# Patient Record
Sex: Female | Born: 1935 | Race: White | Hispanic: No | Smoking: Former smoker
Health system: Southern US, Community
[De-identification: ages and names within clinical notes are randomized; demographics above are authoritative.]

## PROBLEM LIST (undated history)

## (undated) DIAGNOSIS — K219 Gastro-esophageal reflux disease without esophagitis: Secondary | ICD-10-CM

## (undated) DIAGNOSIS — J45909 Unspecified asthma, uncomplicated: Secondary | ICD-10-CM

## (undated) DIAGNOSIS — G894 Chronic pain syndrome: Secondary | ICD-10-CM

## (undated) DIAGNOSIS — I10 Essential (primary) hypertension: Secondary | ICD-10-CM

## (undated) DIAGNOSIS — I2510 Atherosclerotic heart disease of native coronary artery without angina pectoris: Secondary | ICD-10-CM

## (undated) DIAGNOSIS — I6529 Occlusion and stenosis of unspecified carotid artery: Secondary | ICD-10-CM

## (undated) DIAGNOSIS — R300 Dysuria: Secondary | ICD-10-CM

## (undated) DIAGNOSIS — E785 Hyperlipidemia, unspecified: Secondary | ICD-10-CM

## (undated) DIAGNOSIS — Z9889 Other specified postprocedural states: Secondary | ICD-10-CM

## (undated) DIAGNOSIS — C4490 Unspecified malignant neoplasm of skin, unspecified: Secondary | ICD-10-CM

## (undated) DIAGNOSIS — R112 Nausea with vomiting, unspecified: Secondary | ICD-10-CM

## (undated) HISTORY — DX: Hyperlipidemia, unspecified: E78.5

## (undated) HISTORY — DX: Chronic pain syndrome: G89.4

## (undated) HISTORY — DX: Occlusion and stenosis of unspecified carotid artery: I65.29

## (undated) HISTORY — PX: TONSILLECTOMY: SUR1361

## (undated) HISTORY — DX: Atherosclerotic heart disease of native coronary artery without angina pectoris: I25.10

## (undated) HISTORY — DX: Gastro-esophageal reflux disease without esophagitis: K21.9

## (undated) HISTORY — DX: Unspecified asthma, uncomplicated: J45.909

## (undated) HISTORY — PX: CARDIAC CATHETERIZATION: SHX172

## (undated) HISTORY — DX: Essential (primary) hypertension: I10

## (undated) HISTORY — PX: OTHER SURGICAL HISTORY: SHX169

## (undated) HISTORY — DX: Unspecified malignant neoplasm of skin, unspecified: C44.90

---

## 1972-04-25 HISTORY — PX: ABDOMINAL HYSTERECTOMY: SHX81

## 1986-04-25 HISTORY — PX: OTHER SURGICAL HISTORY: SHX169

## 1994-04-25 HISTORY — PX: CORONARY ARTERY BYPASS GRAFT: SHX141

## 1994-06-29 DIAGNOSIS — Z951 Presence of aortocoronary bypass graft: Secondary | ICD-10-CM

## 2008-04-25 HISTORY — PX: CORONARY ARTERY BYPASS GRAFT: SHX141

## 2008-07-09 ENCOUNTER — Encounter: Payer: Self-pay | Admitting: Cardiology

## 2008-07-09 ENCOUNTER — Ambulatory Visit: Payer: Self-pay | Admitting: Cardiology

## 2008-07-09 DIAGNOSIS — E785 Hyperlipidemia, unspecified: Secondary | ICD-10-CM | POA: Insufficient documentation

## 2008-07-09 DIAGNOSIS — R0602 Shortness of breath: Secondary | ICD-10-CM | POA: Insufficient documentation

## 2008-07-09 DIAGNOSIS — E669 Obesity, unspecified: Secondary | ICD-10-CM

## 2008-07-09 DIAGNOSIS — I2510 Atherosclerotic heart disease of native coronary artery without angina pectoris: Secondary | ICD-10-CM

## 2008-07-09 DIAGNOSIS — I10 Essential (primary) hypertension: Secondary | ICD-10-CM

## 2008-07-21 ENCOUNTER — Encounter: Payer: Self-pay | Admitting: Cardiology

## 2008-07-21 ENCOUNTER — Ambulatory Visit: Payer: Self-pay

## 2008-08-27 ENCOUNTER — Encounter: Payer: Self-pay | Admitting: Cardiology

## 2008-08-27 ENCOUNTER — Ambulatory Visit: Payer: Self-pay | Admitting: Cardiology

## 2008-09-02 ENCOUNTER — Ambulatory Visit (HOSPITAL_COMMUNITY): Admission: RE | Admit: 2008-09-02 | Discharge: 2008-09-02 | Payer: Self-pay | Admitting: Cardiology

## 2008-09-02 ENCOUNTER — Ambulatory Visit: Payer: Self-pay | Admitting: Cardiology

## 2008-09-24 ENCOUNTER — Encounter: Payer: Self-pay | Admitting: Cardiology

## 2008-09-24 ENCOUNTER — Ambulatory Visit: Payer: Self-pay | Admitting: Cardiology

## 2008-10-21 ENCOUNTER — Ambulatory Visit: Payer: Self-pay | Admitting: Internal Medicine

## 2008-10-22 ENCOUNTER — Encounter: Payer: Self-pay | Admitting: Cardiology

## 2008-11-06 ENCOUNTER — Encounter: Payer: Self-pay | Admitting: Cardiology

## 2008-12-30 ENCOUNTER — Encounter: Payer: Self-pay | Admitting: Cardiology

## 2008-12-31 ENCOUNTER — Ambulatory Visit: Payer: Self-pay | Admitting: Cardiology

## 2008-12-31 DIAGNOSIS — R0989 Other specified symptoms and signs involving the circulatory and respiratory systems: Secondary | ICD-10-CM | POA: Insufficient documentation

## 2009-02-26 ENCOUNTER — Ambulatory Visit: Payer: Self-pay | Admitting: Cardiology

## 2009-02-26 ENCOUNTER — Ambulatory Visit: Payer: Self-pay

## 2009-03-01 LAB — CONVERTED CEMR LAB
ALT: 23 units/L (ref 0–35)
AST: 22 units/L (ref 0–37)
Alkaline Phosphatase: 65 units/L (ref 39–117)
Bilirubin, Direct: 0 mg/dL (ref 0.0–0.3)
HDL: 46 mg/dL (ref >39.00)
Total Bilirubin: 1.1 mg/dL (ref 0.3–1.2)
Total Protein: 6.7 g/dL (ref 6.0–8.3)

## 2009-03-05 ENCOUNTER — Encounter: Payer: Self-pay | Admitting: Cardiology

## 2010-03-01 ENCOUNTER — Telehealth: Payer: Self-pay | Admitting: Cardiology

## 2010-03-31 ENCOUNTER — Ambulatory Visit: Payer: Self-pay | Admitting: Cardiology

## 2010-03-31 ENCOUNTER — Encounter: Payer: Self-pay | Admitting: Cardiology

## 2010-04-14 ENCOUNTER — Encounter: Payer: Self-pay | Admitting: Cardiology

## 2010-04-15 ENCOUNTER — Encounter: Payer: Self-pay | Admitting: Cardiology

## 2010-04-15 ENCOUNTER — Ambulatory Visit: Payer: Self-pay

## 2010-04-22 ENCOUNTER — Encounter: Payer: Self-pay | Admitting: Cardiology

## 2010-05-25 NOTE — Assessment & Plan Note (Signed)
Summary: [facility]   Visit Type:  Follow-up Primary Provider:  Belva Agee, NP  CC:  CAD and PVD.  History of Present Illness: The patient presents for follow up of her vascular disease.  Since I last saw her she has had no cardiovascular complaints.  She denies chest discomfort, neck or arm discomfort. She has had no shortness of breath, PND orthopnea. She has had no palpitations, presyncope or syncope. She walks a mile most days.  Current Medications (verified): 1)  Triamterene-Hctz 75-50 Mg Tabs (Triamterene-Hctz) .Marland Kitchen.. 1 Once Daily 2)  Promethazine Hcl 25 Mg Tabs (Promethazine Hcl) .... 2 At Bedtime 3)  Citalopram Hydrobromide 20 Mg Tabs (Citalopram Hydrobromide) .Marland Kitchen.. 1 Once Daily 4)  Potassium Chloride Cr 10 Meq Cr-Caps (Potassium Chloride) .... Take One Tablet By Mouth Daily 5)  Naproxen 500 Mg Tabs (Naproxen) .Marland Kitchen.. 1 Bid 6)  Omeprazole 20 Mg Cpdr (Omeprazole) .... 2 Once Daily 7)  Metoprolol Succinate 50 Mg Xr24h-Tab (Metoprolol Succinate) .Marland Kitchen.. 1 Once Daily 8)  Vitamin C 500 Mg  Tabs (Ascorbic Acid) .... 2 Two Times A Day 9)  Multivitamins   Tabs (Multiple Vitamin) .Marland Kitchen.. 1 Two Times A Day 10)  Melatin 3-1 Mg Tabs (Melatonin-Pyridoxine) .... 2 At Bedtime 11)  Fish Oil   Oil (Fish Oil) .... 2000mg  Two Times A Day 12)  Calcium 600/vitamin D 600-400 Mg-Unit Tabs (Calcium Carbonate-Vitamin D) .... 2 Two Times A Day-- Out 13)  Imdur 60 Mg Xr24h-Tab (Isosorbide Mononitrate) .Marland Kitchen.. 1 By Mouth Daily 14)  Claritin 10 Mg Tabs (Loratadine) .... Daily 15)  Tylenol With Codeine #3 300-30 Mg Tabs (Acetaminophen-Codeine) .... Pr N 16)  Amlodipine Besylate 10 Mg Tabs (Amlodipine Besylate) .Marland Kitchen.. 1 By Mouth Daily  Allergies (verified): No Known Drug Allergies  Past History:  Past Medical History: CAD s/p CABG (2010 cath 3 vessel CAD with 2 of 3 bypass grafts occluded.  She had a patent LIMA to the LAD.) Hypertension Hyperlipidemia Skin cancer Carotid stenosis (40 to  59%  bilateral)  Past Surgical History: CABG (LIMA to the LAD, SVG to PDA and SVG to D1 1996) Comment hysterectomy 1974 Bladder resuspension 1988 Tonsillectomy Skin cancer resected  Review of Systems       As stated in the HPI and negative for all other systems.   Vital Signs:  Patient profile:   77 year old female Height:      61 inches Weight:      152 pounds BMI:     28.82 Pulse rate:   67 / minute Resp:     16 per minute BP sitting:   122 / 70  (right arm)  Vitals Entered By: Marrion Coy, CNA (March 31, 2010 10:17 AM)  Physical Exam  General:  Well developed, well nourished, in no acute distress. Head:  normocephalic and atraumatic Eyes:  PERRLA/EOM intact; conjunctiva and lids normal. Neck:  Neck supple, no JVD. No masses, thyromegaly or abnormal cervical nodes. Chest Wall:  well-healed sternotomy scar Lungs:  Clear bilaterally to auscultation and percussion. Abdomen:  Bowel sounds positive; abdomen soft and non-tender without masses, organomegaly, or hernias noted. No hepatosplenomegaly. Msk:  Back normal, normal gait. Muscle strength and tone normal. Extremities:  No clubbing or cyanosis, mild hematoma R groin Neurologic:  Alert and oriented x 3. Skin:  Intact without lesions or rashes. Cervical Nodes:  no significant adenopathy Inguinal Nodes:  no significant adenopathy Psych:  Normal affect.   Detailed Cardiovascular Exam  Neck    Carotids: Carotids  full and equal bilaterally with right bruit    Neck Veins: Normal, no JVD.    Heart    Inspection: no deformities or lifts noted.      Palpation: normal PMI with no thrills palpable.      Auscultation: regular rate and rhythm, S1, S2 without murmurs, rubs, gallops, or clicks.    Vascular    Abdominal Aorta: no palpable masses, pulsations, or audible bruits.      Femoral Pulses: normal femoral pulses bilaterally.      Pedal Pulses: normal pedal pulses bilaterally.      Radial Pulses: normal radial pulses  bilaterally.      Peripheral Circulation: no clubbing, cyanosis, or edema noted with normal capillary refill.     EKG  Procedure date:  03/31/2010  Findings:      at a rate 67, axis within normal limits, intervals within normal limits, no acute ST-T wave changes  Impression & Recommendations:  Problem # 1:  CORONARY ARTERY BYPASS GRAFT, THREE VESSEL, HX OF (ICD-V45.81) The patient has had no new symptoms.  No further testing is indicated.  Problem # 2:  CAROTID BRUIT (ICD-785.9) She cancelled her Doppler scheduled for Nov.  She is in denial about the importance of these issues.  I told her that she risks stroke if she is not compliant with preventive screening and therapy.  Problem # 3:  ESSENTIAL HYPERTENSION, BENIGN (ICD-401.1) Her blood pressure is controlled.  She will continue the meds as listed.  Problem # 4:  HYPERLIPIDEMIA (ICD-272.4) Her LDL is severely out of control but she refuses to try any statin as she has had muscle aches in the past.  I discussed with her the increased risk of stroke and heart attack. She verbalizes understanding.  Other Orders: EKG w/ Interpretation (93000)  Patient Instructions: 1)  Your physician recommends that you schedule a follow-up appointment in: 12 months with Dr Antoine Poche in [place] 2)  Your physician recommends that you continue on your current medications as directed. Please refer to the Current Medication list given to you today. 3)  Your physician has requested that you have a carotid duplex. This test is an ultrasound of the carotid arteries in your neck. It looks at blood flow through these arteries that supply the brain with blood. Allow one hour for this exam. There are no restrictions or special instructions.

## 2010-05-25 NOTE — Progress Notes (Signed)
Summary: pt needs refill  Phone Note Refill Request Call back at [phone] Message from:  Patient on [facility] in [place]  Refills Requested: Medication #1:  ISOSORBIDE pt only got 15pills last time she is out  Initial call taken by: Omer Jack,  March 01, 2010 11:24 AM    Prescriptions: IMDUR 60 MG XR24H-TAB (ISOSORBIDE MONONITRATE) 1 by mouth daily  #15 x 0   Entered by:   Kem Parkinson   Authorized by:   Rollene Rotunda, MD, [facility]   Signed by:   Kem Parkinson on 03/01/2010   Method used:   Electronically to        [facility] [id number][facility] (retail)       [address]       [place], [place]  [address]       Ph: [phone] or [phone]       Fax: [phone]   RxID:   [id number]

## 2010-05-27 NOTE — Miscellaneous (Signed)
Summary: Orders Update  Clinical Lists Changes  Orders: Added new Test order of Carotid Duplex (Carotid Duplex) - Signed 

## 2010-05-27 NOTE — Letter (Signed)
Summary: Generic Letter  Architectural technologist, Main Office  [address]   [place], [place] [address]   Phone: [phone]  Fax: [phone]            April 22, 2010 MRN: [id number]    AISIA CORREIRA [address] [place], [place]  [address]    Dear Ms. Sherk,  I have attempted to reach you by phone however the number I have listed for you has been disconnected.  Please call our office with your most current contact information.  The results of your carotid doppler are stable.  Dr. Antoine Poche would like for you to have this test repeated in 1 year.  You will be contacted to schedule the appointment at a later date.       Sincerely,      Charolotte Capuchin, RN  This letter has been electronically signed by your physician.

## 2010-08-03 LAB — POCT I-STAT 3, ART BLOOD GAS (G3+)
Acid-base deficit: 3 mmol/L — ABNORMAL HIGH (ref 0.0–2.0)
Bicarbonate: 22.3 mEq/L (ref 20.0–24.0)
Patient temperature: 98.6

## 2010-08-03 LAB — POCT I-STAT 3, VENOUS BLOOD GAS (G3P V)
O2 Saturation: 57 %
TCO2: 25 mmol/L (ref 0–100)
pCO2, Ven: 44 mmHg — ABNORMAL LOW (ref 45.0–50.0)

## 2010-09-07 NOTE — Cardiovascular Report (Signed)
[name], [name] NO.:  [id number]   MEDICAL RECORD NO.:  [id number]          PATIENT TYPE:  OIB   LOCATION:  2899                         FACILITY:  [facility]   PHYSICIAN:  Rollene Rotunda, MD, FACCDATE OF BIRTH:  04-09-1931   DATE OF PROCEDURE:  09/02/2008  DATE OF DISCHARGE:                            CARDIAC CATHETERIZATION   PRIMARY CARE PHYSICIAN:  Ernestina Penna, MD   CARDIOLOGIST:  Rollene Rotunda, MD, [facility]   PROCEDURE:  Left and right heart catheterization/coronary angiography.   INDICATIONS:  The patient with dyspnea, previous coronary disease status  post bypass in 1996.  She had an abnormal stress perfusion study  demonstrating a very poor exercise tolerance and anteroseptal wall  ischemia.   PROCEDURE NOTE:  Left heart catheterization was performed via the right  femoral artery, right heart catheterization was performed via the right  femoral vein.  Both vessels were cannulated using the anterior wall  puncture.  A #5-French arterial sheath and a #7-French venous sheath  were inserted via the modified Seldinger technique.  Preformed Judkins,  pigtail, LIMA, and Swan-Ganz catheter were utilized.  The patient  tolerated the procedure well and left the lab in stable condition.   RESULTS:  Hemodynamics:  RA mean 9, RV 45/4, PA 44/10 with a mean of 26,  pulmonary capillary wedge pressure mean 20, AO 137/53, LV 143/23,  cardiac output/cardiac index (Fick) 3.98/2.29.   Coronaries:  The left main had a long calcified 60% lesion.  The LAD had  a long ostial 95% stenosis.  There was mid moderate diffuse disease  prior to the insertion of the LIMA graft.  The distal vessel was not  particularly large but was free of disease.  The circumflex in the AV  groove had proximal luminal irregularities.  There was a long 40%  stenosis after the mid obtuse marginal.  First mid obtuse marginal had  mid 40% stenosis and distal 40% stenosis.  It was a large vessel.  Second obtuse marginal was a moderate-to-small vessel and was normal.  The right coronary artery was a large dominant vessel.  It was occluded  in the mid segment.  There was moderate filling via left-to-right  collaterals from both the circumflex and the LAD.   Grafts:  LIMA to the LAD was widely patent.  The saphenous vein graft to  the PDA was occluded.  Saphenous vein graft to the diagonal was  occluded.   Left ventriculogram:  The left ventriculogram was obtained in the RAO  projection.  The EF was 60% with mild anterior hypokinesis.   CONCLUSION:  Severe three-vessel coronary artery disease.  2/3 grafts  occluded.  Preserved ejection fraction.   PLAN:  The patient will have aggressive medical management and up-  titration of her drugs for management of her dyspnea and severe coronary  disease.  She needs continued aggressive risk reduction.      Rollene Rotunda, MD, [facility]  Electronically Signed     JH/MEDQ  D:  09/02/2008  T:  09/03/2008  Job:  [id number]   cc:   Ernestina Penna, M.D.

## 2010-11-26 ENCOUNTER — Encounter: Payer: Self-pay | Admitting: Cardiology

## 2011-04-04 ENCOUNTER — Other Ambulatory Visit: Payer: Self-pay | Admitting: Cardiology

## 2011-04-04 MED ORDER — ISOSORBIDE MONONITRATE ER 60 MG PO TB24: 60.0000 mg | ORAL_TABLET | Freq: Every day | ORAL | Status: DC

## 2011-04-04 NOTE — Telephone Encounter (Signed)
Patient needs to make an appoinment

## 2011-04-27 ENCOUNTER — Other Ambulatory Visit: Payer: Self-pay | Admitting: *Deleted

## 2011-04-27 DIAGNOSIS — I6529 Occlusion and stenosis of unspecified carotid artery: Secondary | ICD-10-CM

## 2011-04-29 ENCOUNTER — Encounter (INDEPENDENT_AMBULATORY_CARE_PROVIDER_SITE_OTHER): Payer: Medicare Other | Admitting: *Deleted

## 2011-04-29 DIAGNOSIS — I6529 Occlusion and stenosis of unspecified carotid artery: Secondary | ICD-10-CM | POA: Diagnosis not present

## 2011-05-05 ENCOUNTER — Other Ambulatory Visit: Payer: Self-pay | Admitting: Cardiology

## 2011-06-06 ENCOUNTER — Other Ambulatory Visit: Payer: Self-pay | Admitting: Cardiology

## 2011-06-22 ENCOUNTER — Ambulatory Visit (INDEPENDENT_AMBULATORY_CARE_PROVIDER_SITE_OTHER): Payer: Medicare Other | Admitting: Cardiology

## 2011-06-22 ENCOUNTER — Encounter: Payer: Self-pay | Admitting: Cardiology

## 2011-06-22 VITALS — BP 115/51 | HR 73 | Ht 61.0 in | Wt 153.0 lb

## 2011-06-22 DIAGNOSIS — R0989 Other specified symptoms and signs involving the circulatory and respiratory systems: Secondary | ICD-10-CM | POA: Diagnosis not present

## 2011-06-22 DIAGNOSIS — I10 Essential (primary) hypertension: Secondary | ICD-10-CM | POA: Diagnosis not present

## 2011-06-22 DIAGNOSIS — I2510 Atherosclerotic heart disease of native coronary artery without angina pectoris: Secondary | ICD-10-CM

## 2011-06-22 DIAGNOSIS — E785 Hyperlipidemia, unspecified: Secondary | ICD-10-CM

## 2011-06-22 NOTE — Assessment & Plan Note (Signed)
I reviewed her last carotid Doppler. She will have this followed up again in one year. We discussed risk reduction.

## 2011-06-22 NOTE — Patient Instructions (Signed)
The current medical regimen is effective;  continue present plan and medications.  Follow up in 1 year with Dr Hochrein.  You will receive a letter in the mail 2 months before you are due.  Please call us when you receive this letter to schedule your follow up appointment.  

## 2011-06-22 NOTE — Assessment & Plan Note (Signed)
Unfortunately her last LDL was 230. She refuses a statin. I reviewed the outside records and her poor cholesterol profile. She uses a lot of coconut oil for benefits described in the advertising literature. I suspect this is worsening her lipids though she denies this. We have had a long discussion about this and she understands the benefits to statins but chooses not to use these.

## 2011-06-22 NOTE — Assessment & Plan Note (Signed)
She had her cath last in 2010. She has had no new symptoms since then. She will continue with risk reduction. I have encouraged her to start walking for exercise.

## 2011-06-22 NOTE — Progress Notes (Signed)
HPI The patient presents for yearly followup of her coronary disease and peripheral vascular disease. Since I last saw her she has done well.  The patient denies any new symptoms such as chest discomfort, neck or arm discomfort. There has been no new shortness of breath, PND or orthopnea. There have been no reported palpitations, presyncope or syncope.  She has not been exercising as much as I would like. She has had 3 deaths in her family and has been preoccupied issues such as this. However, with her activities of daily living she is denying any new symptoms.  No Known Allergies  Current Outpatient Prescriptions  Medication Sig Dispense Refill  . Acetaminophen-Codeine (TYLENOL/CODEINE #3) 300-30 MG per tablet Take 1 tablet by mouth every 4 (four) hours as needed.        Marland Kitchen amLODipine (NORVASC) 10 MG tablet Take 10 mg by mouth daily.        . Calcium Carbonate-Vitamin D (CALCIUM 600/VITAMIN D) 600-400 MG-UNIT per tablet Take 1 tablet by mouth 2 (two) times daily.        . citalopram (CELEXA) 20 MG tablet Take 20 mg by mouth daily.        . isosorbide mononitrate (IMDUR) 60 MG 24 hr tablet TAKE 1 TABLET (60 MG TOTAL) BY MOUTH DAILY.  30 tablet  0  . loratadine (CLARITIN) 10 MG tablet Take 10 mg by mouth daily.        . Melatonin-Pyridoxine (MELATIN) 3-1 MG TABS Take 2 tablets by mouth at bedtime.        . metoprolol (TOPROL-XL) 50 MG 24 hr tablet Take 50 mg by mouth daily.        . Multiple Vitamin (MULTIVITAMIN) tablet Take 1 tablet by mouth 2 (two) times daily.        . Omega-3 Fatty Acids (FISH OIL) 1000 MG CAPS Take 2 capsules by mouth 2 (two) times daily.        Marland Kitchen omeprazole (PRILOSEC) 20 MG capsule Take 40 mg by mouth daily.        . potassium chloride (K-DUR) 10 MEQ tablet Take 10 mEq by mouth once.      . triamterene-hydrochlorothiazide (MAXZIDE) 75-50 MG per tablet Take 1 tablet by mouth daily.        . vitamin C (ASCORBIC ACID) 500 MG tablet Take 1,000 mg by mouth 2 (two) times  daily.          Past Medical History  Diagnosis Date  . CAD (coronary artery disease)   . HTN (hypertension)   . HLD (hyperlipidemia)   . Skin cancer   . Carotid stenosis     40-59% bilateral    Past Surgical History  Procedure Date  . Coronary artery bypass graft 1996    LIMA to LAD, SVG to PDA and SVG to D1   . Coronary artery bypass graft 2010    cath 2 vessel CAD with 2 of 3 bypass grafts occluded. had a patent LIMA to LAD  . Abdominal hysterectomy 1974  . Baldder resuspension 1988  . Tonsillectomy   . Skin cancer resected     ROS:  As stated in the HPI and negative for all other systems.  PHYSICAL EXAM BP 115/51  Pulse 73  Ht 5\' 1"  (1.549 m)  Wt 153 lb (69.4 kg)  BMI 28.91 kg/m2 GENERAL:  Well appearing HEENT:  Pupils equal round and reactive, fundi not visualized, oral mucosa unremarkable NECK:  No jugular venous distention, waveform within  normal limits, carotid upstroke brisk and symmetric, right carotid and subclavian bruits, no thyromegaly LYMPHATICS:  No cervical, inguinal adenopathy LUNGS:  Clear to auscultation bilaterally BACK:  No CVA tenderness CHEST:  Unremarkable HEART:  PMI not displaced or sustained,S1 and S2 within normal limits, no S3, no S4, no clicks, no rubs, no murmurs ABD:  Flat, positive bowel sounds normal in frequency in pitch, no bruits, no rebound, no guarding, no midline pulsatile mass, no hepatomegaly, no splenomegaly EXT:  2 plus pulses throughout, no edema, no cyanosis no clubbing SKIN:  No rashes no nodules NEURO:  Cranial nerves II through XII grossly intact, motor grossly intact throughout PSYCH:  Cognitively intact, oriented to person place and time  EKG:  Sinus rhythm, rate 73, axis within normal limits, intervals within normal limits, no acute ST-T wave changes.  06/22/2011  ASSESSMENT AND PLAN

## 2011-06-22 NOTE — Assessment & Plan Note (Signed)
The blood pressure is at target. No change in medications is indicated. We will continue with therapeutic lifestyle changes (TLC).  

## 2011-07-01 DIAGNOSIS — I2510 Atherosclerotic heart disease of native coronary artery without angina pectoris: Secondary | ICD-10-CM | POA: Diagnosis not present

## 2011-07-01 DIAGNOSIS — E785 Hyperlipidemia, unspecified: Secondary | ICD-10-CM | POA: Diagnosis not present

## 2011-08-01 DIAGNOSIS — I10 Essential (primary) hypertension: Secondary | ICD-10-CM | POA: Diagnosis not present

## 2011-08-01 DIAGNOSIS — E785 Hyperlipidemia, unspecified: Secondary | ICD-10-CM | POA: Diagnosis not present

## 2011-08-05 DIAGNOSIS — E785 Hyperlipidemia, unspecified: Secondary | ICD-10-CM | POA: Diagnosis not present

## 2011-08-05 DIAGNOSIS — I10 Essential (primary) hypertension: Secondary | ICD-10-CM | POA: Diagnosis not present

## 2011-08-08 DIAGNOSIS — N390 Urinary tract infection, site not specified: Secondary | ICD-10-CM | POA: Diagnosis not present

## 2011-09-20 DIAGNOSIS — J069 Acute upper respiratory infection, unspecified: Secondary | ICD-10-CM | POA: Diagnosis not present

## 2011-10-06 ENCOUNTER — Telehealth: Payer: Self-pay | Admitting: Cardiology

## 2011-10-06 NOTE — Telephone Encounter (Signed)
New Problem:    Patient called in wanting to be seen because she does not feel like herself.  She feels fatigued, some shortness of breath, and has a cough.  Please call back.

## 2011-10-06 NOTE — Telephone Encounter (Signed)
Per pt - states that she has not been feeling well, no energy and some SOB.  Her "get up and go has got up and went" she states.  She reports having been treated recently by Dr Regino Schultze for a respiratory infection.  She will call him back to follow up.

## 2011-10-26 ENCOUNTER — Encounter: Payer: Self-pay | Admitting: Cardiology

## 2011-10-26 ENCOUNTER — Ambulatory Visit (INDEPENDENT_AMBULATORY_CARE_PROVIDER_SITE_OTHER): Payer: Medicare Other | Admitting: Cardiology

## 2011-10-26 VITALS — BP 120/60 | HR 69 | Ht 61.0 in | Wt 155.0 lb

## 2011-10-26 DIAGNOSIS — E785 Hyperlipidemia, unspecified: Secondary | ICD-10-CM

## 2011-10-26 DIAGNOSIS — I10 Essential (primary) hypertension: Secondary | ICD-10-CM | POA: Diagnosis not present

## 2011-10-26 DIAGNOSIS — I2510 Atherosclerotic heart disease of native coronary artery without angina pectoris: Secondary | ICD-10-CM

## 2011-10-26 DIAGNOSIS — R0602 Shortness of breath: Secondary | ICD-10-CM | POA: Diagnosis not present

## 2011-10-26 MED ORDER — PRAVASTATIN SODIUM 40 MG PO TABS: 40.0000 mg | ORAL_TABLET | Freq: Every evening | ORAL | Status: DC

## 2011-10-26 NOTE — Patient Instructions (Addendum)
Please start Pravachol 40 mg a day Continue all other medications as listed.  Please have blood work today (BNP)  Have fasting blood work in 10 weeks at [facility] (Lipid)  Follow up in 6 months with Dr Antoine Poche.  You will receive a letter in the mail 2 months before you are due.  Please call us when you receive this letter to schedule your follow up appointment.

## 2011-10-26 NOTE — Assessment & Plan Note (Signed)
I will check a BNP level. This is markedly elevated I will check an echocardiogram. However, I think dyspnea is probably related to her recent lung problem and deconditioning.

## 2011-10-26 NOTE — Assessment & Plan Note (Signed)
The blood pressure is at target. No change in medications is indicated. We will continue with therapeutic lifestyle changes (TLC).  

## 2011-10-26 NOTE — Progress Notes (Signed)
HPI The patient presents for yearly followup of her coronary disease.  She called last month to report that she was not feeling well. She had an upper respiratory infection and didn't respond well to antibiotics. She had cough congestion and profound fatigue. She had depression. She slowly recovered from this with time and prayer.  He's feeling better now that she has a dry nonproductive cough some dyspnea with activities. She's not describing any new chest pressure, neck or arm discomfort. She's not having any palpitations, presyncope or syncope.   No Known Allergies  Current Outpatient Prescriptions  Medication Sig Dispense Refill  . Acetaminophen-Codeine (TYLENOL/CODEINE #3) 300-30 MG per tablet Take 1 tablet by mouth every 4 (four) hours as needed.        Marland Kitchen amLODipine (NORVASC) 10 MG tablet Take 10 mg by mouth daily.        . Calcium Carbonate-Vitamin D (CALCIUM 600/VITAMIN D) 600-400 MG-UNIT per tablet Take 1 tablet by mouth 2 (two) times daily.        . citalopram (CELEXA) 20 MG tablet Take 20 mg by mouth daily.        . fluticasone (FLONASE) 50 MCG/ACT nasal spray Place 2 sprays into the nose daily.      . isosorbide mononitrate (IMDUR) 60 MG 24 hr tablet TAKE 1 TABLET (60 MG TOTAL) BY MOUTH DAILY.  30 tablet  0  . loratadine (CLARITIN) 10 MG tablet Take 10 mg by mouth daily.        . Melatonin-Pyridoxine (MELATIN) 3-1 MG TABS Take 2 tablets by mouth at bedtime.        . metoprolol (TOPROL-XL) 50 MG 24 hr tablet Take 50 mg by mouth daily.        . Multiple Vitamin (MULTIVITAMIN) tablet Take 1 tablet by mouth 2 (two) times daily.        . Omega-3 Fatty Acids (FISH OIL) 1000 MG CAPS Take 2 capsules by mouth 2 (two) times daily.        Marland Kitchen omeprazole (PRILOSEC) 20 MG capsule Take 40 mg by mouth daily.        . potassium chloride (K-DUR) 10 MEQ tablet Take 10 mEq by mouth once.      . promethazine (PHENERGAN) 25 MG tablet Take 25 mg by mouth every 6 (six) hours as needed. 2 tabs at hs        . triamterene-hydrochlorothiazide (MAXZIDE) 75-50 MG per tablet Take 1 tablet by mouth daily.        . vitamin C (ASCORBIC ACID) 500 MG tablet Take 1,000 mg by mouth 2 (two) times daily.          Past Medical History  Diagnosis Date  . CAD (coronary artery disease)   . HTN (hypertension)   . HLD (hyperlipidemia)   . Skin cancer   . Carotid stenosis     40-59% bilateral    Past Surgical History  Procedure Date  . Coronary artery bypass graft 1996    LIMA to LAD, SVG to PDA and SVG to D1   . Coronary artery bypass graft 2010    cath 2 vessel CAD with 2 of 3 bypass grafts occluded. had a patent LIMA to LAD  . Abdominal hysterectomy 1974  . Baldder resuspension 1988  . Tonsillectomy   . Skin cancer resected     ROS:  As stated in the HPI and negative for all other systems.  PHYSICAL EXAM BP 120/60  Pulse 69  Ht 5'  1" (1.549 m)  Wt 155 lb (70.308 kg)  BMI 29.29 kg/m2 GENERAL:  Well appearing HEENT:  Pupils equal round and reactive, fundi not visualized, oral mucosa unremarkable NECK:  No jugular venous distention, waveform within normal limits, carotid upstroke brisk and symmetric, right carotid and subclavian bruits, no thyromegaly LYMPHATICS:  No cervical, inguinal adenopathy LUNGS:  Clear to auscultation bilaterally BACK:  No CVA tenderness CHEST:  Well healed sternotomy scar. HEART:  PMI not displaced or sustained,S1 and S2 within normal limits, no S3, no S4, no clicks, no rubs, no murmurs ABD:  Flat, positive bowel sounds normal in frequency in pitch, no bruits, no rebound, no guarding, no midline pulsatile mass, no hepatomegaly, no splenomegaly EXT:  2 plus pulses throughout, no edema, no cyanosis no clubbing SKIN:  No rashes no nodules NEURO:  Cranial nerves II through XII grossly intact, motor grossly intact throughout PSYCH:  Cognitively intact, oriented to person place and time  EKG:  Sinus rhythm, rate 69, axis within normal limits, intervals within normal  limits, no acute ST-T wave changes.  10/26/2011  ASSESSMENT AND PLAN

## 2011-10-26 NOTE — Assessment & Plan Note (Signed)
I have reviewed with her that her last LDL in April was 158. She didn't respond well to Lipitor and hasn't wanted to take other statins. She agrees to a least take pravastatin 40 mg daily as we can repeat lipids in 8 weeks.

## 2011-10-26 NOTE — Assessment & Plan Note (Signed)
The patient has no new sypmtoms.  No further cardiovascular testing is indicated.  We will continue with aggressive risk reduction and meds as listed.  

## 2011-11-04 DIAGNOSIS — I2510 Atherosclerotic heart disease of native coronary artery without angina pectoris: Secondary | ICD-10-CM | POA: Diagnosis not present

## 2011-11-04 DIAGNOSIS — I10 Essential (primary) hypertension: Secondary | ICD-10-CM | POA: Diagnosis not present

## 2011-11-08 ENCOUNTER — Telehealth: Payer: Self-pay | Admitting: *Deleted

## 2011-11-08 NOTE — Telephone Encounter (Signed)
Pt aware and denied any further questions or needs

## 2011-11-08 NOTE — Telephone Encounter (Signed)
Will make pt aware.   

## 2011-11-08 NOTE — Telephone Encounter (Signed)
Message copied by Sharin Grave on Tue Nov 08, 2011  9:53 AM ------      Message from: Rollene Rotunda      Created: Fri Oct 28, 2011  5:01 PM       The patient had only a mildly elevated BNP.  I doubt that her dyspnea is related to CHF.  No further work up is indicated.

## 2012-01-04 ENCOUNTER — Encounter: Payer: Self-pay | Admitting: Cardiology

## 2012-01-04 DIAGNOSIS — Z79899 Other long term (current) drug therapy: Secondary | ICD-10-CM | POA: Diagnosis not present

## 2012-01-04 DIAGNOSIS — E7800 Pure hypercholesterolemia, unspecified: Secondary | ICD-10-CM | POA: Diagnosis not present

## 2012-01-04 DIAGNOSIS — E785 Hyperlipidemia, unspecified: Secondary | ICD-10-CM | POA: Diagnosis not present

## 2012-01-09 ENCOUNTER — Other Ambulatory Visit: Payer: Self-pay

## 2012-01-09 ENCOUNTER — Telehealth: Payer: Self-pay

## 2012-01-09 DIAGNOSIS — I25810 Atherosclerosis of coronary artery bypass graft(s) without angina pectoris: Secondary | ICD-10-CM

## 2012-01-09 DIAGNOSIS — E785 Hyperlipidemia, unspecified: Secondary | ICD-10-CM

## 2012-01-09 MED ORDER — PRAVASTATIN SODIUM 80 MG PO TABS: 80.0000 mg | ORAL_TABLET | Freq: Every evening | ORAL | Status: DC

## 2012-01-09 NOTE — Telephone Encounter (Signed)
Message copied by Yolonda Kida on Mon Jan 09, 2012  9:46 AM ------      Message from: Rollene Rotunda      Created: Mon Jan 09, 2012  8:27 AM       Her LDL is much better than it was but still not at target.  Would she agree to double the pravachol to 80 ,mg daily?  Repeat lipid and liver in 10 weeks.  Call Ms. Elige Radon with the results and send results to Redmond Baseman, MD

## 2012-01-09 NOTE — Telephone Encounter (Signed)
Patient aware of lab results and to increase her pravachlol to 80 mg, sent new rx

## 2012-01-09 NOTE — Telephone Encounter (Signed)
..   Requested Prescriptions   Signed Prescriptions Disp Refills  . pravastatin (PRAVACHOL) 80 MG tablet 90 tablet 3    Sig: Take 1 tablet (80 mg total) by mouth every evening.    Authorizing Provider: Rollene Rotunda    Ordering User: Neyla Gauntt M  change to 80 mg from 40 mg per Dr Antoine Poche

## 2012-01-12 DIAGNOSIS — Z23 Encounter for immunization: Secondary | ICD-10-CM | POA: Diagnosis not present

## 2012-01-26 ENCOUNTER — Telehealth: Payer: Self-pay | Admitting: Cardiology

## 2012-01-26 NOTE — Telephone Encounter (Signed)
plz return call to patient [phone], patient feels like she is having some side affects from medication, doesn't feel good, nauseated.  Pt has stopped taking her medication plz return call ASAP.

## 2012-01-26 NOTE — Telephone Encounter (Signed)
Left message to call back  

## 2012-01-30 DIAGNOSIS — I2510 Atherosclerotic heart disease of native coronary artery without angina pectoris: Secondary | ICD-10-CM | POA: Diagnosis not present

## 2012-01-30 DIAGNOSIS — I10 Essential (primary) hypertension: Secondary | ICD-10-CM | POA: Diagnosis not present

## 2012-01-30 NOTE — Telephone Encounter (Signed)
Spoke with pt who states the Pravastatin was causing her to have a decrease in urine flow and felt nauseous/stomach upset.  States she felt slighty nauseated on 40 mg and did not want to go back to the 40 mg either.  She reports "I just don't like statin drug" "I just don't want to take them"  Explained to pt the reason for the use of statin medications which she conveyed understanding of but does not wish to take them any longer.  Will inform Dr Antoine Poche.

## 2012-02-03 DIAGNOSIS — C44721 Squamous cell carcinoma of skin of unspecified lower limb, including hip: Secondary | ICD-10-CM | POA: Diagnosis not present

## 2012-02-03 DIAGNOSIS — D485 Neoplasm of uncertain behavior of skin: Secondary | ICD-10-CM | POA: Diagnosis not present

## 2012-02-16 DIAGNOSIS — C44319 Basal cell carcinoma of skin of other parts of face: Secondary | ICD-10-CM | POA: Diagnosis not present

## 2012-02-16 DIAGNOSIS — L570 Actinic keratosis: Secondary | ICD-10-CM | POA: Diagnosis not present

## 2012-02-16 DIAGNOSIS — C44721 Squamous cell carcinoma of skin of unspecified lower limb, including hip: Secondary | ICD-10-CM | POA: Diagnosis not present

## 2012-03-19 DIAGNOSIS — L739 Follicular disorder, unspecified: Secondary | ICD-10-CM | POA: Diagnosis not present

## 2012-03-19 DIAGNOSIS — Z85828 Personal history of other malignant neoplasm of skin: Secondary | ICD-10-CM | POA: Diagnosis not present

## 2012-05-10 DIAGNOSIS — N189 Chronic kidney disease, unspecified: Secondary | ICD-10-CM | POA: Diagnosis not present

## 2012-05-10 DIAGNOSIS — I2510 Atherosclerotic heart disease of native coronary artery without angina pectoris: Secondary | ICD-10-CM | POA: Diagnosis not present

## 2012-05-10 DIAGNOSIS — I10 Essential (primary) hypertension: Secondary | ICD-10-CM | POA: Diagnosis not present

## 2012-05-10 DIAGNOSIS — E785 Hyperlipidemia, unspecified: Secondary | ICD-10-CM | POA: Diagnosis not present

## 2012-05-17 ENCOUNTER — Encounter (INDEPENDENT_AMBULATORY_CARE_PROVIDER_SITE_OTHER): Payer: Medicare Other

## 2012-05-17 DIAGNOSIS — I6529 Occlusion and stenosis of unspecified carotid artery: Secondary | ICD-10-CM | POA: Diagnosis not present

## 2012-05-22 DIAGNOSIS — E871 Hypo-osmolality and hyponatremia: Secondary | ICD-10-CM | POA: Diagnosis not present

## 2012-05-22 DIAGNOSIS — E785 Hyperlipidemia, unspecified: Secondary | ICD-10-CM | POA: Diagnosis not present

## 2012-06-21 DIAGNOSIS — E871 Hypo-osmolality and hyponatremia: Secondary | ICD-10-CM | POA: Diagnosis not present

## 2012-06-21 DIAGNOSIS — N189 Chronic kidney disease, unspecified: Secondary | ICD-10-CM | POA: Diagnosis not present

## 2012-06-21 DIAGNOSIS — E785 Hyperlipidemia, unspecified: Secondary | ICD-10-CM | POA: Diagnosis not present

## 2012-06-21 DIAGNOSIS — I10 Essential (primary) hypertension: Secondary | ICD-10-CM | POA: Diagnosis not present

## 2012-08-21 ENCOUNTER — Ambulatory Visit (INDEPENDENT_AMBULATORY_CARE_PROVIDER_SITE_OTHER): Payer: Medicare Other

## 2012-08-21 ENCOUNTER — Encounter: Payer: Self-pay | Admitting: Family Medicine

## 2012-08-21 ENCOUNTER — Ambulatory Visit (INDEPENDENT_AMBULATORY_CARE_PROVIDER_SITE_OTHER): Payer: Medicare Other | Admitting: Family Medicine

## 2012-08-21 VITALS — BP 115/66 | HR 70 | Temp 97.7°F | Wt 154.8 lb

## 2012-08-21 DIAGNOSIS — I10 Essential (primary) hypertension: Secondary | ICD-10-CM | POA: Diagnosis not present

## 2012-08-21 DIAGNOSIS — I2510 Atherosclerotic heart disease of native coronary artery without angina pectoris: Secondary | ICD-10-CM | POA: Diagnosis not present

## 2012-08-21 DIAGNOSIS — R05 Cough: Secondary | ICD-10-CM

## 2012-08-21 DIAGNOSIS — R0989 Other specified symptoms and signs involving the circulatory and respiratory systems: Secondary | ICD-10-CM

## 2012-08-21 DIAGNOSIS — R0609 Other forms of dyspnea: Secondary | ICD-10-CM

## 2012-08-21 DIAGNOSIS — Z951 Presence of aortocoronary bypass graft: Secondary | ICD-10-CM | POA: Diagnosis not present

## 2012-08-21 DIAGNOSIS — E785 Hyperlipidemia, unspecified: Secondary | ICD-10-CM | POA: Diagnosis not present

## 2012-08-21 DIAGNOSIS — R0600 Dyspnea, unspecified: Secondary | ICD-10-CM

## 2012-08-21 DIAGNOSIS — R059 Cough, unspecified: Secondary | ICD-10-CM

## 2012-08-21 DIAGNOSIS — E669 Obesity, unspecified: Secondary | ICD-10-CM

## 2012-08-21 LAB — POCT CBC
Granulocyte percent: 62.4 %G (ref 37–80)
HCT, POC: 39.9 % (ref 37.7–47.9)
Hemoglobin: 13.1 g/dL (ref 12.2–16.2)
Lymph, poc: 2.2 (ref 0.6–3.4)
MCH, POC: 30.1 pg (ref 27–31.2)
MCHC: 32.9 g/dL (ref 31.8–35.4)
MCV: 91.3 fL (ref 80–97)
MPV: 6.8 fL (ref 0–99.8)
POC Granulocyte: 4.5 (ref 2–6.9)
POC LYMPH PERCENT: 30.5 %L (ref 10–50)
Platelet Count, POC: 274 10*3/uL (ref 142–424)
RBC: 4.4 M/uL (ref 4.04–5.48)
RDW, POC: 13.2 %
WBC: 7.2 10*3/uL (ref 4.6–10.2)

## 2012-08-21 MED ORDER — ACETAMINOPHEN-CODEINE 300-30 MG PO TABS: 1.0000 | ORAL_TABLET | ORAL | Status: DC | PRN

## 2012-08-21 MED ORDER — OMEPRAZOLE 20 MG PO CPDR: 40.0000 mg | DELAYED_RELEASE_CAPSULE | Freq: Every day | ORAL | Status: DC

## 2012-08-21 NOTE — Progress Notes (Signed)
Patient ID: [name], female   DOB: 1930/05/24, 79 y.o.   MRN: [id number] SUBJECTIVE: HPI: Exertional dyspnea is chronic. Not evaluated in a while by Dr Antoine Poche. Has noticed more  Exertional dyspnea.  Past Medical History  Diagnosis Date  . CAD (coronary artery disease)   . HTN (hypertension)   . HLD (hyperlipidemia)   . Skin cancer   . Carotid stenosis     40-59% bilateral   Past Surgical History  Procedure Laterality Date  . Coronary artery bypass graft  1996    LIMA to LAD, SVG to PDA and SVG to D1   . Coronary artery bypass graft  2010    cath 2 vessel CAD with 2 of 3 bypass grafts occluded. had a patent LIMA to LAD  . Abdominal hysterectomy  1974  . Baldder resuspension  1988  . Tonsillectomy    . Skin cancer resected     History   Social History  . Marital Status: Single    Spouse Name: N/A    Number of Children: N/A  . Years of Education: N/A   Occupational History  . Not on file.   Social History Main Topics  . Smoking status: Former Smoker    Quit date: 10/25/1977  . Smokeless tobacco: Not on file     Comment: smoked 2ppd for 15 years; quit in 1979  . Alcohol Use: Not on file  . Drug Use: Not on file  . Sexually Active: Not on file   Other Topics Concern  . 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    Family History  Problem Relation Age of Onset  . Heart disease Brother     multiple; ealry onset  . Heart disease Sister     multiple; early onset    Current Outpatient Prescriptions on File Prior to Visit  Medication Sig Dispense Refill  . amLODipine (NORVASC) 10 MG tablet Take 10 mg by mouth daily.        . Calcium Carbonate-Vitamin D (CALCIUM 600/VITAMIN D) 600-400 MG-UNIT per tablet Take 1 tablet by mouth 2 (two) times daily.        . citalopram (CELEXA) 20 MG tablet Take 20 mg by mouth daily.        . isosorbide mononitrate (IMDUR) 60 MG 24 hr tablet TAKE 1  TABLET (60 MG TOTAL) BY MOUTH DAILY.  30 tablet  0  . loratadine (CLARITIN) 10 MG tablet Take 10 mg by mouth daily.        . Melatonin-Pyridoxine (MELATIN) 3-1 MG TABS Take 2 tablets by mouth at bedtime.        . metoprolol (TOPROL-XL) 50 MG 24 hr tablet Take 50 mg by mouth daily.        . Multiple Vitamin (MULTIVITAMIN) tablet Take 1 tablet by mouth 2 (two) times daily.        . Omega-3 Fatty Acids (FISH OIL) 1000 MG CAPS Take 2 capsules by mouth 2 (two) times daily.        . potassium chloride (K-DUR) 10 MEQ tablet Take 10 mEq by mouth once.      . promethazine (PHENERGAN) 25 MG tablet Take 25 mg by mouth every 6 (six) hours as needed. 2 tabs at hs      . triamterene-hydrochlorothiazide (MAXZIDE) 75-50 MG per tablet Take 1 tablet by mouth daily.        Marland Kitchen  vitamin C (ASCORBIC ACID) 500 MG tablet Take 1,000 mg by mouth 2 (two) times daily.        . fluticasone (FLONASE) 50 MCG/ACT nasal spray Place 2 sprays into the nose daily.       No current facility-administered medications on file prior to visit.   Allergies  Allergen Reactions  . Lisinopril   . Lunesta (Eszopiclone)   . Simvastatin   . Statins     lipitor crestor  . Ultram (Tramadol)     There is no immunization history on file for this patient. Prior to Admission medications   Medication Sig Start Date End Date Taking? Authorizing Provider  Acetaminophen-Codeine (TYLENOL/CODEINE #3) 300-30 MG per tablet Take 1 tablet by mouth every 4 (four) hours as needed. 08/21/12  Yes Ileana Ladd, MD  amLODipine (NORVASC) 10 MG tablet Take 10 mg by mouth daily.     Yes Historical Provider, MD  Calcium Carbonate-Vitamin D (CALCIUM 600/VITAMIN D) 600-400 MG-UNIT per tablet Take 1 tablet by mouth 2 (two) times daily.     Yes Historical Provider, MD  citalopram (CELEXA) 20 MG tablet Take 20 mg by mouth daily.     Yes Historical Provider, MD  fenofibrate 54 MG tablet Take 54 mg by mouth daily.  08/15/12  Yes Historical Provider, MD  isosorbide  mononitrate (IMDUR) 60 MG 24 hr tablet TAKE 1 TABLET (60 MG TOTAL) BY MOUTH DAILY. 05/05/11  Yes Tonny Bollman, MD  loratadine (CLARITIN) 10 MG tablet Take 10 mg by mouth daily.     Yes Historical Provider, MD  Melatonin-Pyridoxine (MELATIN) 3-1 MG TABS Take 2 tablets by mouth at bedtime.     Yes Historical Provider, MD  metoprolol (TOPROL-XL) 50 MG 24 hr tablet Take 50 mg by mouth daily.     Yes Historical Provider, MD  Multiple Vitamin (MULTIVITAMIN) tablet Take 1 tablet by mouth 2 (two) times daily.     Yes Historical Provider, MD  Omega-3 Fatty Acids (FISH OIL) 1000 MG CAPS Take 2 capsules by mouth 2 (two) times daily.     Yes Historical Provider, MD  omeprazole (PRILOSEC) 20 MG capsule Take 2 capsules (40 mg total) by mouth daily. 08/21/12  Yes Ileana Ladd, MD  potassium chloride (K-DUR) 10 MEQ tablet Take 10 mEq by mouth once.   Yes Historical Provider, MD  potassium chloride (K-DUR,KLOR-CON) 10 MEQ tablet  08/20/12  Yes Historical Provider, MD  promethazine (PHENERGAN) 25 MG tablet Take 25 mg by mouth every 6 (six) hours as needed. 2 tabs at hs   Yes Historical Provider, MD  triamterene-hydrochlorothiazide (MAXZIDE) 75-50 MG per tablet Take 1 tablet by mouth daily.     Yes Historical Provider, MD  vitamin C (ASCORBIC ACID) 500 MG tablet Take 1,000 mg by mouth 2 (two) times daily.     Yes Historical Provider, MD  fluticasone (FLONASE) 50 MCG/ACT nasal spray Place 2 sprays into the nose daily.    Historical Provider, MD  viewed/updated in Epic  ROS: As above in the HPI. All other systems are stable or negative.  OBJECTIVE: APPEARANCE:  Patient in no acute distress.The patient appeared well nourished and normally developed. Acyanotic. Waist: VITAL SIGNS:BP 115/66  Pulse 70  Temp(Src) 97.7 F (36.5 C) (Oral)  Wt 154 lb 12.8 oz (70.217 kg)  BMI 29.26 kg/m2  SpO2 90%   SKIN: warm and  Dry without overt rashes, tattoos and scars  HEAD and Neck: without JVD, Head and scalp:  normal Eyes:No scleral icterus.  Fundi normal, eye movements normal. Ears: Auricle normal, canal normal, Tympanic membranes normal, insufflation normal. Nose: normal Throat: normal Neck & thyroid: normal  CHEST & LUNGS: Chest wall: normal Lungs: Clear  CVS: Reveals the PMI to be normally located. Regular rhythm, First and Second Heart sounds are normal,  absence of murmurs, rubs or gallops. Peripheral vasculature: Radial pulses: normal Dorsal pedis pulses: normal Posterior pulses: normal  ABDOMEN:  Appearance: normal Benign,, no organomegaly, no masses, no Abdominal Aortic enlargement. No Guarding , no rebound. No Bruits. Bowel sounds: normal  RECTAL: N/A GU: N/A  EXTREMETIES: nonedematous. Both Femoral and Pedal pulses are normal. Crepitus of knees.  NEUROLOGIC: oriented to time,place and person; nonfocal. Strength is normal  ASSESSMENT: Dyspnea - Plan: EKG 12-Lead, DG Chest 2 View, Brain natriuretic peptide  Cough - Plan: Brain natriuretic peptide  CAD - Plan: NMR Lipoprofile with Lipids, POCT CBC  CAROTID BRUIT  CORONARY ARTERY BYPASS GRAFT, THREE VESSEL, HX OF - Plan: POCT CBC  Essential hypertension, benign - Plan: POCT CBC  HYPERLIPIDEMIA - Plan: NMR Lipoprofile with Lipids, COMPLETE METABOLIC PANEL WITH GFR  Obesity, unspecified   PLAN: Early appointment with Dr Antoine Poche? Patient has an appointment for May 28th. Meds ordered this encounter  Medications  . fenofibrate 54 MG tablet    Sig: Take 54 mg by mouth daily.   . potassium chloride (K-DUR,KLOR-CON) 10 MEQ tablet    Sig:   . Acetaminophen-Codeine (TYLENOL/CODEINE #3) 300-30 MG per tablet    Sig: Take 1 tablet by mouth every 4 (four) hours as needed.    Dispense:  60 tablet    Refill:  2  . omeprazole (PRILOSEC) 20 MG capsule    Sig: Take 2 capsules (40 mg total) by mouth daily.    Dispense:  60 capsule    Refill:  5   Orders Placed This Encounter  Procedures  . DG Chest 2 View     Standing Status: Future     Number of Occurrences: 1     Standing Expiration Date: 10/21/2013    Order Specific Question:  Reason for Exam (SYMPTOM  OR DIAGNOSIS REQUIRED)    Answer:  dyspnea    Order Specific Question:  Reason for Exam (SYMPTOM  OR DIAGNOSIS REQUIRED)    Answer:  cad    Order Specific Question:  Preferred imaging location?    Answer:  Internal  . NMR Lipoprofile with Lipids  . COMPLETE METABOLIC PANEL WITH GFR  . Brain natriuretic peptide  . POCT CBC  . EKG 12-Lead   Reviewed meds. [facility] reading (PRIMARY) by  Dr. Modesto Charon: Chronic  Changes. No chf seen, no acute findings.                                RtC 3 months.  Danil Wedge P. Modesto Charon, M.D.

## 2012-08-22 LAB — NMR LIPOPROFILE WITH LIPIDS
Cholesterol, Total: 307 mg/dL — ABNORMAL HIGH (ref <200)
HDL Particle Number: 43.8 umol/L (ref >=30.5)
HDL Size: 9.6 nm (ref >=9.2)
HDL-C: 80 mg/dL (ref >=40)
LDL (calc): 199 mg/dL — ABNORMAL HIGH (ref <100)
LDL Particle Number: 2113 nmol/L — ABNORMAL HIGH (ref <1000)
LDL Size: 22.1 nm (ref >20.5)
LP-IR Score: 32 (ref <=45)
Large HDL-P: 10.6 umol/L (ref >=4.8)
Large VLDL-P: 2.4 nmol/L (ref <=2.7)
Small LDL Particle Number: 347 nmol/L (ref <=527)
Triglycerides: 142 mg/dL (ref <150)
VLDL Size: 54.2 nm — ABNORMAL HIGH (ref <=46.6)

## 2012-08-22 LAB — COMPLETE METABOLIC PANEL WITH GFR
ALT: 29 U/L (ref 0–35)
AST: 29 U/L (ref 0–37)
Albumin: 4.3 g/dL (ref 3.5–5.2)
Alkaline Phosphatase: 50 U/L (ref 39–117)
BUN: 47 mg/dL — ABNORMAL HIGH (ref 6–23)
CO2: 25 mEq/L (ref 19–32)
Calcium: 10.4 mg/dL (ref 8.4–10.5)
Chloride: 99 mEq/L (ref 96–112)
Creat: 1.5 mg/dL — ABNORMAL HIGH (ref 0.50–1.10)
GFR, Est African American: 38 mL/min — ABNORMAL LOW
GFR, Est Non African American: 33 mL/min — ABNORMAL LOW
Glucose, Bld: 82 mg/dL (ref 70–99)
Potassium: 3.8 mEq/L (ref 3.5–5.3)
Sodium: 137 mEq/L (ref 135–145)
Total Bilirubin: 0.3 mg/dL (ref 0.3–1.2)
Total Protein: 7.1 g/dL (ref 6.0–8.3)

## 2012-08-23 LAB — BRAIN NATRIURETIC PEPTIDE

## 2012-08-23 NOTE — Addendum Note (Signed)
Addended by: Tommas Olp on: 08/23/2012 12:12 PM   Modules accepted: Orders

## 2012-08-29 ENCOUNTER — Other Ambulatory Visit (INDEPENDENT_AMBULATORY_CARE_PROVIDER_SITE_OTHER): Payer: Medicare Other

## 2012-08-29 DIAGNOSIS — R0602 Shortness of breath: Secondary | ICD-10-CM | POA: Diagnosis not present

## 2012-08-29 DIAGNOSIS — I2510 Atherosclerotic heart disease of native coronary artery without angina pectoris: Secondary | ICD-10-CM

## 2012-08-29 NOTE — Progress Notes (Signed)
Patient here for labs only. 

## 2012-08-30 LAB — BRAIN NATRIURETIC PEPTIDE: Brain Natriuretic Peptide: 111.5 pg/mL — ABNORMAL HIGH (ref 0.0–100.0)

## 2012-08-30 NOTE — Progress Notes (Signed)
Quick Note:  Call patient. Labs normal. No change in plan. ______ 

## 2012-09-16 ENCOUNTER — Other Ambulatory Visit: Payer: Self-pay | Admitting: Family Medicine

## 2012-09-19 ENCOUNTER — Ambulatory Visit (INDEPENDENT_AMBULATORY_CARE_PROVIDER_SITE_OTHER): Payer: Medicare Other | Admitting: Cardiology

## 2012-09-19 ENCOUNTER — Encounter: Payer: Self-pay | Admitting: Cardiology

## 2012-09-19 ENCOUNTER — Telehealth: Payer: Self-pay | Admitting: Internal Medicine

## 2012-09-19 VITALS — BP 127/56 | HR 67 | Ht 61.0 in | Wt 157.0 lb

## 2012-09-19 DIAGNOSIS — Z951 Presence of aortocoronary bypass graft: Secondary | ICD-10-CM

## 2012-09-19 DIAGNOSIS — E669 Obesity, unspecified: Secondary | ICD-10-CM | POA: Diagnosis not present

## 2012-09-19 DIAGNOSIS — I10 Essential (primary) hypertension: Secondary | ICD-10-CM

## 2012-09-19 DIAGNOSIS — R0602 Shortness of breath: Secondary | ICD-10-CM | POA: Diagnosis not present

## 2012-09-19 DIAGNOSIS — R0989 Other specified symptoms and signs involving the circulatory and respiratory systems: Secondary | ICD-10-CM

## 2012-09-19 NOTE — Progress Notes (Signed)
HPI The patient presents for yearly followup of dysnpea. She has had this for some time and it is getting slightly worse.  She is not describing PND or orthopnea. She continues to have a dry nonproductive cough. She's not had any weight gain or edema. She's had none of the chest discomfort more acute symptoms that was her previous angina. She was evaluated with catheterization in 2010. She did have pulmonary function tests and I do note that she had an abnormal diffusing capacity but no evidence of obstructive restrictive disease. Chest x-ray recently was normal. However, she reports that an oxygen level taken off her daughter's house with a pulse oximeter was as low as 81% recently. [place]. Apparently there was no need for acute intervention and the patient wasn't in distress when taken. Here she reports that her primary care and we checked it and it was 91%. Today in the office it was she did drop to 86% with ambulation starting at 97%. Of note her BNP was only very slightly elevated recently.  She's not describing any new chest pressure, neck or arm discomfort. She's not having any palpitations, presyncope or syncope.   Allergies  Allergen Reactions  . Lisinopril   . Lunesta (Eszopiclone)   . Simvastatin   . Statins     lipitor crestor  . Ultram (Tramadol)     Current Outpatient Prescriptions  Medication Sig Dispense Refill  . Acetaminophen-Codeine (TYLENOL/CODEINE #3) 300-30 MG per tablet Take 1 tablet by mouth every 4 (four) hours as needed.  60 tablet  2  . amLODipine (NORVASC) 10 MG tablet Take 10 mg by mouth daily.        . Calcium Carbonate-Vitamin D (CALCIUM 600/VITAMIN D) 600-400 MG-UNIT per tablet Take 1 tablet by mouth 2 (two) times daily.        . citalopram (CELEXA) 20 MG tablet Take 20 mg by mouth daily.        . fenofibrate 54 MG tablet Take 54 mg by mouth daily.       . fluticasone (FLONASE) 50 MCG/ACT nasal spray Place 2 sprays into the nose daily.      .  isosorbide mononitrate (IMDUR) 60 MG 24 hr tablet TAKE ONE TABLET BY MOUTH EVERY DAY  30 tablet  4  . loratadine (CLARITIN) 10 MG tablet Take 10 mg by mouth daily.        . Melatonin-Pyridoxine (MELATIN) 3-1 MG TABS Take 2 tablets by mouth at bedtime.        . metoprolol (TOPROL-XL) 50 MG 24 hr tablet Take 50 mg by mouth daily.        . Multiple Vitamin (MULTIVITAMIN) tablet Take 1 tablet by mouth 2 (two) times daily.        . Omega-3 Fatty Acids (FISH OIL) 1000 MG CAPS Take 2 capsules by mouth 2 (two) times daily.        Marland Kitchen omeprazole (PRILOSEC) 20 MG capsule Take 2 capsules (40 mg total) by mouth daily.  60 capsule  5  . potassium chloride (K-DUR) 10 MEQ tablet Take 10 mEq by mouth once.      . promethazine (PHENERGAN) 25 MG tablet Take 25 mg by mouth every 6 (six) hours as needed. 2 tabs at hs      . triamterene-hydrochlorothiazide (MAXZIDE) 75-50 MG per tablet Take 1 tablet by mouth daily.        . vitamin C (ASCORBIC ACID) 500 MG tablet Take 1,000 mg by mouth  2 (two) times daily.         No current facility-administered medications for this visit.    Past Medical History  Diagnosis Date  . CAD (coronary artery disease)   . HTN (hypertension)   . HLD (hyperlipidemia)   . Skin cancer   . Carotid stenosis     40-59% bilateral    Past Surgical History  Procedure Laterality Date  . Coronary artery bypass graft  1996    LIMA to LAD, SVG to PDA and SVG to D1   . Coronary artery bypass graft  2010    cath 2 vessel CAD with 2 of 3 bypass grafts occluded. had a patent LIMA to LAD  . Abdominal hysterectomy  1974  . Baldder resuspension  1988  . Tonsillectomy    . Skin cancer resected      ROS:  As stated in the HPI and negative for all other systems.  PHYSICAL EXAM BP 127/56  Pulse 67  Ht 5\' 1"  (1.549 m)  Wt 157 lb (71.215 kg)  BMI 29.68 kg/m2  SpO2 96% GENERAL:  Well appearing HEENT:  Pupils equal round and reactive, fundi not visualized, oral mucosa unremarkable NECK:  No  jugular venous distention, waveform within normal limits, carotid upstroke brisk and symmetric, right carotid and subclavian bruits, no thyromegaly LYMPHATICS:  No cervical, inguinal adenopathy LUNGS:  Clear to auscultation bilaterally BACK:  No CVA tenderness, diffuse fine crackles throughout the lung fields CHEST:  Well healed sternotomy scar. HEART:  PMI not displaced or sustained,S1 and S2 within normal limits, no S3, no S4, no clicks, no rubs, no murmurs ABD:  Flat, positive bowel sounds normal in frequency in pitch, no bruits, no rebound, no guarding, no midline pulsatile mass, no hepatomegaly, no splenomegaly EXT:  2 plus pulses throughout, no edema, no cyanosis no clubbing SKIN:  No rashes no nodules NEURO:  Cranial nerves II through XII grossly intact, motor grossly intact throughout PSYCH:  Cognitively intact, oriented to person place and time  EKG:  Sinus rhythm, rate 69, axis within normal limits, intervals within normal limits, no acute ST-T wave changes.  09/19/2012  ASSESSMENT AND PLAN  CAD:  I do not think her symptoms are an anginal equivalent. I am not at this point the remaining ischemia workup. However, I will proceed as below  DYSPNEA:  On exam now I hear some fine crackles. I note the reduced diffusing capacity from previous pulmonary function testing. She may have some element of pulmonary fibrosis and I will get her to see pulmonary.  CAROTID STENOSIS:  This was 40-59% bilateral stenosis in January. He will be followed again in January 2015.  HTN:  The blood pressure is at target. No change in medications is indicated. We will continue with therapeutic lifestyle changes (TLC).

## 2012-09-19 NOTE — Patient Instructions (Addendum)
The current medical regimen is effective;  continue present plan and medications.  You have been referred to Dr Fannie Knee ([facility]) [address]  Follow up in 6 months with Dr Antoine Poche.  You will receive a letter in the mail 2 months before you are due.  Please call us when you receive this letter to schedule your follow up appointment.

## 2012-09-19 NOTE — Telephone Encounter (Signed)
[facility] [place] office and they were closed for lunch.  Will try back.

## 2012-09-20 NOTE — Telephone Encounter (Signed)
Spoke with Pam  She states that they wanted the pt seen yesterday b/c she has transportation issues She states that they would like to seen asap due to worsening DOE  There are some blocked spots on CDY schedule tomorrow Can we please add on this pt? Thanks!

## 2012-09-20 NOTE — Telephone Encounter (Signed)
Pt's daughter Lynnell Dike) called requesting we call her back with the info regarding appt @ [phone]. Leanora Ivanoff

## 2012-09-24 NOTE — Telephone Encounter (Signed)
Spoke with [name]-- Patient still needing appt w CY. Requesting an afternoon appt. Dr. Maple Hudson please advise if patient can be worked in this week, thank you

## 2012-09-24 NOTE — Telephone Encounter (Signed)
Unfortunately we do not have any openings with CY-is there a particular reason they are requesting CY. MW and RA have added times this week. Thanks.

## 2012-09-24 NOTE — Telephone Encounter (Signed)
I called # listed was advised to call [phone] to speak with Pam.  Per Pam pt can see anyone.  I called pt to scheduled appt. I offered MW and sh estates she was advised not to see him. She stated she will have her daughter call to schedule appt. I will close note at this time.

## 2012-10-16 ENCOUNTER — Ambulatory Visit (INDEPENDENT_AMBULATORY_CARE_PROVIDER_SITE_OTHER): Payer: Medicare Other | Admitting: Pulmonary Disease

## 2012-10-16 ENCOUNTER — Encounter: Payer: Self-pay | Admitting: Pulmonary Disease

## 2012-10-16 VITALS — BP 148/80 | HR 64 | Temp 99.9°F | Ht 61.5 in | Wt 158.4 lb

## 2012-10-16 DIAGNOSIS — R0602 Shortness of breath: Secondary | ICD-10-CM | POA: Diagnosis not present

## 2012-10-16 MED ORDER — PREDNISONE 10 MG PO TABS: ORAL_TABLET | ORAL | Status: DC

## 2012-10-16 NOTE — Patient Instructions (Addendum)
Will schedule you for breathing studies, and would like to see you on the same day to review with you.  Will hold off on oxygen with exertion for now, until we can see how much of this is potentially reversible.

## 2012-10-16 NOTE — Progress Notes (Signed)
  Subjective:    Patient ID: [name], female    DOB: 11/11/1930, 79 y.o.   MRN: [id number]  HPI The patient is an 79 year old female who I have been asked to see for dyspnea on exertion.  The patient states that she has had some degree of DOE for a few years, but currently describes a less than one block dyspnea on exertion at a moderate pace on flat ground.  She has not walked up a flight of stairs, but admits to being winded bringing groceries in from the car.  She has to use a cart to do so.  The patient has a history of smoking 2 packs per day for 20 years, but has not smoked since 1979.  She did have pulmonary function studies in 2010 that showed no airflow obstruction, no significant restriction, but a severe decrease in her diffusion capacity that   corrected to normal with alveolar volume adjustment.  She also has a history of coronary artery disease, and is status post CABG.  She had an echocardiogram in 2010 that showed diastolic dysfunction with high left ventricular filling pressures, mild to moderate AI, and mild MR.  The patient has a history of a mild cough which is dry in nature, but subsequently has resolved.  She was recently in [place] visiting a daughter, was found to have exertional desaturation into the low 80s.  This has been duplicated here in [place].  The patient denies any recent increase in lower extremity edema, and has had no recent echocardiogram or stress testing.  She has no history of pulmonary emboli or DVT.  Her weight it has been fairly neutral, and she denies any hypothyroidism or recent anemia.  She has had a recent chest x-ray in April that showed very mild accentuation of bronchovascular markings, but no acute process.   Review of Systems  Constitutional: Negative for fever and unexpected weight change.  HENT: Negative for ear pain, nosebleeds, congestion, sore throat, rhinorrhea, sneezing, trouble swallowing, dental problem, postnasal drip and sinus  pressure.   Eyes: Negative for redness and itching.  Respiratory: Positive for cough ( productive) and shortness of breath. Negative for chest tightness and wheezing.   Cardiovascular: Negative for palpitations and leg swelling.  Gastrointestinal: Negative for nausea and vomiting.  Genitourinary: Negative for dysuria.  Musculoskeletal: Negative for joint swelling.  Skin: Negative for rash.  Neurological: Negative for headaches.  Hematological: Does not bruise/bleed easily.  Psychiatric/Behavioral: Negative for dysphoric mood. The patient is not nervous/anxious.        Objective:   Physical Exam Constitutional:  Overweight female, no acute distress  HENT:  Nares patent without discharge  Oropharynx without exudate, palate and uvula are normal  Eyes:  Perrla, eomi, no scleral icterus  Neck:  No JVD, no TMG  Cardiovascular:  Normal rate, regular rhythm, no rubs or gallops.  No murmurs        Intact distal pulses  Pulmonary :  Normal breath sounds, no stridor or respiratory distress   No rhonchi, or wheezing.  +bibasilar crackles.   Abdominal:  Soft, nondistended, bowel sounds present.  No tenderness noted.   Musculoskeletal:  Minimal lower extremity edema noted.  Lymph Nodes:  No cervical lymphadenopathy noted  Skin:  No cyanosis noted  Neurologic:  Alert, appropriate, moves all 4 extremities without obvious deficit.         Assessment & Plan:

## 2012-10-16 NOTE — Assessment & Plan Note (Addendum)
The patient has significant dyspnea on exertion, and has had documented desaturations with walking.  PFTs in the past showed no airflow obstruction or significant restriction, but did have a severe decrease in diffusion capacity.  I would like to repeat her pulmonary function studies to compare to 2010, and if her diffusion capacity continues to be significantly decreased, she may need a high-resolution CT to evaluate for occult interstitial lung disease given the crackles on exam today.  Would also consider a diagnosis of chronic thromboembolic disease, and pulmonary htn.

## 2012-10-21 ENCOUNTER — Other Ambulatory Visit: Payer: Self-pay | Admitting: Family Medicine

## 2012-10-22 ENCOUNTER — Other Ambulatory Visit: Payer: Self-pay | Admitting: *Deleted

## 2012-10-22 MED ORDER — FLUTICASONE PROPIONATE 50 MCG/ACT NA SUSP: 2.0000 | Freq: Every day | NASAL | Status: DC

## 2012-10-23 NOTE — Telephone Encounter (Signed)
Last seen 06/21/12 FPW

## 2012-11-10 ENCOUNTER — Other Ambulatory Visit: Payer: Self-pay | Admitting: Family Medicine

## 2012-11-14 ENCOUNTER — Ambulatory Visit (INDEPENDENT_AMBULATORY_CARE_PROVIDER_SITE_OTHER): Payer: Medicare Other | Admitting: Pulmonary Disease

## 2012-11-14 ENCOUNTER — Encounter: Payer: Self-pay | Admitting: Pulmonary Disease

## 2012-11-14 VITALS — BP 132/68 | HR 68 | Temp 98.7°F | Ht 61.0 in | Wt 155.0 lb

## 2012-11-14 DIAGNOSIS — R0602 Shortness of breath: Secondary | ICD-10-CM

## 2012-11-14 LAB — PULMONARY FUNCTION TEST

## 2012-11-14 NOTE — Progress Notes (Signed)
  Subjective:    Patient ID: [name], female    DOB: 1931/01/11, 79 y.o.   MRN: [id number]  HPI The patient comes in today for followup after her recent pulmonary function studies, as part of a workup for dyspnea on exertion.  She was found to have no air flow obstruction, very mild restriction, and a moderate reduction in her diffusion capacity.  Compared to 2010, her total lung capacity and diffusion capacity both have improved.  I have reviewed the study with her and her family member in detail, and answered all of their questions.   Review of Systems  Constitutional: Negative for fever and unexpected weight change.  HENT: Negative for ear pain, nosebleeds, congestion, sore throat, rhinorrhea, sneezing, trouble swallowing, dental problem, postnasal drip and sinus pressure.   Eyes: Negative for redness and itching.  Respiratory: Positive for cough and shortness of breath ( with exertion and some with rest). Negative for chest tightness and wheezing.   Cardiovascular: Negative for palpitations and leg swelling.  Gastrointestinal: Negative for nausea and vomiting.  Genitourinary: Negative for dysuria.  Musculoskeletal: Negative for joint swelling.  Skin: Negative for rash.  Neurological: Negative for headaches.  Hematological: Does not bruise/bleed easily.  Psychiatric/Behavioral: Negative for dysphoric mood. The patient is not nervous/anxious.        Objective:   Physical Exam Overweight female in no acute distress Nose without purulence or discharge noted Neck without lymphadenopathy or thyromegaly. Lower extremities without edema, cyanosis Alert and oriented, moves all 4 extremities.       Assessment & Plan:

## 2012-11-14 NOTE — Assessment & Plan Note (Addendum)
The patient's PFTs showed no airflow obstruction, but did have mild restriction and a moderate reduction in DLCO both of which have improved from 2010.  The patient does have very mild crackles on exam, and mild accentuation of the bronchovascular markings from her chest x-ray in April.  Therefore, I think we need to do a high resolution CT chest to evaluate for occult interstitial lung disease.  If this is unremarkable, or if her CT findings are out of proportion to her level of dyspnea, I think she would benefit from a cardiac evaluation as well.  I suspect some of her restriction and decreased DLCO are related to her obesity.

## 2012-11-14 NOTE — Progress Notes (Signed)
PFT done today. 

## 2012-11-14 NOTE — Patient Instructions (Addendum)
Will check scan of chest to evaluate for pulmonary fibrosis.  Will call you with results If the scan is not overly impressive, would recommend a cardiac evaluation.

## 2012-11-19 ENCOUNTER — Ambulatory Visit (HOSPITAL_COMMUNITY)
Admission: RE | Admit: 2012-11-19 | Discharge: 2012-11-19 | Disposition: A | Discharge status: Home / Self Care | Payer: Medicare Other | Source: Ambulatory Visit | Attending: Pulmonary Disease | Admitting: Pulmonary Disease

## 2012-11-19 DIAGNOSIS — J438 Other emphysema: Secondary | ICD-10-CM | POA: Diagnosis not present

## 2012-11-19 DIAGNOSIS — R0609 Other forms of dyspnea: Secondary | ICD-10-CM | POA: Diagnosis not present

## 2012-11-19 DIAGNOSIS — R918 Other nonspecific abnormal finding of lung field: Secondary | ICD-10-CM | POA: Diagnosis not present

## 2012-11-19 DIAGNOSIS — R0989 Other specified symptoms and signs involving the circulatory and respiratory systems: Secondary | ICD-10-CM | POA: Insufficient documentation

## 2012-11-19 DIAGNOSIS — R0602 Shortness of breath: Secondary | ICD-10-CM

## 2012-11-20 ENCOUNTER — Other Ambulatory Visit: Payer: Self-pay | Admitting: Family Medicine

## 2012-11-22 ENCOUNTER — Ambulatory Visit (INDEPENDENT_AMBULATORY_CARE_PROVIDER_SITE_OTHER): Payer: Medicare Other | Admitting: Family Medicine

## 2012-11-22 ENCOUNTER — Encounter: Payer: Self-pay | Admitting: Family Medicine

## 2012-11-22 VITALS — BP 133/59 | HR 66 | Temp 98.1°F | Wt 153.8 lb

## 2012-11-22 DIAGNOSIS — I10 Essential (primary) hypertension: Secondary | ICD-10-CM

## 2012-11-22 DIAGNOSIS — E785 Hyperlipidemia, unspecified: Secondary | ICD-10-CM

## 2012-11-22 DIAGNOSIS — R0602 Shortness of breath: Secondary | ICD-10-CM

## 2012-11-22 DIAGNOSIS — Z951 Presence of aortocoronary bypass graft: Secondary | ICD-10-CM | POA: Diagnosis not present

## 2012-11-22 DIAGNOSIS — R635 Abnormal weight gain: Secondary | ICD-10-CM

## 2012-11-22 DIAGNOSIS — G4700 Insomnia, unspecified: Secondary | ICD-10-CM

## 2012-11-22 DIAGNOSIS — R7989 Other specified abnormal findings of blood chemistry: Secondary | ICD-10-CM | POA: Insufficient documentation

## 2012-11-22 DIAGNOSIS — I2510 Atherosclerotic heart disease of native coronary artery without angina pectoris: Secondary | ICD-10-CM

## 2012-11-22 DIAGNOSIS — R799 Abnormal finding of blood chemistry, unspecified: Secondary | ICD-10-CM | POA: Diagnosis not present

## 2012-11-22 DIAGNOSIS — E669 Obesity, unspecified: Secondary | ICD-10-CM

## 2012-11-22 DIAGNOSIS — G894 Chronic pain syndrome: Secondary | ICD-10-CM

## 2012-11-22 DIAGNOSIS — R0989 Other specified symptoms and signs involving the circulatory and respiratory systems: Secondary | ICD-10-CM

## 2012-11-22 MED ORDER — PROMETHAZINE HCL 25 MG PO TABS: ORAL_TABLET | ORAL | Status: DC

## 2012-11-22 MED ORDER — METOPROLOL SUCCINATE ER 50 MG PO TB24: 50.0000 mg | ORAL_TABLET | Freq: Every day | ORAL | Status: DC

## 2012-11-22 MED ORDER — ACETAMINOPHEN-CODEINE 300-30 MG PO TABS: 1.0000 | ORAL_TABLET | ORAL | Status: DC | PRN

## 2012-11-22 NOTE — Progress Notes (Signed)
Patient ID: [name], female   DOB: 04-Jun-1930, 79 y.o.   MRN: [id number] SUBJECTIVE: CC: Chief Complaint  Patient presents with  . Follow-up    3 month had grits this am for breakfast  . Medication Refill    refill metorporlol tylenol with codeine and promethazine  triametere     HPI:  Patient is here for follow up of hyperlipidemia/HTN/obesity/CAD/carotid artery disease. denies Headache;denies Chest Pain;denies weakness; Shortness of Breath and orthopnea;denies Visual changes;denies palpitations;denies cough;denies pedal edema;denies symptoms of TIA or stroke;deniesClaudication symptoms. admits to Compliance with medications; denies Problems with medications.  Past Medical History  Diagnosis Date  . CAD (coronary artery disease)   . HTN (hypertension)   . HLD (hyperlipidemia)   . Skin cancer   . Carotid stenosis     40-59% bilateral   Past Surgical History  Procedure Laterality Date  . Coronary artery bypass graft  1996    LIMA to LAD, SVG to PDA and SVG to D1   . Coronary artery bypass graft  2010    cath 2 vessel CAD with 2 of 3 bypass grafts occluded. had a patent LIMA to LAD  . Abdominal hysterectomy  1974  . Baldder resuspension  1988  . Tonsillectomy    . Skin cancer resected     History   Social History  . Marital Status: Single    Spouse Name: N/A    Number of Children: N/A  . Years of Education: N/A   Occupational History  . retired    Social History Main Topics  . Smoking status: Former Smoker -- 2.00 packs/day for 20 years    Types: Cigarettes    Quit date: 10/25/1977  . Smokeless tobacco: Not on file     Comment: smoked 2ppd for 15 years; quit in 1979  . Alcohol Use: No  . Drug Use: No  . Sexually Active: Not on file   Other Topics Concern  . 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    Family History  Problem Relation Age of Onset  . Heart disease Brother      multiple; ealry onset  . Heart disease Sister     multiple; early onset   . Breast cancer Sister   . Colon cancer Sister   . Emphysema Daughter    Current Outpatient Prescriptions on File Prior to Visit  Medication Sig Dispense Refill  . Acetaminophen-Codeine (TYLENOL/CODEINE #3) 300-30 MG per tablet Take 1 tablet by mouth every 4 (four) hours as needed.  60 tablet  2  . amLODipine (NORVASC) 10 MG tablet Take 10 mg by mouth daily.        . Calcium Carbonate-Vitamin D (CALCIUM 600/VITAMIN D) 600-400 MG-UNIT per tablet Take 1 tablet by mouth 2 (two) times daily.        . citalopram (CELEXA) 20 MG tablet Take 20 mg by mouth daily.        . fenofibrate 54 MG tablet TAKE 1 TABLET BY MOUTH ONCE DAILY  30 tablet  3  . fluticasone (FLONASE) 50 MCG/ACT nasal spray Place 2 sprays into the nose daily.  16 g  3  . isosorbide mononitrate (IMDUR) 60 MG 24 hr tablet TAKE ONE TABLET BY MOUTH EVERY DAY  30 tablet  4  . loratadine (CLARITIN) 10 MG tablet Take 10 mg by mouth daily.        . Melatonin-Pyridoxine (  MELATIN) 3-1 MG TABS Take 2 tablets by mouth at bedtime.        . metoprolol (TOPROL-XL) 50 MG 24 hr tablet Take 50 mg by mouth daily.        . Multiple Vitamin (MULTIVITAMIN) tablet Take 1 tablet by mouth 2 (two) times daily.       . Omega-3 Fatty Acids (FISH OIL) 1000 MG CAPS Take 2 capsules by mouth 2 (two) times daily.        Marland Kitchen omeprazole (PRILOSEC) 20 MG capsule Take 2 capsules (40 mg total) by mouth daily.  60 capsule  5  . potassium chloride (MICRO-K) 10 MEQ CR capsule TAKE 1 CAPSULE EVERY DAY  30 capsule  2  . promethazine (PHENERGAN) 25 MG tablet TAKE 2 TABLETS AT BEDTIME  60 tablet  0  . triamterene-hydrochlorothiazide (MAXZIDE) 75-50 MG per tablet Take 1 tablet by mouth daily.        . vitamin C (ASCORBIC ACID) 500 MG tablet Take 1,000 mg by mouth 2 (two) times daily.         No current facility-administered medications on file prior to visit.   Allergies  Allergen Reactions  .  Lisinopril   . Lunesta (Eszopiclone)   . Simvastatin   . Statins     lipitor crestor  . Ultram (Tramadol)    Immunization History  Administered Date(s) Administered  . Influenza Split 04/25/2012   Prior to Admission medications   Medication Sig Start Date End Date Taking? Authorizing Provider  Acetaminophen-Codeine (TYLENOL/CODEINE #3) 300-30 MG per tablet Take 1 tablet by mouth every 4 (four) hours as needed. 08/21/12  Yes Ileana Ladd, MD  amLODipine (NORVASC) 10 MG tablet Take 10 mg by mouth daily.     Yes Historical Provider, MD  Calcium Carbonate-Vitamin D (CALCIUM 600/VITAMIN D) 600-400 MG-UNIT per tablet Take 1 tablet by mouth 2 (two) times daily.     Yes Historical Provider, MD  citalopram (CELEXA) 20 MG tablet Take 20 mg by mouth daily.     Yes Historical Provider, MD  fenofibrate 54 MG tablet TAKE 1 TABLET BY MOUTH ONCE DAILY 11/10/12  Yes Ileana Ladd, MD  fluticasone [facility]) 50 MCG/ACT nasal spray Place 2 sprays into the nose daily. 10/22/12  Yes Ileana Ladd, MD  isosorbide mononitrate (IMDUR) 60 MG 24 hr tablet TAKE ONE TABLET BY MOUTH EVERY DAY 09/16/12  Yes Ileana Ladd, MD  loratadine (CLARITIN) 10 MG tablet Take 10 mg by mouth daily.     Yes Historical Provider, MD  Melatonin-Pyridoxine (MELATIN) 3-1 MG TABS Take 2 tablets by mouth at bedtime.     Yes Historical Provider, MD  metoprolol (TOPROL-XL) 50 MG 24 hr tablet Take 50 mg by mouth daily.     Yes Historical Provider, MD  Multiple Vitamin (MULTIVITAMIN) tablet Take 1 tablet by mouth 2 (two) times daily.    Yes Historical Provider, MD  Omega-3 Fatty Acids (FISH OIL) 1000 MG CAPS Take 2 capsules by mouth 2 (two) times daily.     Yes Historical Provider, MD  omeprazole (PRILOSEC) 20 MG capsule Take 2 capsules (40 mg total) by mouth daily. 08/21/12  Yes Ileana Ladd, MD  potassium chloride (MICRO-K) 10 MEQ CR capsule TAKE 1 CAPSULE EVERY DAY 11/20/12  Yes Ileana Ladd, MD  promethazine (PHENERGAN) 25 MG tablet  TAKE 2 TABLETS AT BEDTIME 10/21/12  Yes Ernestina Penna, MD  triamterene-hydrochlorothiazide (MAXZIDE) 75-50 MG per tablet Take 1 tablet by mouth daily.  Yes Historical Provider, MD  vitamin C (ASCORBIC ACID) 500 MG tablet Take 1,000 mg by mouth 2 (two) times daily.     Yes Historical Provider, MD     ROS: As above in the HPI. All other systems are stable or negative.  OBJECTIVE: APPEARANCE:  Patient in no acute distress.The patient appeared well nourished and normally developed. Acyanotic. Waist: VITAL SIGNS:BP 133/59  Pulse 66  Temp(Src) 98.1 F (36.7 C) (Oral)  Wt 153 lb 12.8 oz (69.763 kg)  BMI 29.08 kg/m2 Female  SKIN: warm and  Dry without overt rashes, tattoos and scars  HEAD and Neck: without JVD, Head and scalp: normal Eyes:No scleral icterus. Fundi normal, eye movements normal. Ears: Auricle normal, canal normal, Tympanic membranes normal, insufflation normal. Nose: normal Throat: normal Neck & thyroid: normal  CHEST & LUNGS: Chest wall: normal Lungs: Clear  CVS: Reveals the PMI to be normally located. Regular rhythm, First and Second Heart sounds are normal,  absence of murmurs, rubs or gallops. Peripheral vasculature: Radial pulses: normal Dorsal pedis pulses: normal Posterior pulses: normal Carotids bruits unchanged.  ABDOMEN:  Appearance: normal Benign, no organomegaly, no masses, no Abdominal Aortic enlargement. No Guarding , no rebound. No Bruits. Bowel sounds: normal  RECTAL: N/A GU: N/A  EXTREMETIES: nonedematous. Both Femoral and Pedal pulses are normal.  MUSCULOSKELETAL:  Spine: mild back pain. Mild kyphosis Joints: arthritic knees.  NEUROLOGIC: oriented to time,place and person; nonfocal.   ASSESSMENT: HYPERLIPIDEMIA - Plan: CMP14+EGFR, NMR, lipoprofile  Obesity, unspecified  Essential hypertension, benign - Plan: CMP14+EGFR  CAD - Plan: CMP14+EGFR, metoprolol succinate (TOPROL-XL) 50 MG 24 hr tablet  CAROTID  BRUIT  DYSPNEA  CORONARY ARTERY BYPASS GRAFT, THREE VESSEL, HX OF - Plan: CMP14+EGFR  Elevated serum creatinine - Plan: CMP14+EGFR  Insomnia - Plan: promethazine (PHENERGAN) 25 MG tablet  Chronic pain syndrome - Plan: Acetaminophen-Codeine (TYLENOL/CODEINE #3) 300-30 MG per tablet, TSH  PLAN: Orders Placed This Encounter  Procedures  . CMP14+EGFR  . NMR, lipoprofile  . TSH    Meds ordered this encounter  Medications  . promethazine (PHENERGAN) 25 MG tablet    Sig: TAKE 2 TABLETS AT BEDTIME    Dispense:  60 tablet    Refill:  5  . Acetaminophen-Codeine (TYLENOL/CODEINE #3) 300-30 MG per tablet    Sig: Take 1 tablet by mouth every 4 (four) hours as needed.    Dispense:  60 tablet    Refill:  2  . metoprolol succinate (TOPROL-XL) 50 MG 24 hr tablet    Sig: Take 1 tablet (50 mg total) by mouth daily.    Dispense:  30 tablet    Refill:  11    Results for orders placed in visit on 08/29/12  BRAIN NATRIURETIC PEPTIDE      Result Value Range   Brain Natriuretic Peptide 111.5 (*) 0.0 - 100.0 pg/mL        Dr Woodroe Mode Recommendations  Diet and Exercise discussed with patient.  For nutrition information, I recommend books:  1).Eat to Live by Dr Monico Hoar. 2).Prevent and Reverse Heart Disease by Dr Suzzette Righter. 3) Dr Katherina Right Book:  Program to Reverse Diabetes  Exercise recommendations are:  If unable to walk, then the patient can exercise in a chair 3 times a day. By flapping arms like a bird gently and raising legs outwards to the front.  If ambulatory, the patient can go for walks for 30 minutes 3 times a week. Then increase the intensity and duration as tolerated.  Goal is to try to attain exercise frequency to 5 times a week.  If applicable: Best to perform resistance exercises (machines or weights) 2 days a week and cardio type exercises 3 days per week.  Return in about 3 months (around 02/22/2013) for Recheck medical problems.  Wenzel Backlund  P. Modesto Charon, M.D.

## 2012-11-22 NOTE — Patient Instructions (Addendum)
      Dr Carlin Attridge's Recommendations  Diet and Exercise discussed with patient.  For nutrition information, I recommend books:  1).Eat to Live by Dr Joel Fuhrman. 2).Prevent and Reverse Heart Disease by Dr Caldwell Esselstyn. 3) Dr Neal Barnard's Book:  Program to Reverse Diabetes  Exercise recommendations are:  If unable to walk, then the patient can exercise in a chair 3 times a day. By flapping arms like a bird gently and raising legs outwards to the front.  If ambulatory, the patient can go for walks for 30 minutes 3 times a week. Then increase the intensity and duration as tolerated.  Goal is to try to attain exercise frequency to 5 times a week.  If applicable: Best to perform resistance exercises (machines or weights) 2 days a week and cardio type exercises 3 days per week.  

## 2012-11-24 LAB — CMP14+EGFR
ALT: 22 IU/L (ref 0–32)
AST: 27 IU/L (ref 0–40)
Albumin/Globulin Ratio: 2 (ref 1.1–2.5)
Albumin: 4.5 g/dL (ref 3.5–4.7)
Alkaline Phosphatase: 50 IU/L (ref 39–117)
BUN/Creatinine Ratio: 19 (ref 11–26)
BUN: 29 mg/dL — ABNORMAL HIGH (ref 8–27)
CO2: 27 mmol/L (ref 18–29)
Calcium: 9.7 mg/dL (ref 8.6–10.2)
Chloride: 94 mmol/L — ABNORMAL LOW (ref 97–108)
Creatinine, Ser: 1.51 mg/dL — ABNORMAL HIGH (ref 0.57–1.00)
GFR calc Af Amer: 37 mL/min/{1.73_m2} — ABNORMAL LOW (ref >59)
GFR calc non Af Amer: 32 mL/min/{1.73_m2} — ABNORMAL LOW (ref >59)
Globulin, Total: 2.2 g/dL (ref 1.5–4.5)
Glucose: 88 mg/dL (ref 65–99)
Potassium: 4.5 mmol/L (ref 3.5–5.2)
Sodium: 135 mmol/L (ref 134–144)
Total Bilirubin: 0.2 mg/dL (ref 0.0–1.2)
Total Protein: 6.7 g/dL (ref 6.0–8.5)

## 2012-11-24 LAB — TSH: TSH: 2.14 u[IU]/mL (ref 0.450–4.500)

## 2012-11-24 LAB — NMR, LIPOPROFILE
Cholesterol: 287 mg/dL — ABNORMAL HIGH (ref <200)
HDL Cholesterol by NMR: 75 mg/dL (ref >=40)
HDL Particle Number: 41.1 umol/L (ref >=30.5)
LDL Particle Number: 2047 nmol/L — ABNORMAL HIGH (ref <1000)
LDL Size: 22.4 nm (ref >20.5)
LDLC SERPL CALC-MCNC: 183 mg/dL — ABNORMAL HIGH (ref <100)
LP-IR Score: <25 (ref <=45)
Small LDL Particle Number: <90 nmol/L (ref <=527)
Triglycerides by NMR: 145 mg/dL (ref <150)

## 2012-11-27 ENCOUNTER — Telehealth: Payer: Self-pay | Admitting: Pulmonary Disease

## 2012-11-27 NOTE — Telephone Encounter (Signed)
There is a suggestion of scarring in the lungs which could explain all of her symptoms so I would rec she wait until Dr Shelle Iron returns and he can make the call as to what the next step should be

## 2012-11-27 NOTE — Telephone Encounter (Signed)
Per KC last note recs were as follows:  Will check scan of chest to evaluate for pulmonary fibrosis. Will call you with results  If the scan is not overly impressive, would recommend a cardiac evaluation.        Pt is requesting CT results and does not want to wait until [facility] return. Dr. Sherene Sires can you give the pt results? Please advise. [name], CMA

## 2012-11-27 NOTE — Telephone Encounter (Signed)
Pt advised. I will send message to [facility] to address when he returns. Carron Curie, CMA

## 2012-11-28 ENCOUNTER — Other Ambulatory Visit: Payer: Self-pay

## 2012-12-06 ENCOUNTER — Telehealth: Payer: Self-pay | Admitting: Pulmonary Disease

## 2012-12-06 NOTE — Telephone Encounter (Signed)
Pt was advised of this on 11/27/12: Nyoka Cowden, MD at 11/27/2012 12:24 PM   Status: Signed            There is a suggestion of scarring in the lungs which could explain all of her symptoms so I would rec she wait until Dr Shelle Iron returns and he can make the call as to what the next step should be     Please advise KC thanks

## 2012-12-06 NOTE — Telephone Encounter (Signed)
Please let pt know that her ct chest does show some mild scarring that is difficult to tell where it came from.  I would like to see her back in office to discuss with her, and whether we need to try treating it.   Ashtyn, please arrange for followup .

## 2012-12-06 NOTE — Telephone Encounter (Signed)
Results have been explained to patient, pt expressed understanding. Patients daughter to call back in the AM to schedule follow up with [facility] (at work right now)

## 2012-12-07 ENCOUNTER — Encounter: Payer: Self-pay | Admitting: Pulmonary Disease

## 2012-12-09 ENCOUNTER — Other Ambulatory Visit: Payer: Self-pay | Admitting: Family Medicine

## 2012-12-10 NOTE — Telephone Encounter (Signed)
Patient is scheduled to see Dr. Clemon Chambers 12/14/12 @ 430pm

## 2012-12-13 ENCOUNTER — Telehealth: Payer: Self-pay | Admitting: *Deleted

## 2012-12-13 NOTE — Telephone Encounter (Signed)
CT Results have been explained to patient, pt expressed understanding.  Patient to be scheduled for 6 months out -- daughter to call back 12/14/12 to set up appt for Feb 2015

## 2012-12-14 ENCOUNTER — Telehealth: Payer: Self-pay | Admitting: Family Medicine

## 2012-12-14 ENCOUNTER — Ambulatory Visit: Payer: Medicare Other | Admitting: Pulmonary Disease

## 2012-12-14 ENCOUNTER — Other Ambulatory Visit: Payer: Self-pay | Admitting: Family Medicine

## 2012-12-14 DIAGNOSIS — R0600 Dyspnea, unspecified: Secondary | ICD-10-CM

## 2012-12-14 NOTE — Telephone Encounter (Signed)
Pt.notified

## 2012-12-14 NOTE — Telephone Encounter (Signed)
Referring to cardiology.  

## 2012-12-14 NOTE — Telephone Encounter (Signed)
Mailbox full unable to leave message.

## 2013-01-03 ENCOUNTER — Other Ambulatory Visit: Payer: Self-pay | Admitting: Family Medicine

## 2013-01-07 ENCOUNTER — Other Ambulatory Visit: Payer: Self-pay | Admitting: Family Medicine

## 2013-01-07 NOTE — Telephone Encounter (Signed)
Prescription renewed in EPIC. 

## 2013-01-07 NOTE — Telephone Encounter (Signed)
Last seen 11/22/12  FPW  Routing this just because of the Phenergin

## 2013-01-18 ENCOUNTER — Other Ambulatory Visit: Payer: Self-pay

## 2013-01-18 DIAGNOSIS — I2510 Atherosclerotic heart disease of native coronary artery without angina pectoris: Secondary | ICD-10-CM

## 2013-01-18 MED ORDER — FENOFIBRATE 54 MG PO TABS: 54.0000 mg | ORAL_TABLET | Freq: Every day | ORAL | Status: DC

## 2013-01-18 MED ORDER — METOPROLOL SUCCINATE ER 50 MG PO TB24: 50.0000 mg | ORAL_TABLET | Freq: Every day | ORAL | Status: DC

## 2013-01-18 MED ORDER — FLUTICASONE PROPIONATE 50 MCG/ACT NA SUSP: 2.0000 | Freq: Every day | NASAL | Status: DC

## 2013-01-18 MED ORDER — POTASSIUM CHLORIDE ER 10 MEQ PO CPCR: 10.0000 meq | ORAL_CAPSULE | Freq: Every day | ORAL | Status: DC

## 2013-01-18 MED ORDER — OMEPRAZOLE 20 MG PO CPDR: 20.0000 mg | DELAYED_RELEASE_CAPSULE | Freq: Every day | ORAL | Status: DC

## 2013-01-18 MED ORDER — CITALOPRAM HYDROBROMIDE 20 MG PO TABS: 20.0000 mg | ORAL_TABLET | Freq: Every day | ORAL | Status: DC

## 2013-01-18 MED ORDER — ISOSORBIDE MONONITRATE ER 60 MG PO TB24: 60.0000 mg | ORAL_TABLET | Freq: Every day | ORAL | Status: DC

## 2013-01-30 ENCOUNTER — Ambulatory Visit (INDEPENDENT_AMBULATORY_CARE_PROVIDER_SITE_OTHER): Payer: Medicare Other | Admitting: Cardiology

## 2013-01-30 ENCOUNTER — Encounter: Payer: Self-pay | Admitting: Cardiology

## 2013-01-30 VITALS — BP 166/67 | HR 65 | Ht 61.0 in | Wt 154.0 lb

## 2013-01-30 DIAGNOSIS — I2510 Atherosclerotic heart disease of native coronary artery without angina pectoris: Secondary | ICD-10-CM | POA: Diagnosis not present

## 2013-01-30 NOTE — Progress Notes (Signed)
HPI The patient presents for yearly followup of dysnpea. She had been noted to have some decreased oxygen saturations recently. They're very mildly elevated BNP. She did have some fine crackles on exam. She had some mildly abnormal pulmonary function tests in the past with a decreased diffusion capacity.  She saw a pulmonologist and there was some scarring on CT scan but apparently no evidence of pulmonary fibrosis.  Since that visit she insists she is doing much better. She says she'll only get short of breath if she hurries. She does have any shortness of breath at rest. She's not describing PND or orthopnea. She said she's not had palpitations, presyncope or syncope. She denies any chest pain. She's had no weight gain or edema.  Allergies  Allergen Reactions  . Lisinopril   . Lunesta [Eszopiclone]   . Simvastatin   . Statins     lipitor crestor  . Ultram [Tramadol]     Current Outpatient Prescriptions  Medication Sig Dispense Refill  . Acetaminophen-Codeine (TYLENOL/CODEINE #3) 300-30 MG per tablet Take 1 tablet by mouth every 4 (four) hours as needed.  60 tablet  2  . amLODipine (NORVASC) 10 MG tablet TAKE 1 TABLET EVERY DAY  30 tablet  5  . Calcium Carbonate-Vitamin D (CALCIUM 600/VITAMIN D) 600-400 MG-UNIT per tablet Take 1 tablet by mouth 2 (two) times daily.        . citalopram (CELEXA) 20 MG tablet Take 1 tablet (20 mg total) by mouth daily.  90 tablet  0  . fenofibrate 54 MG tablet Take 1 tablet (54 mg total) by mouth daily.  90 tablet  0  . fluticasone (FLONASE) 50 MCG/ACT nasal spray Place 2 sprays into the nose daily.  48 g  1  . isosorbide mononitrate (IMDUR) 60 MG 24 hr tablet Take 1 tablet (60 mg total) by mouth daily.  90 tablet  0  . loratadine (CLARITIN) 10 MG tablet Take 10 mg by mouth daily.        . metoprolol succinate (TOPROL-XL) 50 MG 24 hr tablet Take 1 tablet (50 mg total) by mouth daily.  90 tablet  2  . Multiple Vitamin (MULTIVITAMIN) tablet Take 1 tablet by  mouth 2 (two) times daily.       . Omega-3 Fatty Acids (FISH OIL) 1000 MG CAPS Take 2 capsules by mouth 2 (two) times daily.        Marland Kitchen omeprazole (PRILOSEC) 20 MG capsule Take 1 capsule (20 mg total) by mouth daily.  180 capsule  0  . potassium chloride (MICRO-K) 10 MEQ CR capsule Take 1 capsule (10 mEq total) by mouth daily.  90 capsule  0  . promethazine (PHENERGAN) 25 MG tablet TAKE 2 TABLETS AT BEDTIME  60 tablet  3  . triamterene-hydrochlorothiazide (MAXZIDE) 75-50 MG per tablet take one daily      . vitamin C (ASCORBIC ACID) 500 MG tablet Take 1,000 mg by mouth 2 (two) times daily.        . Melatonin-Pyridoxine (MELATIN) 3-1 MG TABS Take 2 tablets by mouth at bedtime.         No current facility-administered medications for this visit.    Past Medical History  Diagnosis Date  . CAD (coronary artery disease)   . HTN (hypertension)   . HLD (hyperlipidemia)   . Skin cancer   . Carotid stenosis     40-59% bilateral    Past Surgical History  Procedure Laterality Date  . Coronary artery bypass graft  1996    LIMA to LAD, SVG to PDA and SVG to D1   . Coronary artery bypass graft  2010    cath 2 vessel CAD with 2 of 3 bypass grafts occluded. had a patent LIMA to LAD  . Abdominal hysterectomy  1974  . Baldder resuspension  1988  . Tonsillectomy    . Skin cancer resected      ROS:  As stated in the HPI and negative for all other systems.  PHYSICAL EXAM BP 166/67  Pulse 65  Ht 5\' 1"  (1.549 m)  Wt 154 lb (69.854 kg)  BMI 29.11 kg/m2 GENERAL:  Well appearing NECK:  No jugular venous distention, waveform within normal limits, carotid upstroke brisk and symmetric, right carotid and subclavian bruits, no thyromegaly LUNGS:  Bilateral fine crackles, right greater than left BACK:  No CVA tenderness CHEST:  Well healed sternotomy scar. HEART:  PMI not displaced or sustained,S1 and S2 within normal limits, no S3, no S4, no clicks, no rubs, no murmurs ABD:  Flat, positive bowel sounds  normal in frequency in pitch, no bruits, no rebound, no guarding, no midline pulsatile mass, no hepatomegaly, no splenomegaly EXT:  2 plus pulses throughout, no edema, no cyanosis no clubbing  EKG:  Sinus rhythm, rate 62, axis within normal limits, intervals within normal limits, no acute ST-T wave changes.  01/30/2013  ASSESSMENT AND PLAN  CAD:  I do not think her symptoms are an anginal equivalent. She does not want further workup at this point. For now she will continue the meds as listed.  DYSPNEA:  She does agree to followup with pulmonary and with me and let he know if she has any worsening symptoms in the future. In the meantime she does not want further extensive evaluation.  CAROTID STENOSIS:  This was 40-59% bilateral stenosis in January. She will be followed again in January 2015.  HTN:  The blood pressure is at target. No change in medications is indicated. We will continue with therapeutic lifestyle changes (TLC).

## 2013-01-30 NOTE — Patient Instructions (Signed)
The current medical regimen is effective;  continue present plan and medications.  Follow up in 6 months with Dr Hochrein.  You will receive a letter in the mail 2 months before you are due.  Please call us when you receive this letter to schedule your follow up appointment.  

## 2013-02-22 ENCOUNTER — Ambulatory Visit (INDEPENDENT_AMBULATORY_CARE_PROVIDER_SITE_OTHER): Payer: Medicare Other | Admitting: Family Medicine

## 2013-02-22 ENCOUNTER — Encounter: Payer: Self-pay | Admitting: Family Medicine

## 2013-02-22 VITALS — BP 147/64 | HR 63 | Temp 98.1°F | Ht 61.5 in | Wt 148.0 lb

## 2013-02-22 DIAGNOSIS — G4700 Insomnia, unspecified: Secondary | ICD-10-CM

## 2013-02-22 DIAGNOSIS — I10 Essential (primary) hypertension: Secondary | ICD-10-CM

## 2013-02-22 DIAGNOSIS — Z951 Presence of aortocoronary bypass graft: Secondary | ICD-10-CM | POA: Diagnosis not present

## 2013-02-22 DIAGNOSIS — R0989 Other specified symptoms and signs involving the circulatory and respiratory systems: Secondary | ICD-10-CM

## 2013-02-22 DIAGNOSIS — R0602 Shortness of breath: Secondary | ICD-10-CM

## 2013-02-22 DIAGNOSIS — I2510 Atherosclerotic heart disease of native coronary artery without angina pectoris: Secondary | ICD-10-CM | POA: Diagnosis not present

## 2013-02-22 DIAGNOSIS — E669 Obesity, unspecified: Secondary | ICD-10-CM

## 2013-02-22 DIAGNOSIS — G894 Chronic pain syndrome: Secondary | ICD-10-CM

## 2013-02-22 DIAGNOSIS — E785 Hyperlipidemia, unspecified: Secondary | ICD-10-CM | POA: Diagnosis not present

## 2013-02-22 DIAGNOSIS — R7989 Other specified abnormal findings of blood chemistry: Secondary | ICD-10-CM

## 2013-02-22 DIAGNOSIS — R799 Abnormal finding of blood chemistry, unspecified: Secondary | ICD-10-CM

## 2013-02-22 MED ORDER — PROMETHAZINE HCL 25 MG PO TABS: ORAL_TABLET | ORAL | Status: DC

## 2013-02-22 MED ORDER — ACETAMINOPHEN-CODEINE 300-30 MG PO TABS: 1.0000 | ORAL_TABLET | ORAL | Status: DC | PRN

## 2013-02-22 NOTE — Patient Instructions (Signed)
      Dr Tayja Manzer's Recommendations  For nutrition information, I recommend books:  1).Eat to Live by Dr Joel Fuhrman. 2).Prevent and Reverse Heart Disease by Dr Caldwell Esselstyn. 3) Dr Neal Barnard's Book:  Program to Reverse Diabetes  Exercise recommendations are:  If unable to walk, then the patient can exercise in a chair 3 times a day. By flapping arms like a bird gently and raising legs outwards to the front.  If ambulatory, the patient can go for walks for 30 minutes 3 times a week. Then increase the intensity and duration as tolerated.  Goal is to try to attain exercise frequency to 5 times a week.  If applicable: Best to perform resistance exercises (machines or weights) 2 days a week and cardio type exercises 3 days per week.  

## 2013-02-22 NOTE — Progress Notes (Signed)
Patient ID: [name], female   DOB: 07/25/1930, 79 y.o.   MRN: [id number] SUBJECTIVE: CC: Chief Complaint  Patient presents with  . Follow-up    3 MONTH   . Medication Refill    PROMETHAZINE AND TYLENOL WITH CODEINE     HPI:  Patient is here for follow up of hyperlipidemia/CAD/HTN/Carotid  stenosis: denies Headache;denies Chest Pain;denies weakness;Shortness of Breath no changes,no orthopnea;denies Visual changes;denies palpitations;denies cough;denies pedal edema;denies symptoms of TIA or stroke;deniesClaudication symptoms. admits to Compliance with medications; denies Problems with medications. Doing great and stable. Her present regimen is working the best for her. Doesn't want  To change  A thing.    Past Medical History  Diagnosis Date  . CAD (coronary artery disease)   . HTN (hypertension)   . HLD (hyperlipidemia)   . Skin cancer   . Carotid stenosis     40-59% bilateral   Past Surgical History  Procedure Laterality Date  . Coronary artery bypass graft  1996    LIMA to LAD, SVG to PDA and SVG to D1   . Coronary artery bypass graft  2010    cath 2 vessel CAD with 2 of 3 bypass grafts occluded. had a patent LIMA to LAD  . Abdominal hysterectomy  1974  . Baldder resuspension  1988  . Tonsillectomy    . Skin cancer resected     History   Social History  . Marital Status: Single    Spouse Name: N/A    Number of Children: N/A  . Years of Education: N/A   Occupational History  . retired    Social History Main Topics  . Smoking status: Former Smoker -- 2.00 packs/day for 20 years    Types: Cigarettes    Quit date: 10/25/1977  . Smokeless tobacco: Not on file     Comment: smoked 2ppd for 15 years; quit in 1979  . Alcohol Use: No  . Drug Use: No  . Sexual Activity: Not on file   Other Topics Concern  . 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    Family History  Problem  Relation Age of Onset  . Heart disease Brother     multiple; ealry onset  . Heart disease Sister     multiple; early onset   . Breast cancer Sister   . Colon cancer Sister   . Emphysema Daughter    Current Outpatient Prescriptions on File Prior to Visit  Medication Sig Dispense Refill  . amLODipine (NORVASC) 10 MG tablet TAKE 1 TABLET EVERY DAY  30 tablet  5  . Calcium Carbonate-Vitamin D (CALCIUM 600/VITAMIN D) 600-400 MG-UNIT per tablet Take 1 tablet by mouth 2 (two) times daily.        . citalopram (CELEXA) 20 MG tablet Take 1 tablet (20 mg total) by mouth daily.  90 tablet  0  . fenofibrate 54 MG tablet Take 1 tablet (54 mg total) by mouth daily.  90 tablet  0  . fluticasone (FLONASE) 50 MCG/ACT nasal spray Place 2 sprays into the nose daily.  48 g  1  . isosorbide mononitrate (IMDUR) 60 MG 24 hr tablet Take 1 tablet (60 mg total) by mouth daily.  90 tablet  0  . loratadine (CLARITIN) 10 MG tablet Take 10 mg by mouth daily.        . Melatonin-Pyridoxine (MELATIN) 3-1 MG TABS Take 2 tablets  by mouth at bedtime.        . metoprolol succinate (TOPROL-XL) 50 MG 24 hr tablet Take 1 tablet (50 mg total) by mouth daily.  90 tablet  2  . Multiple Vitamin (MULTIVITAMIN) tablet Take 1 tablet by mouth 2 (two) times daily.       . Omega-3 Fatty Acids (FISH OIL) 1000 MG CAPS Take 2 capsules by mouth 2 (two) times daily.        Marland Kitchen omeprazole (PRILOSEC) 20 MG capsule Take 1 capsule (20 mg total) by mouth daily.  180 capsule  0  . potassium chloride (MICRO-K) 10 MEQ CR capsule Take 1 capsule (10 mEq total) by mouth daily.  90 capsule  0  . triamterene-hydrochlorothiazide (MAXZIDE) 75-50 MG per tablet take one daily      . vitamin C (ASCORBIC ACID) 500 MG tablet Take 1,000 mg by mouth 2 (two) times daily.         No current facility-administered medications on file prior to visit.   Allergies  Allergen Reactions  . Lisinopril   . Lunesta [Eszopiclone]   . Simvastatin   . Statins     lipitor  crestor  . Ultram [Tramadol]    Immunization History  Administered Date(s) Administered  . Influenza Split 04/25/2012  . Pneumococcal-Generic 04/26/2003  . Tdap 08/24/2010   Prior to Admission medications   Medication Sig Start Date End Date Taking? Authorizing Provider  Acetaminophen-Codeine (TYLENOL/CODEINE #3) 300-30 MG per tablet Take 1 tablet by mouth every 4 (four) hours as needed. 11/22/12   Ileana Ladd, MD  amLODipine (NORVASC) 10 MG tablet TAKE 1 TABLET EVERY DAY 01/07/13   Ileana Ladd, MD  Calcium Carbonate-Vitamin D (CALCIUM 600/VITAMIN D) 600-400 MG-UNIT per tablet Take 1 tablet by mouth 2 (two) times daily.      Historical Provider, MD  citalopram (CELEXA) 20 MG tablet Take 1 tablet (20 mg total) by mouth daily. 01/18/13   Ernestina Penna, MD  fenofibrate 54 MG tablet Take 1 tablet (54 mg total) by mouth daily. 01/18/13   Ernestina Penna, MD  fluticasone (FLONASE) 50 MCG/ACT nasal spray Place 2 sprays into the nose daily. 01/18/13   Ernestina Penna, MD  isosorbide mononitrate (IMDUR) 60 MG 24 hr tablet Take 1 tablet (60 mg total) by mouth daily. 01/18/13   Ernestina Penna, MD  loratadine (CLARITIN) 10 MG tablet Take 10 mg by mouth daily.      Historical Provider, MD  Melatonin-Pyridoxine (MELATIN) 3-1 MG TABS Take 2 tablets by mouth at bedtime.      Historical Provider, MD  metoprolol succinate (TOPROL-XL) 50 MG 24 hr tablet Take 1 tablet (50 mg total) by mouth daily. 01/18/13   Ernestina Penna, MD  Multiple Vitamin (MULTIVITAMIN) tablet Take 1 tablet by mouth 2 (two) times daily.     Historical Provider, MD  Omega-3 Fatty Acids (FISH OIL) 1000 MG CAPS Take 2 capsules by mouth 2 (two) times daily.      Historical Provider, MD  omeprazole (PRILOSEC) 20 MG capsule Take 1 capsule (20 mg total) by mouth daily. 01/18/13   Ernestina Penna, MD  potassium chloride (MICRO-K) 10 MEQ CR capsule Take 1 capsule (10 mEq total) by mouth daily. 01/18/13   Ernestina Penna, MD  promethazine (PHENERGAN) 25  MG tablet TAKE 2 TABLETS AT BEDTIME 01/03/13   Ileana Ladd, MD  triamterene-hydrochlorothiazide [facility]) 75-50 MG per tablet take one daily 01/07/13   Ileana Ladd,  MD  vitamin C (ASCORBIC ACID) 500 MG tablet Take 1,000 mg by mouth 2 (two) times daily.      Historical Provider, MD     ROS: As above in the HPI. All other systems are stable or negative.  OBJECTIVE: APPEARANCE:  Patient in no acute distress.The patient appeared well nourished and normally developed. Acyanotic. Waist: VITAL SIGNS:BP 147/64  Pulse 63  Temp(Src) 98.1 F (36.7 C) (Oral)  Ht 5' 1.5" (1.562 m)  Wt 148 lb (67.132 kg)  BMI 27.51 kg/m2  WF  SKIN: warm and  Dry without overt rashes, tattoos and scars  HEAD and Neck: without JVD, Head and scalp: normal Eyes:No scleral icterus. Fundi normal, eye movements normal. Ears: Auricle normal, canal normal, Tympanic membranes normal, insufflation normal. Nose: normal Throat: normal Neck & thyroid: normal  CHEST & LUNGS: Chest wall: normal Lungs: Clear  CVS: Reveals the PMI to be normally located. Regular rhythm, First and Second Heart sounds are normal,  absence of murmurs, rubs or gallops. Peripheral vasculature: Radial pulses: normal Dorsal pedis pulses: normal Posterior pulses: normal  ABDOMEN:  Appearance: normal Benign, no organomegaly, no masses, no Abdominal Aortic enlargement. No Guarding , no rebound. No Bruits. Bowel sounds: normal  RECTAL: N/A GU: N/A  EXTREMETIES: nonedematous.  MUSCULOSKELETAL:  Spine: normal Joints: intact  NEUROLOGIC: oriented to time,place and person; nonfocal. Strength is normal Sensory is normal Reflexes are normal Cranial Nerves are normal.  ASSESSMENT: CAD - Plan: CMP14+EGFR  CAROTID BRUIT  CORONARY ARTERY BYPASS GRAFT, THREE VESSEL, HX OF - Plan: CMP14+EGFR  DYSPNEA  Essential hypertension, benign  HYPERLIPIDEMIA - Plan: CMP14+EGFR, NMR, lipoprofile  Obesity, unspecified  Elevated  serum creatinine  Chronic pain syndrome - Plan: Acetaminophen-Codeine (TYLENOL/CODEINE #3) 300-30 MG per tablet, DISCONTINUED: Acetaminophen-Codeine (TYLENOL/CODEINE #3) 300-30 MG per tablet  Insomnia - Plan: promethazine (PHENERGAN) 25 MG tablet  PLAN: Reviewed records in Epic..  Orders Placed This Encounter  Procedures  . CMP14+EGFR  . NMR, lipoprofile   Meds ordered this encounter  Medications  . DISCONTD: Acetaminophen-Codeine (TYLENOL/CODEINE #3) 300-30 MG per tablet    Sig: Take 1 tablet by mouth every 4 (four) hours as needed.    Dispense:  60 tablet    Refill:  0  . promethazine (PHENERGAN) 25 MG tablet    Sig: TAKE 2 TABLETS AT BEDTIME    Dispense:  60 tablet    Refill:  3  . Acetaminophen-Codeine (TYLENOL/CODEINE #3) 300-30 MG per tablet    Sig: Take 1 tablet by mouth every 4 (four) hours as needed.    Dispense:  60 tablet    Refill:  3   Medications Discontinued During This Encounter  Medication Reason  . Acetaminophen-Codeine (TYLENOL/CODEINE #3) 300-30 MG per tablet Reorder  . promethazine (PHENERGAN) 25 MG tablet Reorder  . Acetaminophen-Codeine (TYLENOL/CODEINE #3) 300-30 MG per tablet Reorder        Dr Woodroe Mode Recommendations  For nutrition information, I recommend books:  1).Eat to Live by Dr Monico Hoar. 2).Prevent and Reverse Heart Disease by Dr Suzzette Righter. 3) Dr Katherina Right Book:  Program to Reverse Diabetes  Exercise recommendations are:  If unable to walk, then the patient can exercise in a chair 3 times a day. By flapping arms like a bird gently and raising legs outwards to the front.  If ambulatory, the patient can go for walks for 30 minutes 3 times a week. Then increase the intensity and duration as tolerated.  Goal is to try to attain exercise  frequency to 5 times a week.  If applicable: Best to perform resistance exercises (machines or weights) 2 days a week and cardio type exercises 3 days per week.  Return in about  4 months (around 06/22/2013) for Recheck medical problems.  Anjelina Dung P. [place]esto Charon, M.D.

## 2013-02-24 LAB — CMP14+EGFR
ALT: 22 IU/L (ref 0–32)
AST: 24 IU/L (ref 0–40)
Albumin/Globulin Ratio: 2 (ref 1.1–2.5)
Albumin: 4.5 g/dL (ref 3.5–4.7)
Alkaline Phosphatase: 56 IU/L (ref 39–117)
BUN/Creatinine Ratio: 21 (ref 11–26)
BUN: 29 mg/dL — ABNORMAL HIGH (ref 8–27)
CO2: 24 mmol/L (ref 18–29)
Calcium: 9.7 mg/dL (ref 8.6–10.2)
Chloride: 93 mmol/L — ABNORMAL LOW (ref 97–108)
Creatinine, Ser: 1.39 mg/dL — ABNORMAL HIGH (ref 0.57–1.00)
GFR calc Af Amer: 41 mL/min/{1.73_m2} — ABNORMAL LOW (ref >59)
GFR calc non Af Amer: 36 mL/min/{1.73_m2} — ABNORMAL LOW (ref >59)
Globulin, Total: 2.3 g/dL (ref 1.5–4.5)
Glucose: 88 mg/dL (ref 65–99)
Potassium: 4.8 mmol/L (ref 3.5–5.2)
Sodium: 134 mmol/L (ref 134–144)
Total Bilirubin: 0.3 mg/dL (ref 0.0–1.2)
Total Protein: 6.8 g/dL (ref 6.0–8.5)

## 2013-02-24 LAB — SPECIMEN STATUS REPORT

## 2013-02-24 LAB — NMR, LIPOPROFILE
Cholesterol: 289 mg/dL — ABNORMAL HIGH (ref <200)
HDL Cholesterol by NMR: 74 mg/dL (ref >=40)
HDL Particle Number: 39.3 umol/L (ref >=30.5)
LDL Particle Number: 1934 nmol/L — ABNORMAL HIGH (ref <1000)
LDL Size: 22.6 nm (ref >20.5)
LDLC SERPL CALC-MCNC: 183 mg/dL — ABNORMAL HIGH (ref <100)
LP-IR Score: <25 (ref <=45)
Small LDL Particle Number: 150 nmol/L (ref <=527)
Triglycerides by NMR: 161 mg/dL — ABNORMAL HIGH (ref <150)

## 2013-02-28 ENCOUNTER — Other Ambulatory Visit: Payer: Self-pay

## 2013-05-03 ENCOUNTER — Other Ambulatory Visit: Payer: Self-pay | Admitting: Family Medicine

## 2013-05-10 ENCOUNTER — Other Ambulatory Visit: Payer: Self-pay | Admitting: Family Medicine

## 2013-05-17 ENCOUNTER — Ambulatory Visit (INDEPENDENT_AMBULATORY_CARE_PROVIDER_SITE_OTHER): Payer: Medicare Other | Admitting: Family Medicine

## 2013-05-17 ENCOUNTER — Encounter: Payer: Self-pay | Admitting: Family Medicine

## 2013-05-17 VITALS — BP 111/59 | HR 69 | Temp 98.0°F | Ht 61.5 in | Wt 150.6 lb

## 2013-05-17 DIAGNOSIS — R0602 Shortness of breath: Secondary | ICD-10-CM

## 2013-05-17 DIAGNOSIS — R7989 Other specified abnormal findings of blood chemistry: Secondary | ICD-10-CM

## 2013-05-17 DIAGNOSIS — R0989 Other specified symptoms and signs involving the circulatory and respiratory systems: Secondary | ICD-10-CM

## 2013-05-17 DIAGNOSIS — E785 Hyperlipidemia, unspecified: Secondary | ICD-10-CM | POA: Diagnosis not present

## 2013-05-17 DIAGNOSIS — I10 Essential (primary) hypertension: Secondary | ICD-10-CM

## 2013-05-17 DIAGNOSIS — R799 Abnormal finding of blood chemistry, unspecified: Secondary | ICD-10-CM | POA: Diagnosis not present

## 2013-05-17 DIAGNOSIS — E669 Obesity, unspecified: Secondary | ICD-10-CM

## 2013-05-17 DIAGNOSIS — G894 Chronic pain syndrome: Secondary | ICD-10-CM | POA: Insufficient documentation

## 2013-05-17 DIAGNOSIS — I2510 Atherosclerotic heart disease of native coronary artery without angina pectoris: Secondary | ICD-10-CM | POA: Diagnosis not present

## 2013-05-17 DIAGNOSIS — Z951 Presence of aortocoronary bypass graft: Secondary | ICD-10-CM

## 2013-05-17 DIAGNOSIS — G4700 Insomnia, unspecified: Secondary | ICD-10-CM | POA: Insufficient documentation

## 2013-05-17 MED ORDER — GEMFIBROZIL 600 MG PO TABS: 600.0000 mg | ORAL_TABLET | Freq: Two times a day (BID) | ORAL | Status: DC

## 2013-05-17 MED ORDER — PROMETHAZINE HCL 25 MG PO TABS: ORAL_TABLET | ORAL | Status: DC

## 2013-05-17 MED ORDER — ACETAMINOPHEN-CODEINE 300-30 MG PO TABS: 1.0000 | ORAL_TABLET | ORAL | Status: DC | PRN

## 2013-05-17 NOTE — Progress Notes (Signed)
Patient ID: [name], female   DOB: 1931-02-18, 80 y.o.   MRN: [id number] SUBJECTIVE: CC: Chief Complaint  Patient presents with  . Follow-up    4 MONTH FOLLOW UP CHRONIC PROBLEMS . DISCUSS FENFOFIBRATE AND PROMETHAZINE     HPI: Patient is here for follow up of hyperlipidemia/CAD/HTN/: denies Headache;denies Chest Pain;denies weakness;denies Shortness of Breath and orthopnea;denies Visual changes;denies palpitations;denies cough;denies pedal edema;denies symptoms of TIA or stroke;deniesClaudication symptoms. admits to Compliance with medications; denies Problems with medications.   Doing fairly well on present regimen. No complaints. Fenofibrate too expensive. Needs refills of phenergan. The only thing that works for her for sleep and restlessness.  Past Medical History  Diagnosis Date  . CAD (coronary artery disease)   . HTN (hypertension)   . HLD (hyperlipidemia)   . Skin cancer   . Carotid stenosis     40-59% bilateral   Past Surgical History  Procedure Laterality Date  . Coronary artery bypass graft  1996    LIMA to LAD, SVG to PDA and SVG to D1   . Coronary artery bypass graft  2010    cath 2 vessel CAD with 2 of 3 bypass grafts occluded. had a patent LIMA to LAD  . Abdominal hysterectomy  1974  . Baldder resuspension  1988  . Tonsillectomy    . Skin cancer resected     History   Social History  . Marital Status: Single    Spouse Name: N/A    Number of Children: N/A  . Years of Education: N/A   Occupational History  . retired    Social History Main Topics  . Smoking status: Former Smoker -- 2.00 packs/day for 20 years    Types: Cigarettes    Quit date: 10/25/1977  . Smokeless tobacco: Not on file     Comment: smoked 2ppd for 15 years; quit in 1979  . Alcohol Use: No  . Drug Use: No  . Sexual Activity: Not on file   Other Topics Concern  . 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    Family History  Problem Relation Age of Onset  . Heart disease Brother     multiple; ealry onset  . Heart disease Sister     multiple; early onset   . Breast cancer Sister   . Colon cancer Sister   . Emphysema Daughter    Current Outpatient Prescriptions on File Prior to Visit  Medication Sig Dispense Refill  . amLODipine (NORVASC) 10 MG tablet TAKE 1 TABLET EVERY DAY  30 tablet  5  . Calcium Carbonate-Vitamin D (CALCIUM 600/VITAMIN D) 600-400 MG-UNIT per tablet Take 1 tablet by mouth 2 (two) times daily.        . citalopram (CELEXA) 20 MG tablet Take 1 tablet (20 mg total) by mouth daily.  90 tablet  0  . fluticasone (FLONASE) 50 MCG/ACT nasal spray Place 2 sprays into the nose daily.  48 g  1  . isosorbide mononitrate (IMDUR) 60 MG 24 hr tablet TAKE 1 TABLET (60 MG TOTAL) BY MOUTH DAILY.  90 tablet  0  . loratadine (CLARITIN) 10 MG tablet Take 10 mg by mouth daily.        . Melatonin-Pyridoxine (MELATIN) 3-1 MG TABS Take 2 tablets by mouth at bedtime.        . metoprolol succinate (TOPROL-XL) 50 MG 24 hr tablet Take 1  tablet (50 mg total) by mouth daily.  90 tablet  2  . Multiple Vitamin (MULTIVITAMIN) tablet Take 1 tablet by mouth 2 (two) times daily.       . Omega-3 Fatty Acids (FISH OIL) 1000 MG CAPS Take 2 capsules by mouth 2 (two) times daily.        Marland Kitchen omeprazole (PRILOSEC) 20 MG capsule TAKE 1 CAPSULE (20 MG TOTAL) BY MOUTH DAILY.  90 capsule  0  . potassium chloride (MICRO-K) 10 MEQ CR capsule TAKE 1 CAPSULE (10 MEQ TOTAL) BY MOUTH DAILY.  90 capsule  0  . triamterene-hydrochlorothiazide (MAXZIDE) 75-50 MG per tablet take one daily      . vitamin C (ASCORBIC ACID) 500 MG tablet Take 1,000 mg by mouth 2 (two) times daily.         No current facility-administered medications on file prior to visit.   Allergies  Allergen Reactions  . Lisinopril   . Lunesta [Eszopiclone]   . Simvastatin   . Statins     lipitor crestor  . Ultram [Tramadol]     Immunization History  Administered Date(s) Administered  . Influenza Split 04/25/2012  . Influenza-Unspecified 12/24/2012  . Pneumococcal-Unspecified 04/26/2003  . Tdap 08/24/2010   Prior to Admission medications   Medication Sig Start Date End Date Taking? Authorizing Provider  Acetaminophen-Codeine (TYLENOL/CODEINE #3) 300-30 MG per tablet Take 1 tablet by mouth every 4 (four) hours as needed. 02/22/13  Yes Vernie Shanks, MD  amLODipine (NORVASC) 10 MG tablet TAKE 1 TABLET EVERY DAY 01/07/13  Yes Vernie Shanks, MD  Calcium Carbonate-Vitamin D (CALCIUM 600/VITAMIN D) 600-400 MG-UNIT per tablet Take 1 tablet by mouth 2 (two) times daily.     Yes Historical Provider, MD  citalopram (CELEXA) 20 MG tablet Take 1 tablet (20 mg total) by mouth daily. 01/18/13  Yes Chipper Herb, MD  fenofibrate 54 MG tablet Take 1 tablet (54 mg total) by mouth daily. 01/18/13  Yes Chipper Herb, MD  fluticasone [facility]) 50 MCG/ACT nasal spray Place 2 sprays into the nose daily. 01/18/13  Yes Chipper Herb, MD  isosorbide mononitrate (IMDUR) 60 MG 24 hr tablet TAKE 1 TABLET (60 MG TOTAL) BY MOUTH DAILY. 05/03/13  Yes Vernie Shanks, MD  loratadine (CLARITIN) 10 MG tablet Take 10 mg by mouth daily.     Yes Historical Provider, MD  Melatonin-Pyridoxine (MELATIN) 3-1 MG TABS Take 2 tablets by mouth at bedtime.     Yes Historical Provider, MD  metoprolol succinate (TOPROL-XL) 50 MG 24 hr tablet Take 1 tablet (50 mg total) by mouth daily. 01/18/13  Yes Chipper Herb, MD  Multiple Vitamin (MULTIVITAMIN) tablet Take 1 tablet by mouth 2 (two) times daily.    Yes Historical Provider, MD  Omega-3 Fatty Acids (FISH OIL) 1000 MG CAPS Take 2 capsules by mouth 2 (two) times daily.     Yes Historical Provider, MD  omeprazole (PRILOSEC) 20 MG capsule TAKE 1 CAPSULE (20 MG TOTAL) BY MOUTH DAILY. 05/03/13  Yes Vernie Shanks, MD  potassium chloride (MICRO-K) 10 MEQ CR capsule TAKE 1 CAPSULE (10 MEQ TOTAL) BY MOUTH DAILY. 05/10/13   Yes Vernie Shanks, MD  promethazine (PHENERGAN) 25 MG tablet TAKE 2 TABLETS AT BEDTIME 02/22/13  Yes Vernie Shanks, MD  triamterene-hydrochlorothiazide [facility]) 75-50 MG per tablet take one daily 01/07/13  Yes Vernie Shanks, MD  vitamin C (ASCORBIC ACID) 500 MG tablet Take 1,000 mg by mouth 2 (two) times daily.  Yes Historical Provider, MD     ROS: As above in the HPI. All other systems are stable or negative.  OBJECTIVE: APPEARANCE:  Patient in no acute distress.The patient appeared well nourished and normally developed. Acyanotic. Waist: VITAL SIGNS:BP 111/59  Pulse 69  Temp(Src) 98 F (36.7 C) (Oral)  Ht 5' 1.5" (1.562 m)  Wt 150 lb 9.6 oz (68.312 kg)  BMI 28.00 kg/m2 Native Female  SKIN: warm and  Dry without overt rashes, tattoos and scars  HEAD and Neck: without JVD, Head and scalp: normal Eyes:No scleral icterus. Fundi normal, eye movements normal. Ears: Auricle normal, canal normal, Tympanic membranes normal, insufflation normal. Nose: normal Throat: normal Neck & thyroid: normal  CHEST & LUNGS: Chest wall: normal Lungs: Clear  CVS: Reveals the PMI to be normally located. Regular rhythm, First and Second Heart sounds are normal,  absence of murmurs, rubs or gallops. Peripheral vasculature: Radial pulses: normal Dorsal pedis pulses: normal Posterior pulses: normal  ABDOMEN:  Appearance: normal Benign, no organomegaly, no masses, no Abdominal Aortic enlargement. No Guarding , no rebound. No Bruits. Bowel sounds: normal  RECTAL: N/A GU: N/A  EXTREMETIES: nonedematous.  MUSCULOSKELETAL:  Spine: reduced ROM with moild discomfort. Joints: arthritic knees  NEUROLOGIC: oriented to time,place and person; nonfocal. Strength is normal Sensory is normal Reflexes are normal Cranial Nerves are normal.  ASSESSMENT:  CAD  HYPERLIPIDEMIA - Plan: NMR, lipoprofile, gemfibrozil (LOPID) 600 MG tablet  Obesity, unspecified  Essential hypertension,  benign - Plan: CMP14+EGFR, CANCELED: POCT UA - Microscopic Only, CANCELED: POCT urinalysis dipstick  Elevated serum creatinine  CAROTID BRUIT  DYSPNEA  CORONARY ARTERY BYPASS GRAFT, THREE VESSEL, HX OF  Insomnia - Plan: promethazine (PHENERGAN) 25 MG tablet  Chronic pain syndrome - Plan: Acetaminophen-Codeine (TYLENOL/CODEINE #3) 300-30 MG per tablet  PLAN:      Dr Paula Libra Recommendations  For nutrition information, I recommend books:  1).Eat to Live by Dr Excell Seltzer. 2).Prevent and Reverse Heart Disease by Dr Karl Luke. 3) Dr Janene Harvey Book:  Program to Reverse Diabetes  Exercise recommendations are:  If unable to walk, then the patient can exercise in a chair 3 times a day. By flapping arms like a bird gently and raising legs outwards to the front.  If ambulatory, the patient can go for walks for 30 minutes 3 times a week. Then increase the intensity and duration as tolerated.  Goal is to try to attain exercise frequency to 5 times a week.  If applicable: Best to perform resistance exercises (machines or weights) 2 days a week and cardio type exercises 3 days per week.  Orders Placed This Encounter  Procedures  . CMP14+EGFR  . NMR, lipoprofile   Meds ordered this encounter  Medications  . promethazine (PHENERGAN) 25 MG tablet    Sig: TAKE 2 TABLETS AT BEDTIME    Dispense:  60 tablet    Refill:  3  . Acetaminophen-Codeine (TYLENOL/CODEINE #3) 300-30 MG per tablet    Sig: Take 1 tablet by mouth every 4 (four) hours as needed.    Dispense:  60 tablet    Refill:  3  . gemfibrozil (LOPID) 600 MG tablet    Sig: Take 1 tablet (600 mg total) by mouth 2 (two) times daily before a meal.    Dispense:  60 tablet    Refill:  3   Medications Discontinued During This Encounter  Medication Reason  . promethazine (PHENERGAN) 25 MG tablet Reorder  . Acetaminophen-Codeine (TYLENOL/CODEINE #3) 300-30 MG  per tablet Reorder  . fenofibrate 54 MG tablet  Change in therapy   Return in about 3 months (around 08/15/2013) for Recheck medical problems.  Kandance Yano P. Jacelyn Grip, M.D.

## 2013-05-17 NOTE — Patient Instructions (Signed)
      Dr Lyllie Cobbins's Recommendations  For nutrition information, I recommend books:  1).Eat to Live by Dr Joel Fuhrman. 2).Prevent and Reverse Heart Disease by Dr Caldwell Esselstyn. 3) Dr Neal Barnard's Book:  Program to Reverse Diabetes  Exercise recommendations are:  If unable to walk, then the patient can exercise in a chair 3 times a day. By flapping arms like a bird gently and raising legs outwards to the front.  If ambulatory, the patient can go for walks for 30 minutes 3 times a week. Then increase the intensity and duration as tolerated.  Goal is to try to attain exercise frequency to 5 times a week.  If applicable: Best to perform resistance exercises (machines or weights) 2 days a week and cardio type exercises 3 days per week.  

## 2013-05-19 LAB — NMR, LIPOPROFILE
Cholesterol: 277 mg/dL — ABNORMAL HIGH (ref <200)
HDL Cholesterol by NMR: 73 mg/dL (ref >=40)
HDL Particle Number: 42.2 umol/L (ref >=30.5)
LDL Particle Number: 2206 nmol/L — ABNORMAL HIGH (ref <1000)
LDL Size: 22.2 nm (ref >20.5)
LDLC SERPL CALC-MCNC: 176 mg/dL — ABNORMAL HIGH (ref <100)
LP-IR Score: <25 (ref <=45)
Small LDL Particle Number: 94 nmol/L (ref <=527)
Triglycerides by NMR: 138 mg/dL (ref <150)

## 2013-05-19 LAB — CMP14+EGFR
ALT: 18 IU/L (ref 0–32)
AST: 23 IU/L (ref 0–40)
Albumin/Globulin Ratio: 2.1 (ref 1.1–2.5)
Albumin: 4.5 g/dL (ref 3.5–4.7)
Alkaline Phosphatase: 49 IU/L (ref 39–117)
BUN/Creatinine Ratio: 26 (ref 11–26)
BUN: 38 mg/dL — ABNORMAL HIGH (ref 8–27)
CO2: 24 mmol/L (ref 18–29)
Calcium: 10 mg/dL (ref 8.7–10.3)
Chloride: 90 mmol/L — ABNORMAL LOW (ref 97–108)
Creatinine, Ser: 1.47 mg/dL — ABNORMAL HIGH (ref 0.57–1.00)
GFR calc Af Amer: 38 mL/min/{1.73_m2} — ABNORMAL LOW (ref >59)
GFR calc non Af Amer: 33 mL/min/{1.73_m2} — ABNORMAL LOW (ref >59)
Globulin, Total: 2.1 g/dL (ref 1.5–4.5)
Glucose: 93 mg/dL (ref 65–99)
Potassium: 4.4 mmol/L (ref 3.5–5.2)
Sodium: 131 mmol/L — ABNORMAL LOW (ref 134–144)
Total Bilirubin: 0.3 mg/dL (ref 0.0–1.2)
Total Protein: 6.6 g/dL (ref 6.0–8.5)

## 2013-05-22 ENCOUNTER — Other Ambulatory Visit: Payer: Self-pay | Admitting: Family Medicine

## 2013-05-24 ENCOUNTER — Other Ambulatory Visit: Payer: Self-pay | Admitting: *Deleted

## 2013-05-24 ENCOUNTER — Ambulatory Visit (HOSPITAL_COMMUNITY): Payer: Medicare Other | Attending: Internal Medicine

## 2013-05-24 DIAGNOSIS — E785 Hyperlipidemia, unspecified: Secondary | ICD-10-CM | POA: Insufficient documentation

## 2013-05-24 DIAGNOSIS — I6529 Occlusion and stenosis of unspecified carotid artery: Secondary | ICD-10-CM | POA: Insufficient documentation

## 2013-05-24 DIAGNOSIS — I10 Essential (primary) hypertension: Secondary | ICD-10-CM | POA: Diagnosis not present

## 2013-05-24 DIAGNOSIS — I2510 Atherosclerotic heart disease of native coronary artery without angina pectoris: Secondary | ICD-10-CM | POA: Insufficient documentation

## 2013-05-24 DIAGNOSIS — R0989 Other specified symptoms and signs involving the circulatory and respiratory systems: Secondary | ICD-10-CM | POA: Insufficient documentation

## 2013-05-24 DIAGNOSIS — Z951 Presence of aortocoronary bypass graft: Secondary | ICD-10-CM | POA: Diagnosis not present

## 2013-05-24 DIAGNOSIS — I658 Occlusion and stenosis of other precerebral arteries: Secondary | ICD-10-CM | POA: Diagnosis not present

## 2013-05-24 MED ORDER — TRIAMTERENE-HCTZ 75-50 MG PO TABS: ORAL_TABLET | ORAL | Status: DC

## 2013-05-27 ENCOUNTER — Other Ambulatory Visit: Payer: Self-pay | Admitting: Family Medicine

## 2013-06-07 ENCOUNTER — Ambulatory Visit: Payer: Medicare Other | Admitting: Pulmonary Disease

## 2013-06-17 ENCOUNTER — Other Ambulatory Visit: Payer: Self-pay | Admitting: *Deleted

## 2013-06-17 MED ORDER — AMLODIPINE BESYLATE 10 MG PO TABS: ORAL_TABLET | ORAL | Status: DC

## 2013-06-19 ENCOUNTER — Other Ambulatory Visit: Payer: Self-pay | Admitting: Family Medicine

## 2013-06-25 ENCOUNTER — Other Ambulatory Visit: Payer: Self-pay | Admitting: Family Medicine

## 2013-06-27 ENCOUNTER — Ambulatory Visit: Payer: Medicare Other | Admitting: Family Medicine

## 2013-07-05 ENCOUNTER — Ambulatory Visit: Payer: Medicare Other | Admitting: Family Medicine

## 2013-07-05 ENCOUNTER — Telehealth: Payer: Self-pay | Admitting: Family Medicine

## 2013-07-05 NOTE — Telephone Encounter (Signed)
Wants appt to see dr. Jacelyn Grip before April please call patient

## 2013-07-05 NOTE — Telephone Encounter (Signed)
Pt.notified

## 2013-07-10 ENCOUNTER — Other Ambulatory Visit: Payer: Self-pay | Admitting: Family Medicine

## 2013-07-11 ENCOUNTER — Telehealth: Payer: Self-pay | Admitting: Family Medicine

## 2013-07-11 ENCOUNTER — Other Ambulatory Visit: Payer: Self-pay | Admitting: Family Medicine

## 2013-07-11 MED ORDER — OMEPRAZOLE 20 MG PO CPDR: 20.0000 mg | DELAYED_RELEASE_CAPSULE | Freq: Two times a day (BID) | ORAL | Status: DC

## 2013-07-11 NOTE — Telephone Encounter (Signed)
I didn't see this documented on last office note.

## 2013-07-11 NOTE — Telephone Encounter (Signed)
Call patient : Prescription refilled & sent to pharmacy in [place]. 

## 2013-07-12 NOTE — Telephone Encounter (Signed)
Pt aware.

## 2013-07-22 ENCOUNTER — Encounter: Payer: Self-pay | Admitting: Family Medicine

## 2013-07-22 ENCOUNTER — Ambulatory Visit (INDEPENDENT_AMBULATORY_CARE_PROVIDER_SITE_OTHER): Payer: Medicare Other | Admitting: Family Medicine

## 2013-07-22 VITALS — BP 132/52 | HR 61 | Temp 98.1°F | Ht 61.0 in | Wt 149.2 lb

## 2013-07-22 DIAGNOSIS — E785 Hyperlipidemia, unspecified: Secondary | ICD-10-CM

## 2013-07-22 DIAGNOSIS — I10 Essential (primary) hypertension: Secondary | ICD-10-CM

## 2013-07-22 DIAGNOSIS — G4700 Insomnia, unspecified: Secondary | ICD-10-CM

## 2013-07-22 DIAGNOSIS — I2510 Atherosclerotic heart disease of native coronary artery without angina pectoris: Secondary | ICD-10-CM | POA: Diagnosis not present

## 2013-07-22 NOTE — Progress Notes (Signed)
Patient ID: [name], female   DOB: 1930/10/24, 80 y.o.   MRN: [id number] SUBJECTIVE: CC: Chief Complaint  Patient presents with  . Follow-up    here to get prior authorization for her promethazine  . states she takes this for sleep     HPI: Patient is here for follow up of hyperlipidemia: denies Headache;denies Chest Pain;denies weakness;denies Shortness of Breath and orthopnea;denies Visual changes;denies palpitations;denies cough;denies pedal edema;denies symptoms of TIA or stroke;deniesClaudication symptoms. admits to Compliance with medications; Problems with medications.Lopid caused constipation.   Insomnia: needs prior authorization for promethazine.it is the only thing that works. She has tried [place], and other benzodiazepines in the past and they did not work for her. And there was the risk for addiction. She has no side effects with promethazine.   Past Medical History  Diagnosis Date  . CAD (coronary artery disease)   . HTN (hypertension)   . HLD (hyperlipidemia)   . Skin cancer   . Carotid stenosis     40-59% bilateral   Past Surgical History  Procedure Laterality Date  . Coronary artery bypass graft  1996    LIMA to LAD, SVG to PDA and SVG to D1   . Coronary artery bypass graft  2010    cath 2 vessel CAD with 2 of 3 bypass grafts occluded. had a patent LIMA to LAD  . Abdominal hysterectomy  1974  . Baldder resuspension  1988  . Tonsillectomy    . Skin cancer resected     History   Social History  . Marital Status: Single    Spouse Name: N/A    Number of Children: N/A  . Years of Education: N/A   Occupational History  . retired    Social History Main Topics  . Smoking status: Former Smoker -- 2.00 packs/day for 20 years    Types: Cigarettes    Quit date: 10/25/1977  . Smokeless tobacco: Not on file     Comment: smoked 2ppd for 15 years; quit in 1979  . Alcohol Use: No  . Drug Use: No  . Sexual Activity: Not on file   Other Topics Concern   . 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    Family History  Problem Relation Age of Onset  . Heart disease Brother     multiple; ealry onset  . Heart disease Sister     multiple; early onset   . Breast cancer Sister   . Colon cancer Sister   . Emphysema Daughter    Current Outpatient Prescriptions on File Prior to Visit  Medication Sig Dispense Refill  . Acetaminophen-Codeine (TYLENOL/CODEINE #3) 300-30 MG per tablet Take 1 tablet by mouth every 4 (four) hours as needed.  60 tablet  3  . amLODipine (NORVASC) 10 MG tablet TAKE 1 TABLET EVERY DAY  90 tablet  1  . Calcium Carbonate-Vitamin D (CALCIUM 600/VITAMIN D) 600-400 MG-UNIT per tablet Take 1 tablet by mouth 2 (two) times daily.        . citalopram (CELEXA) 20 MG tablet TAKE 1 TABLET (20 MG TOTAL) BY MOUTH DAILY.  90 tablet  0  . fluticasone (FLONASE) 50 MCG/ACT nasal spray Place 2 sprays into the nose daily.  48 g  1  . isosorbide mononitrate (IMDUR) 60 MG 24 hr tablet TAKE 1 TABLET (60 MG TOTAL) BY MOUTH DAILY.  90 tablet  1  .  loratadine (CLARITIN) 10 MG tablet Take 10 mg by mouth daily.        . Melatonin-Pyridoxine (MELATIN) 3-1 MG TABS Take 2 tablets by mouth at bedtime.        . metoprolol succinate (TOPROL-XL) 50 MG 24 hr tablet Take 1 tablet (50 mg total) by mouth daily.  90 tablet  2  . Multiple Vitamin (MULTIVITAMIN) tablet Take 1 tablet by mouth 2 (two) times daily.       . Omega-3 Fatty Acids (FISH OIL) 1000 MG CAPS Take 2 capsules by mouth 2 (two) times daily.        Marland Kitchen omeprazole (PRILOSEC) 20 MG capsule Take 1 capsule (20 mg total) by mouth 2 (two) times daily before a meal.  180 capsule  1  . potassium chloride (MICRO-K) 10 MEQ CR capsule TAKE 1 CAPSULE (10 MEQ TOTAL) BY MOUTH DAILY.  90 capsule  0  . promethazine (PHENERGAN) 25 MG tablet TAKE 2 TABLETS AT BEDTIME  60 tablet  3  . triamterene-hydrochlorothiazide (MAXZIDE) 75-50 MG per  tablet take one daily  90 tablet  1  . vitamin C (ASCORBIC ACID) 500 MG tablet Take 1,000 mg by mouth 2 (two) times daily.        Marland Kitchen gemfibrozil (LOPID) 600 MG tablet Take 1 tablet (600 mg total) by mouth 2 (two) times daily before a meal.  60 tablet  3   No current facility-administered medications on file prior to visit.   Allergies  Allergen Reactions  . Lisinopril   . Lunesta [Eszopiclone]   . Simvastatin   . Statins     lipitor crestor  . Ultram [Tramadol]    Immunization History  Administered Date(s) Administered  . Influenza Split 04/25/2012  . Influenza-Unspecified 12/24/2012  . Pneumococcal-Unspecified 04/26/2003  . Tdap 08/24/2010   Prior to Admission medications   Medication Sig Start Date End Date Taking? Authorizing Provider  Acetaminophen-Codeine (TYLENOL/CODEINE #3) 300-30 MG per tablet Take 1 tablet by mouth every 4 (four) hours as needed. 05/17/13  Yes Vernie Shanks, MD  amLODipine (NORVASC) 10 MG tablet TAKE 1 TABLET EVERY DAY 06/17/13  Yes Vernie Shanks, MD  Calcium Carbonate-Vitamin D (CALCIUM 600/VITAMIN D) 600-400 MG-UNIT per tablet Take 1 tablet by mouth 2 (two) times daily.     Yes Historical Provider, MD  citalopram (CELEXA) 20 MG tablet TAKE 1 TABLET (20 MG TOTAL) BY MOUTH DAILY. 05/22/13  Yes Vernie Shanks, MD  fluticasone (FLONASE) 50 MCG/ACT nasal spray Place 2 sprays into the nose daily. 01/18/13  Yes Chipper Herb, MD  isosorbide mononitrate (IMDUR) 60 MG 24 hr tablet TAKE 1 TABLET (60 MG TOTAL) BY MOUTH DAILY. 05/27/13  Yes Vernie Shanks, MD  loratadine (CLARITIN) 10 MG tablet Take 10 mg by mouth daily.     Yes Historical Provider, MD  Melatonin-Pyridoxine (MELATIN) 3-1 MG TABS Take 2 tablets by mouth at bedtime.     Yes Historical Provider, MD  metoprolol succinate (TOPROL-XL) 50 MG 24 hr tablet Take 1 tablet (50 mg total) by mouth daily. 01/18/13  Yes Chipper Herb, MD  Multiple Vitamin (MULTIVITAMIN) tablet Take 1 tablet by mouth 2 (two) times daily.     Yes Historical Provider, MD  Omega-3 Fatty Acids (FISH OIL) 1000 MG CAPS Take 2 capsules by mouth 2 (two) times daily.     Yes Historical Provider, MD  omeprazole (PRILOSEC) 20 MG capsule Take 1 capsule (20 mg total) by mouth 2 (two) times daily before  a meal. 07/11/13  Yes Vernie Shanks, MD  potassium chloride (MICRO-K) 10 MEQ CR capsule TAKE 1 CAPSULE (10 MEQ TOTAL) BY MOUTH DAILY. 05/10/13  Yes Vernie Shanks, MD  promethazine (PHENERGAN) 25 MG tablet TAKE 2 TABLETS AT BEDTIME 05/17/13  Yes Vernie Shanks, MD  triamterene-hydrochlorothiazide [facility]) 75-50 MG per tablet take one daily 05/24/13  Yes Vernie Shanks, MD  vitamin C (ASCORBIC ACID) 500 MG tablet Take 1,000 mg by mouth 2 (two) times daily.     Yes Historical Provider, MD  gemfibrozil (LOPID) 600 MG tablet Take 1 tablet (600 mg total) by mouth 2 (two) times daily before a meal. 05/17/13   Vernie Shanks, MD     ROS: As above in the HPI. All other systems are stable or negative.  OBJECTIVE: APPEARANCE:  Patient in no acute distress.The patient appeared well nourished and normally developed. Acyanotic. Waist: VITAL SIGNS:BP 132/52  Pulse 61  Temp(Src) 98.1 F (36.7 C) (Oral)  Ht 5\' 1"  (1.549 m)  Wt 149 lb 3.2 oz (67.677 kg)  BMI 28.21 kg/m2  Female  SKIN: warm and  Dry without overt rashes, tattoos and scars  HEAD and Neck: without JVD, Head and scalp: normal Eyes:No scleral icterus. Fundi normal, eye movements normal. Ears: Auricle normal, canal normal, Tympanic membranes normal, insufflation normal. Nose: normal Throat: normal Neck & thyroid: normal  CHEST & LUNGS: Chest wall: normal Lungs: Clear  CVS: Reveals the PMI to be normally located. Regular rhythm, First and Second Heart sounds are normal,  absence of murmurs, rubs or gallops. Peripheral vasculature: Radial pulses: normal Dorsal pedis pulses: normal Posterior pulses: normal  ABDOMEN:  Appearance: normal Benign, no organomegaly, no masses, no  Abdominal Aortic enlargement. No Guarding , no rebound. No Bruits. Bowel sounds: normal  RECTAL: N/A GU: N/A  EXTREMETIES: nonedematous.  MUSCULOSKELETAL:  Spine: normal Joints: intact  NEUROLOGIC: oriented to time,place and person; nonfocal. Strength is normal Sensory is normal Reflexes are normal Cranial Nerves are normal.  ASSESSMENT:  Insomnia  HYPERLIPIDEMIA  Essential hypertension, benign Quality of sleep improved with promethazine  PLAN: Will seek approval and authorization to continue promethazine. This has been the only medication that has worked without any untoward side effects.  No orders of the defined types were placed in this encounter.   No orders of the defined types were placed in this encounter.   Medications Discontinued During This Encounter  Medication Reason  . isosorbide mononitrate (IMDUR) 60 MG 24 hr tablet Duplicate   Return if symptoms worsen or fail to improve, for Recheck medical problems. keep her routine follow up appointment.   Gabrielle Wakeland P. Jacelyn Grip, M.D.

## 2013-08-01 ENCOUNTER — Other Ambulatory Visit: Payer: Self-pay | Admitting: Family Medicine

## 2013-08-15 ENCOUNTER — Encounter: Payer: Self-pay | Admitting: Family Medicine

## 2013-08-15 ENCOUNTER — Ambulatory Visit (INDEPENDENT_AMBULATORY_CARE_PROVIDER_SITE_OTHER): Payer: Medicare Other | Admitting: Family Medicine

## 2013-08-15 VITALS — BP 126/61 | HR 65 | Temp 98.9°F | Ht 61.0 in | Wt 147.2 lb

## 2013-08-15 DIAGNOSIS — E785 Hyperlipidemia, unspecified: Secondary | ICD-10-CM

## 2013-08-15 DIAGNOSIS — R799 Abnormal finding of blood chemistry, unspecified: Secondary | ICD-10-CM | POA: Diagnosis not present

## 2013-08-15 DIAGNOSIS — E669 Obesity, unspecified: Secondary | ICD-10-CM

## 2013-08-15 DIAGNOSIS — R062 Wheezing: Secondary | ICD-10-CM | POA: Diagnosis not present

## 2013-08-15 DIAGNOSIS — I10 Essential (primary) hypertension: Secondary | ICD-10-CM

## 2013-08-15 DIAGNOSIS — G4700 Insomnia, unspecified: Secondary | ICD-10-CM

## 2013-08-15 DIAGNOSIS — I2510 Atherosclerotic heart disease of native coronary artery without angina pectoris: Secondary | ICD-10-CM | POA: Diagnosis not present

## 2013-08-15 DIAGNOSIS — R7989 Other specified abnormal findings of blood chemistry: Secondary | ICD-10-CM

## 2013-08-15 DIAGNOSIS — Z951 Presence of aortocoronary bypass graft: Secondary | ICD-10-CM

## 2013-08-15 DIAGNOSIS — R0989 Other specified symptoms and signs involving the circulatory and respiratory systems: Secondary | ICD-10-CM

## 2013-08-15 MED ORDER — ALBUTEROL SULFATE HFA 108 (90 BASE) MCG/ACT IN AERS: 2.0000 | INHALATION_SPRAY | Freq: Four times a day (QID) | RESPIRATORY_TRACT | Status: DC | PRN

## 2013-08-15 NOTE — Progress Notes (Signed)
Patient ID: [name], female   DOB: June 24, 1930, 80 y.o.   MRN: [id number] SUBJECTIVE: CC: Chief Complaint  Patient presents with  . Follow-up    3 month follow up states has chronic cough states she takes mucinexbut dont know how long she should take     HPI: Patient is here for follow up of hyperlipidemia/HTN/CAD  denies Headache;denies Chest Pain;denies weakness;denies Shortness of Breath and orthopnea;denies Visual changes;denies palpitations;denies cough;denies pedal edema;denies symptoms of TIA or stroke;deniesClaudication symptoms. admits to Compliance with medications; denies Problems with medications.  Coughs and wheezes.  Exsmoker.  Doing well.  Past Medical History  Diagnosis Date  . CAD (coronary artery disease)   . HTN (hypertension)   . HLD (hyperlipidemia)   . Skin cancer   . Carotid stenosis     40-59% bilateral   Past Surgical History  Procedure Laterality Date  . Coronary artery bypass graft  1996    LIMA to LAD, SVG to PDA and SVG to D1   . Coronary artery bypass graft  2010    cath 2 vessel CAD with 2 of 3 bypass grafts occluded. had a patent LIMA to LAD  . Abdominal hysterectomy  1974  . Baldder resuspension  1988  . Tonsillectomy    . Skin cancer resected     History   Social History  . Marital Status: Single    Spouse Name: N/A    Number of Children: N/A  . Years of Education: N/A   Occupational History  . retired    Social History Main Topics  . Smoking status: Former Smoker -- 2.00 packs/day for 20 years    Types: Cigarettes    Quit date: 10/25/1977  . Smokeless tobacco: Not on file     Comment: smoked 2ppd for 15 years; quit in 1979  . Alcohol Use: No  . Drug Use: No  . Sexual Activity: Not on file   Other Topics Concern  . 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    Family History  Problem Relation Age of Onset  . Heart disease Brother      multiple; ealry onset  . Heart disease Sister     multiple; early onset   . Breast cancer Sister   . Colon cancer Sister   . Emphysema Daughter    Current Outpatient Prescriptions on File Prior to Visit  Medication Sig Dispense Refill  . Acetaminophen-Codeine (TYLENOL/CODEINE #3) 300-30 MG per tablet Take 1 tablet by mouth every 4 (four) hours as needed.  60 tablet  3  . amLODipine (NORVASC) 10 MG tablet TAKE 1 TABLET EVERY DAY  90 tablet  1  . Calcium Carbonate-Vitamin D (CALCIUM 600/VITAMIN D) 600-400 MG-UNIT per tablet Take 1 tablet by mouth 2 (two) times daily.        . citalopram (CELEXA) 20 MG tablet TAKE 1 TABLET (20 MG TOTAL) BY MOUTH DAILY.  90 tablet  0  . fluticasone (FLONASE) 50 MCG/ACT nasal spray PLACE 2 SPRAYS INTO THE NOSE DAILY.  48 g  1  . isosorbide mononitrate (IMDUR) 60 MG 24 hr tablet TAKE 1 TABLET (60 MG TOTAL) BY MOUTH DAILY.  90 tablet  1  . loratadine (CLARITIN) 10 MG tablet Take 10 mg by mouth daily.        . Melatonin-Pyridoxine (MELATIN) 3-1 MG TABS Take 2 tablets by mouth at bedtime.        Marland Kitchen  metoprolol succinate (TOPROL-XL) 50 MG 24 hr tablet Take 1 tablet (50 mg total) by mouth daily.  90 tablet  2  . Multiple Vitamin (MULTIVITAMIN) tablet Take 1 tablet by mouth 2 (two) times daily.       . Omega-3 Fatty Acids (FISH OIL) 1000 MG CAPS Take 2 capsules by mouth 2 (two) times daily.        Marland Kitchen omeprazole (PRILOSEC) 20 MG capsule Take 1 capsule (20 mg total) by mouth 2 (two) times daily before a meal.  180 capsule  1  . potassium chloride (MICRO-K) 10 MEQ CR capsule TAKE 1 CAPSULE (10 MEQ TOTAL) BY MOUTH DAILY.  90 capsule  0  . promethazine (PHENERGAN) 25 MG tablet TAKE 2 TABLETS AT BEDTIME  60 tablet  3  . triamterene-hydrochlorothiazide (MAXZIDE) 75-50 MG per tablet take one daily  90 tablet  1  . vitamin C (ASCORBIC ACID) 500 MG tablet Take 1,000 mg by mouth 2 (two) times daily.        Marland Kitchen gemfibrozil (LOPID) 600 MG tablet Take 1 tablet (600 mg total) by mouth 2  (two) times daily before a meal.  60 tablet  3   No current facility-administered medications on file prior to visit.   Allergies  Allergen Reactions  . Lisinopril   . Lunesta [Eszopiclone]   . Simvastatin   . Statins     lipitor crestor  . Ultram [Tramadol]    Immunization History  Administered Date(s) Administered  . Influenza Split 04/25/2012  . Influenza-Unspecified 12/24/2012  . Pneumococcal-Unspecified 04/26/2003  . Tdap 08/24/2010   Prior to Admission medications   Medication Sig Start Date End Date Taking? Authorizing Provider  Acetaminophen-Codeine (TYLENOL/CODEINE #3) 300-30 MG per tablet Take 1 tablet by mouth every 4 (four) hours as needed. 05/17/13  Yes Vernie Shanks, MD  amLODipine (NORVASC) 10 MG tablet TAKE 1 TABLET EVERY DAY 06/17/13  Yes Vernie Shanks, MD  Calcium Carbonate-Vitamin D (CALCIUM 600/VITAMIN D) 600-400 MG-UNIT per tablet Take 1 tablet by mouth 2 (two) times daily.     Yes Historical Provider, MD  citalopram (CELEXA) 20 MG tablet TAKE 1 TABLET (20 MG TOTAL) BY MOUTH DAILY. 05/22/13  Yes Vernie Shanks, MD  fluticasone (FLONASE) 50 MCG/ACT nasal spray PLACE 2 SPRAYS INTO THE NOSE DAILY. 08/01/13  Yes Vernie Shanks, MD  gemfibrozil (LOPID) 600 MG tablet Take 1 tablet (600 mg total) by mouth 2 (two) times daily before a meal. 05/17/13  Yes Vernie Shanks, MD  isosorbide mononitrate (IMDUR) 60 MG 24 hr tablet TAKE 1 TABLET (60 MG TOTAL) BY MOUTH DAILY. 05/27/13  Yes Vernie Shanks, MD  loratadine (CLARITIN) 10 MG tablet Take 10 mg by mouth daily.     Yes Historical Provider, MD  Melatonin-Pyridoxine (MELATIN) 3-1 MG TABS Take 2 tablets by mouth at bedtime.     Yes Historical Provider, MD  metoprolol succinate (TOPROL-XL) 50 MG 24 hr tablet Take 1 tablet (50 mg total) by mouth daily. 01/18/13  Yes Chipper Herb, MD  Multiple Vitamin (MULTIVITAMIN) tablet Take 1 tablet by mouth 2 (two) times daily.    Yes Historical Provider, MD  Omega-3 Fatty Acids (FISH OIL) 1000  MG CAPS Take 2 capsules by mouth 2 (two) times daily.     Yes Historical Provider, MD  omeprazole (PRILOSEC) 20 MG capsule Take 1 capsule (20 mg total) by mouth 2 (two) times daily before a meal. 07/11/13  Yes Vernie Shanks, MD  potassium  chloride (MICRO-K) 10 MEQ CR capsule TAKE 1 CAPSULE (10 MEQ TOTAL) BY MOUTH DAILY. 05/10/13  Yes Vernie Shanks, MD  promethazine (PHENERGAN) 25 MG tablet TAKE 2 TABLETS AT BEDTIME 05/17/13  Yes Vernie Shanks, MD  triamterene-hydrochlorothiazide [facility]) 75-50 MG per tablet take one daily 05/24/13  Yes Vernie Shanks, MD  vitamin C (ASCORBIC ACID) 500 MG tablet Take 1,000 mg by mouth 2 (two) times daily.     Yes Historical Provider, MD     ROS: As above in the HPI. All other systems are stable or negative.  OBJECTIVE: APPEARANCE:  Patient in no acute distress.The patient appeared well nourished and normally developed. Acyanotic. Waist: VITAL SIGNS:BP 126/61  Pulse 65  Temp(Src) 98.9 F (37.2 C) (Oral)  Ht 5' 1" (1.549 m)  Wt 147 lb 3.2 oz (66.769 kg)  BMI 27.83 kg/m2 Woman.  SKIN: warm and  Dry without overt rashes, tattoos and scars  HEAD and Neck: without JVD, Head and scalp: normal Eyes:No scleral icterus. Fundi normal, eye movements normal. Ears: Auricle normal, canal normal, Tympanic membranes normal, insufflation normal. Nose: normal Throat: normal Neck & thyroid: normal  CHEST & LUNGS: Chest wall: normal Lungs: Clear  CVS: Reveals the PMI to be normally located. Regular rhythm, First and Second Heart sounds are normal,  absence of murmurs, rubs or gallops. Peripheral vasculature: Radial pulses: normal Dorsal pedis pulses: normal Posterior pulses: normal  ABDOMEN:  Appearance:overweight Benign, no organomegaly, no masses, no Abdominal Aortic enlargement. No Guarding , no rebound. No Bruits. Bowel sounds: normal  RECTAL: N/A GU: N/A  EXTREMETIES: nonedematous.  MUSCULOSKELETAL:  Spine: normal Joints:  intact  NEUROLOGIC: oriented to time,place and person; nonfocal. Strength is normal Sensory is normal Reflexes are normal Cranial Nerves are normal. Results for orders placed in visit on 05/17/13  CMP14+EGFR      Result Value Ref Range   Glucose 93  65 - 99 mg/dL   BUN 38 (*) 8 - 27 mg/dL   Creatinine, Ser 1.47 (*) 0.57 - 1.00 mg/dL   GFR calc non Af Amer 33 (*) >59 mL/min/1.73   GFR calc Af Amer 38 (*) >59 mL/min/1.73   BUN/Creatinine Ratio 26  11 - 26   Sodium 131 (*) 134 - 144 mmol/L   Potassium 4.4  3.5 - 5.2 mmol/L   Chloride 90 (*) 97 - 108 mmol/L   CO2 24  18 - 29 mmol/L   Calcium 10.0  8.7 - 10.3 mg/dL   Total Protein 6.6  6.0 - 8.5 g/dL   Albumin 4.5  3.5 - 4.7 g/dL   Globulin, Total 2.1  1.5 - 4.5 g/dL   Albumin/Globulin Ratio 2.1  1.1 - 2.5   Total Bilirubin 0.3  0.0 - 1.2 mg/dL   Alkaline Phosphatase 49  39 - 117 IU/L   AST 23  0 - 40 IU/L   ALT 18  0 - 32 IU/L  NMR, LIPOPROFILE      Result Value Ref Range   LDL Particle Number [id number] (*) <1000 nmol/L   LDLC SERPL CALC-MCNC 176 (*) <100 mg/dL   HDL Cholesterol by NMR 73  >=40 mg/dL   Triglycerides by NMR 138  <150 mg/dL   Cholesterol 277 (*) <200 mg/dL   HDL Particle Number 42.2  >=30.5 umol/L   Small LDL Particle Number 94  <=527 nmol/L   LDL Size 22.2  >20.5 nm   LP-IR Score < 25  <= 45    ASSESSMENT:   Obesity,  unspecified  Insomnia  HYPERLIPIDEMIA - Plan: CMP14+EGFR, NMR, lipoprofile  Essential hypertension, benign - Plan: CMP14+EGFR  Elevated serum creatinine  CAROTID BRUIT  CAD  CORONARY ARTERY BYPASS GRAFT, THREE VESSEL, HX OF  Wheezing - Plan: albuterol (PROVENTIL HFA;VENTOLIN HFA) 108 (90 BASE) MCG/ACT inhaler  PLAN:      Dr Paula Libra Recommendations  For nutrition information, I recommend books:  1).Eat to Live by Dr Excell Seltzer. 2).Prevent and Reverse Heart Disease by Dr Karl Luke. 3) Dr Janene Harvey Book:  Program to Reverse Diabetes  Exercise  recommendations are:  If unable to walk, then the patient can exercise in a chair 3 times a day. By flapping arms like a bird gently and raising legs outwards to the front.  If ambulatory, the patient can go for walks for 30 minutes 3 times a week. Then increase the intensity and duration as tolerated.  Goal is to try to attain exercise frequency to 5 times a week.  If applicable: Best to perform resistance exercises (machines or weights) 2 days a week and cardio type exercises 3 days per week.  Handout in the AVS on wheezing  Orders Placed This Encounter  Procedures  . CMP14+EGFR  . NMR, lipoprofile  continue with same medication regimen.  Healthy lifestyle.  Meds ordered this encounter  Medications  . albuterol (PROVENTIL HFA;VENTOLIN HFA) 108 (90 BASE) MCG/ACT inhaler    Sig: Inhale 2 puffs into the lungs every 6 (six) hours as needed for wheezing or shortness of breath.    Dispense:  1 Inhaler    Refill:  1   There are no discontinued medications. Return in about 2 weeks (around 08/29/2013) for Recheck medical problems/wheezing.  Francis P. Jacelyn Grip, M.D.

## 2013-08-15 NOTE — Patient Instructions (Signed)
      Dr Paula Libra Recommendations  For nutrition information, I recommend books:  1).Eat to Live by Dr Excell Seltzer. 2).Prevent and Reverse Heart Disease by Dr Karl Luke. 3) Dr Janene Harvey Book:  Program to Reverse Diabetes  Exercise recommendations are:  If unable to walk, then the patient can exercise in a chair 3 times a day. By flapping arms like a bird gently and raising legs outwards to the front.  If ambulatory, the patient can go for walks for 30 minutes 3 times a week. Then increase the intensity and duration as tolerated.  Goal is to try to attain exercise frequency to 5 times a week.  If applicable: Best to perform resistance exercises (machines or weights) 2 days a week and cardio type exercises 3 days per week.    Bronchospasm, Adult A bronchospasm is when the tubes that carry air in and out of your lungs (airwarys) spasm or tighten. During a bronchospasm it is hard to breathe. This is because the airways get smaller. A bronchospasm can be triggered by:  Allergies. These may be to animals, pollen, food, or mold.  Infection. This is a common cause of bronchospasm.  Exercise.  Irritants. These include pollution, cigarette smoke, strong odors, aerosol sprays, and paint fumes.  Weather changes.  Stress.  Being emotional. HOME CARE   Always have a plan for getting help. Know when to call your doctor and local emergency services (911 in the [place] Know where you can get emergency care.  Only take medicines as told by your doctor.  If you were prescribed an inhaler or nebulizer machine, ask your doctor how to use it correctly. Always use a spacer with your inhaler if you were given one.  Stay calm during an attack. Try to relax and breathe more slowly.  Control your home environment:  Change your heating and air conditioning filter at least once a month.  Limit your use of fireplaces and wood stoves.  Do not  smoke. Do not  allow smoking in  your home.  Avoid perfumes and fragrances.  Get rid of pests (such as roaches and mice) and their droppings.  Throw away plants if you see mold on them.  Keep your house clean and dust free.  Replace carpet with wood, tile, or vinyl flooring. Carpet can trap dander and dust.  Use allergy-proof pillows, mattress covers, and box spring covers.  Wash bed sheets and blankets every week in hot water. Dry them in a dryer.  Use blankets that are made of polyester or cotton.  Wash hands frequently. GET HELP IF:  You have muscle aches.  You have chest pain.  The thick spit you spit or cough up (sputum) changes from clear or white to yellow, green, gray, or bloody.  The thick spit you spit or cough up gets thicker.  There are problems that may be related to the medicine you are given such as:  A rash.  Itching.  Swelling.  Trouble breathing. GET HELP RIGHT AWAY IF:  You feel you cannot breathe or catch your breath.  You cannot stop coughing.  Your treatment is not helping you breathe better. MAKE SURE YOU:   Understand these instructions.  Will watch your condition.  Will get help right away if you are not doing well or get worse. Document Released: 02/06/2009 Document Revised: 12/12/2012 Document Reviewed: 10/02/2012 [facility] Patient Information 2014 [place].

## 2013-08-16 ENCOUNTER — Other Ambulatory Visit: Payer: Self-pay | Admitting: Family Medicine

## 2013-08-16 DIAGNOSIS — E871 Hypo-osmolality and hyponatremia: Secondary | ICD-10-CM

## 2013-08-16 DIAGNOSIS — E785 Hyperlipidemia, unspecified: Secondary | ICD-10-CM

## 2013-08-16 LAB — CMP14+EGFR
ALT: 17 IU/L (ref 0–32)
AST: 22 IU/L (ref 0–40)
Albumin/Globulin Ratio: 2.1 (ref 1.1–2.5)
Albumin: 4.6 g/dL (ref 3.5–4.7)
Alkaline Phosphatase: 65 IU/L (ref 39–117)
BUN/Creatinine Ratio: 28 — ABNORMAL HIGH (ref 11–26)
BUN: 32 mg/dL — ABNORMAL HIGH (ref 8–27)
CO2: 25 mmol/L (ref 18–29)
Calcium: 9.9 mg/dL (ref 8.7–10.3)
Chloride: 86 mmol/L — ABNORMAL LOW (ref 97–108)
Creatinine, Ser: 1.16 mg/dL — ABNORMAL HIGH (ref 0.57–1.00)
GFR calc Af Amer: 51 mL/min/{1.73_m2} — ABNORMAL LOW (ref >59)
GFR calc non Af Amer: 44 mL/min/{1.73_m2} — ABNORMAL LOW (ref >59)
Globulin, Total: 2.2 g/dL (ref 1.5–4.5)
Glucose: 88 mg/dL (ref 65–99)
Potassium: 4.7 mmol/L (ref 3.5–5.2)
Sodium: 128 mmol/L — ABNORMAL LOW (ref 134–144)
Total Bilirubin: 0.3 mg/dL (ref 0.0–1.2)
Total Protein: 6.8 g/dL (ref 6.0–8.5)

## 2013-08-16 LAB — NMR, LIPOPROFILE
Cholesterol: 333 mg/dL — ABNORMAL HIGH (ref <200)
HDL Cholesterol by NMR: 75 mg/dL (ref >=40)
HDL Particle Number: 28.3 umol/L — ABNORMAL LOW (ref >=30.5)
LDL Particle Number: 2210 nmol/L — ABNORMAL HIGH (ref <1000)
LDL Size: 22.1 nm (ref >20.5)
LDLC SERPL CALC-MCNC: 231 mg/dL — ABNORMAL HIGH (ref <100)
LP-IR Score: <25 (ref <=45)
Small LDL Particle Number: <90 nmol/L (ref <=527)
Triglycerides by NMR: 137 mg/dL (ref <150)

## 2013-08-16 MED ORDER — EZETIMIBE 10 MG PO TABS: 10.0000 mg | ORAL_TABLET | Freq: Every day | ORAL | Status: DC

## 2013-08-21 ENCOUNTER — Other Ambulatory Visit: Payer: Self-pay | Admitting: Family Medicine

## 2013-09-04 ENCOUNTER — Encounter: Payer: Self-pay | Admitting: Family

## 2013-09-04 ENCOUNTER — Ambulatory Visit (INDEPENDENT_AMBULATORY_CARE_PROVIDER_SITE_OTHER): Payer: Medicare Other | Admitting: Family

## 2013-09-04 VITALS — BP 137/59 | HR 62 | Temp 98.8°F | Ht 61.0 in | Wt 148.0 lb

## 2013-09-04 DIAGNOSIS — I2510 Atherosclerotic heart disease of native coronary artery without angina pectoris: Secondary | ICD-10-CM

## 2013-09-04 DIAGNOSIS — J45909 Unspecified asthma, uncomplicated: Secondary | ICD-10-CM

## 2013-09-04 DIAGNOSIS — R062 Wheezing: Secondary | ICD-10-CM

## 2013-09-04 MED ORDER — MONTELUKAST SODIUM 10 MG PO TABS: 10.0000 mg | ORAL_TABLET | Freq: Every day | ORAL | Status: DC

## 2013-09-04 MED ORDER — BUDESONIDE-FORMOTEROL FUMARATE 160-4.5 MCG/ACT IN AERO: 2.0000 | INHALATION_SPRAY | Freq: Two times a day (BID) | RESPIRATORY_TRACT | Status: DC

## 2013-09-04 MED ORDER — ALBUTEROL SULFATE HFA 108 (90 BASE) MCG/ACT IN AERS: 2.0000 | INHALATION_SPRAY | Freq: Four times a day (QID) | RESPIRATORY_TRACT | Status: DC | PRN

## 2013-09-04 NOTE — Progress Notes (Signed)
   Subjective:    Patient ID: [name], female    DOB: 12-15-1930, 80 y.o.   MRN: [id number]  Wheezing  This is a chronic problem. The current episode started more than 1 year ago. The problem occurs constantly. The problem has been waxing and waning. Associated symptoms include coughing, rhinorrhea and shortness of breath. Pertinent negatives include no ear pain, headaches, sore throat or vomiting. The symptoms are aggravated by any activity, pollens, weather changes and smoke. She has tried steroid inhaler for the symptoms. The treatment provided moderate relief. There is no history of asthma or COPD.   *Pt was seen in the office two weeks ago and started on albuterol inhaler. Pt states she has been using it twice a day as it was prescribed. States it helps some, but states the wheezing "comes back easily".  Review of Systems  HENT: Positive for rhinorrhea. Negative for ear pain and sore throat.   Respiratory: Positive for cough, shortness of breath and wheezing.   Gastrointestinal: Negative for vomiting.  Neurological: Negative for headaches.  All other systems reviewed and are negative.      Objective:   Physical Exam  Vitals reviewed. Constitutional: She is oriented to person, place, and time. She appears well-developed and well-nourished. No distress.  HENT:  Head: Normocephalic and atraumatic.  Right Ear: External ear normal.  Left Ear: External ear normal.  Nasal passage and oropharynx erythemas.  Eyes: Pupils are equal, round, and reactive to light.  Neck: Normal range of motion. Neck supple. No thyromegaly present.  Cardiovascular: Normal rate, regular rhythm, normal heart sounds and intact distal pulses.   No murmur heard. Pulmonary/Chest: Effort normal. No respiratory distress. She has wheezes.  Dry cough   Musculoskeletal: Normal range of motion. She exhibits no edema and no tenderness.  Neurological: She is alert and oriented to person, place, and time.  Skin:  Skin is warm and dry.  Psychiatric: She has a normal mood and affect. Her behavior is normal. Judgment and thought content normal.   BP 137/59  Pulse 62  Temp(Src) 98.8 F (37.1 C) (Oral)  Ht 5\' 1"  (1.549 m)  Wt 148 lb (67.132 kg)  BMI 27.98 kg/m2        Assessment & Plan:  1. Wheezing - albuterol (PROVENTIL HFA;VENTOLIN HFA) 108 (90 BASE) MCG/ACT inhaler; Inhale 2 puffs into the lungs every 6 (six) hours as needed for wheezing or shortness of breath.  Dispense: 1 Inhaler; Refill: 6  2. Asthma -Use respiratory precautions when outside (yard work, mowing, wct)   - budesonide-formoterol (SYMBICORT) 160-4.5 MCG/ACT inhaler; Inhale 2 puffs into the lungs 2 (two) times daily.  Dispense: 1 Inhaler; Refill: 3 - albuterol (PROVENTIL HFA;VENTOLIN HFA) 108 (90 BASE) MCG/ACT inhaler; Inhale 2 puffs into the lungs every 6 (six) hours as needed for wheezing or shortness of breath.  Dispense: 1 Inhaler; Refill: 6 - montelukast (SINGULAIR) 10 MG tablet; Take 1 tablet (10 mg total) by mouth at bedtime.  Dispense: 30 tablet; Refill: 6   Continue all meds Diet and exercise encouraged RTO 1 month  [name], FNP

## 2013-09-04 NOTE — Patient Instructions (Signed)
Asthma, Adult Asthma is a recurring condition in which the airways tighten and narrow. Asthma can make it difficult to breathe. It can cause coughing, wheezing, and shortness of breath. Asthma episodes (also called asthma attacks) range from minor to life-threatening. Asthma cannot be cured, but medicines and lifestyle changes can help control it. CAUSES Asthma is believed to be caused by inherited (genetic) and environmental factors, but its exact cause is unknown. Asthma may be triggered by allergens, lung infections, or irritants in the air. Asthma triggers are different for each person. Common triggers include:   Animal dander.  Dust mites.  Cockroaches.  Pollen from trees or grass.  Mold.  Smoke.  Air pollutants such as dust, household cleaners, hair sprays, aerosol sprays, paint fumes, strong chemicals, or strong odors.  Cold air, weather changes, and winds (which increase molds and pollens in the air).  Strong emotional expressions such as crying or laughing hard.  Stress.  Certain medicines (such as aspirin) or types of drugs (such as beta-blockers).  Sulfites in foods and drinks. Foods and drinks that may contain sulfites include dried fruit, potato chips, and sparkling grape juice.  Infections or inflammatory conditions such as the flu, a cold, or an inflammation of the nasal membranes (rhinitis).  Gastroesophageal reflux disease (GERD).  Exercise or strenuous activity. SYMPTOMS Symptoms may occur immediately after asthma is triggered or many hours later. Symptoms include:  Wheezing.  Excessive nighttime or early morning coughing.  Frequent or severe coughing with a common cold.  Chest tightness.  Shortness of breath. DIAGNOSIS  The diagnosis of asthma is made by a review of your medical history and a physical exam. Tests may also be performed. These may include:  Lung function studies. These tests show how much air you breath in and out.  Allergy  tests.  Imaging tests such as X-rays. TREATMENT  Asthma cannot be cured, but it can usually be controlled. Treatment involves identifying and avoiding your asthma triggers. It also involves medicines. There are 2 classes of medicine used for asthma treatment:   Controller medicines. These prevent asthma symptoms from occurring. They are usually taken every day.  Reliever or rescue medicines. These quickly relieve asthma symptoms. They are used as needed and provide short-term relief. Your health care provider will help you create an asthma action plan. An asthma action plan is a written plan for managing and treating your asthma attacks. It includes a list of your asthma triggers and how they may be avoided. It also includes information on when medicines should be taken and when their dosage should be changed. An action plan may also involve the use of a device called a peak flow meter. A peak flow meter measures how well the lungs are working. It helps you monitor your condition. HOME CARE INSTRUCTIONS   Take medicine as directed by your health care provider. Speak with your health care provider if you have questions about how or when to take the medicines.  Use a peak flow meter as directed by your health care provider. Record and keep track of readings.  Understand and use the action plan to help minimize or stop an asthma attack without needing to seek medical care.  Control your home environment in the following ways to help prevent asthma attacks:  Do not smoke. Avoid being exposed to secondhand smoke.  Change your heating and air conditioning filter regularly.  Limit your use of fireplaces and wood stoves.  Get rid of pests (such as roaches and   mice) and their droppings.  Throw away plants if you see mold on them.  Clean your floors and dust regularly. Use unscented cleaning products.  Try to have someone else vacuum for you regularly. Stay out of rooms while they are being  vacuumed and for a short while afterward. If you vacuum, use a dust mask from a hardware store, a double-layered or microfilter vacuum cleaner bag, or a vacuum cleaner with a HEPA filter.  Replace carpet with wood, tile, or vinyl flooring. Carpet can trap dander and dust.  Use allergy-proof pillows, mattress covers, and box spring covers.  Wash bed sheets and blankets every week in hot water and dry them in a dryer.  Use blankets that are made of polyester or cotton.  Clean bathrooms and kitchens with bleach. If possible, have someone repaint the walls in these rooms with mold-resistant paint. Keep out of the rooms that are being cleaned and painted.  Wash hands frequently. SEEK MEDICAL CARE IF:   You have wheezing, shortness of breath, or a cough even if taking medicine to prevent attacks.  The colored mucus you cough up (sputum) is thicker than usual.  Your sputum changes from clear or white to yellow, green, gray, or bloody.  You have any problems that may be related to the medicines you are taking (such as a rash, itching, swelling, or trouble breathing).  You are using a reliever medicine more than 2 3 times per week.  Your peak flow is still at 50 79% of you personal best after following your action plan for 1 hour. SEEK IMMEDIATE MEDICAL CARE IF:   You seem to be getting worse and are unresponsive to treatment during an asthma attack.  You are short of breath even at rest.  You get short of breath when doing very little physical activity.  You have difficulty eating, drinking, or talking due to asthma symptoms.  You develop chest pain.  You develop a fast heartbeat.  You have a bluish color to your lips or fingernails.  You are lightheaded, dizzy, or faint.  Your peak flow is less than 50% of your personal best.  You have a fever or persistent symptoms for more than 2 3 days.  You have a fever and symptoms suddenly get worse. MAKE SURE YOU:   Understand these  instructions.  Will watch your condition.  Will get help right away if you are not doing well or get worse. Document Released: 04/11/2005 Document Revised: 12/12/2012 Document Reviewed: 11/08/2012 [employer] Patient Information 2014 [employer].  

## 2013-09-12 ENCOUNTER — Other Ambulatory Visit: Payer: Self-pay | Admitting: *Deleted

## 2013-09-12 DIAGNOSIS — G4700 Insomnia, unspecified: Secondary | ICD-10-CM

## 2013-09-13 ENCOUNTER — Other Ambulatory Visit: Payer: Self-pay

## 2013-09-13 DIAGNOSIS — G4700 Insomnia, unspecified: Secondary | ICD-10-CM

## 2013-09-13 MED ORDER — PROMETHAZINE HCL 25 MG PO TABS: ORAL_TABLET | ORAL | Status: DC

## 2013-09-13 NOTE — Telephone Encounter (Signed)
Last seen 09/04/13 C Hawks

## 2013-09-25 ENCOUNTER — Ambulatory Visit (INDEPENDENT_AMBULATORY_CARE_PROVIDER_SITE_OTHER): Payer: Medicare Other | Admitting: Nurse Practitioner

## 2013-09-25 ENCOUNTER — Encounter: Payer: Self-pay | Admitting: Nurse Practitioner

## 2013-09-25 VITALS — BP 151/70 | HR 75 | Temp 98.3°F | Ht 63.0 in | Wt 148.0 lb

## 2013-09-25 DIAGNOSIS — N390 Urinary tract infection, site not specified: Secondary | ICD-10-CM

## 2013-09-25 DIAGNOSIS — I2510 Atherosclerotic heart disease of native coronary artery without angina pectoris: Secondary | ICD-10-CM | POA: Diagnosis not present

## 2013-09-25 LAB — POCT UA - MICROSCOPIC ONLY
Casts, Ur, LPF, POC: NEGATIVE
Crystals, Ur, HPF, POC: NEGATIVE
Mucus, UA: NEGATIVE
YEAST UA: NEGATIVE

## 2013-09-25 LAB — POCT URINALYSIS DIPSTICK
BILIRUBIN UA: NEGATIVE
Glucose, UA: NEGATIVE
Ketones, UA: NEGATIVE
NITRITE UA: NEGATIVE
Spec Grav, UA: <=1.005
Urobilinogen, UA: NEGATIVE
pH, UA: 7.5

## 2013-09-25 MED ORDER — CIPROFLOXACIN HCL 500 MG PO TABS: 500.0000 mg | ORAL_TABLET | Freq: Two times a day (BID) | ORAL | Status: DC

## 2013-09-25 NOTE — Progress Notes (Signed)
   Subjective:    Patient ID: [name], female    DOB: 27-Dec-1930, 80 y.o.   MRN: [id number]  HPI Patient in today c/o dysuria , malodorous urine and frequency started about 5 day s ago- has gotten worse.    Review of Systems  Genitourinary: Positive for dysuria, urgency and frequency.       Objective:   Physical Exam  Constitutional: She is oriented to person, place, and time. She appears well-developed and well-nourished.  Cardiovascular: Normal rate and normal heart sounds.   Pulmonary/Chest: Effort normal and breath sounds normal.  Abdominal: Soft. There is tenderness (mild suprapubic pain).  Genitourinary:  No CVA tenderness  Neurological: She is alert and oriented to person, place, and time.  Skin: Skin is warm and dry.  Psychiatric: She has a normal mood and affect. Her behavior is normal. Judgment and thought content normal.    BP 151/70  Pulse 75  Temp(Src) 98.3 F (36.8 C) (Oral)  Ht 5\' 3"  (1.6 m)  Wt 148 lb (67.132 kg)  BMI 26.22 kg/m2  Results for orders placed in visit on 09/25/13  POCT URINALYSIS DIPSTICK      Result Value Ref Range   Color, UA gold     Clarity, UA clear     Glucose, UA negative     Bilirubin, UA negative     Ketones, UA negative     Spec Grav, UA <=1.005     Blood, UA trace     pH, UA 7.5     Protein, UA 1+     Urobilinogen, UA negative     Nitrite, UA negative     Leukocytes, UA large (3+)    POCT UA - MICROSCOPIC ONLY      Result Value Ref Range   WBC, Ur, HPF, POC tntc     RBC, urine, microscopic 5-8     Bacteria, U Microscopic many     Mucus, UA negative     Epithelial cells, urine per micros few     Crystals, Ur, HPF, POC negative     Casts, Ur, LPF, POC negative     Yeast, UA negative           Assessment & Plan:   1. UTI (urinary tract infection)    Meds ordered this encounter  Medications  . ciprofloxacin (CIPRO) 500 MG tablet    Sig: Take 1 tablet (500 mg total) by mouth 2 (two) times daily.   Dispense:  14 tablet    Refill:  0    Order Specific Question:  Supervising Provider    Answer:  Joycelyn Man   Force fluids AZO over the counter X2 days RTO prn Culture pending  Mary-Margaret Hassell Done, FNP

## 2013-09-25 NOTE — Patient Instructions (Signed)
Asymptomatic Bacteriuria, Female Asymptomatic bacteriuria is a significant number of bacteria in your urine that occur without the usual symptoms of burning or frequent urination. The following conditions increase risk of asymptomatic bacteriuria:  Diabetes mellitus.  Advanced age.  Pregnancy in the first trimester.  Kidney stones.  Kidney transplants.  Leaky kidney tube valve in young children (reflux). Treatment for asymptomatic bacteriuria is not required in most people and can lead to other problems such as yeast overgrowth and development of resistant bacteria. However, some people, such as pregnant women, do need treatment to prevent kidney infection. Asymptomatic bacteriuria in pregnancy is also associated with fetal growth restriction, premature labor, and newborn death. HOME CARE INSTRUCTIONS Monitor your bacteriuria for any changes. The following actions may help to alleviate any discomfort you are experiencing:  Drink enough water and fluids to keep your urine clear or pale yellow. Go to the bathroom more frequently to keep your bladder empty.  Keep the area around your vagina and rectum clean. Wipe yourself from front to back after urinating. SEEK IMMEDIATE MEDICAL CARE IF:  You develop signs of an infection such asburning with urination, frequency of voiding, back pain, or fever.  You have blood in the urine.  You develop a fever. Document Released: 04/11/2005 Document Revised: 12/12/2012 Document Reviewed: 10/01/2012 [employer] Patient Information 2014 [employer].  

## 2013-10-02 ENCOUNTER — Ambulatory Visit: Payer: Medicare Other | Admitting: Cardiology

## 2013-10-07 ENCOUNTER — Encounter: Payer: Self-pay | Admitting: Family

## 2013-10-07 ENCOUNTER — Ambulatory Visit (INDEPENDENT_AMBULATORY_CARE_PROVIDER_SITE_OTHER): Payer: Medicare Other | Admitting: Family

## 2013-10-07 VITALS — BP 120/60 | HR 68 | Temp 98.4°F | Wt 147.0 lb

## 2013-10-07 DIAGNOSIS — E559 Vitamin D deficiency, unspecified: Secondary | ICD-10-CM | POA: Diagnosis not present

## 2013-10-07 DIAGNOSIS — I2510 Atherosclerotic heart disease of native coronary artery without angina pectoris: Secondary | ICD-10-CM | POA: Diagnosis not present

## 2013-10-07 DIAGNOSIS — R252 Cramp and spasm: Secondary | ICD-10-CM

## 2013-10-07 DIAGNOSIS — G894 Chronic pain syndrome: Secondary | ICD-10-CM | POA: Diagnosis not present

## 2013-10-07 DIAGNOSIS — Z09 Encounter for follow-up examination after completed treatment for conditions other than malignant neoplasm: Secondary | ICD-10-CM

## 2013-10-07 MED ORDER — ACETAMINOPHEN-CODEINE 300-30 MG PO TABS: 1.0000 | ORAL_TABLET | ORAL | Status: DC | PRN

## 2013-10-07 NOTE — Progress Notes (Signed)
   Subjective:    Patient ID: [name], female    DOB: 20-May-1930, 80 y.o.   MRN: [id number]  HPI Pt presents to office for follow-up appointment for wheezing, SOB, coughing, and excess mucus. Pt currently taking Symbicort BID and singulair daily. States this is has greatly improved her breathing. States her mucus has decreased, her wheezing is gone and she is bearly coughing. States she has only had to use her albuterol inhaler a "couple of times".    Pt has a new complaint of cramping in her feet and ankles at night that occurs almost every night. Pt states its a 8 out 10 pain when it occurs.    Review of Systems  HENT: Negative.   Respiratory: Negative for shortness of breath and wheezing.   Gastrointestinal: Negative.   Genitourinary: Negative.   Musculoskeletal: Negative.   Neurological: Negative.   Hematological: Negative.   All other systems reviewed and are negative.      Objective:   Physical Exam  Nursing note and vitals reviewed. Constitutional: She is oriented to person, place, and time. She appears well-developed and well-nourished. No distress.  HENT:  Head: Normocephalic and atraumatic.  Right Ear: External ear normal.  Mouth/Throat: Oropharynx is clear and moist.  Eyes: Pupils are equal, round, and reactive to light.  Neck: Normal range of motion. Neck supple. No thyromegaly present.  Cardiovascular: Normal rate, regular rhythm, normal heart sounds and intact distal pulses.   No murmur heard. Pulmonary/Chest: Effort normal and breath sounds normal. No respiratory distress. She has no wheezes.  Abdominal: Soft. Bowel sounds are normal. She exhibits no distension. There is no tenderness.  Musculoskeletal: Normal range of motion. She exhibits no edema and no tenderness.  Neurological: She is alert and oriented to person, place, and time. She has normal reflexes. No cranial nerve deficit.  Skin: Skin is warm and dry.  Psychiatric: She has a normal mood and  affect. Her behavior is normal. Judgment and thought content normal.      BP 120/60  Pulse 68  Temp(Src) 98.4 F (36.9 C) (Oral)  Wt 147 lb (66.679 kg)     Assessment & Plan:  1. Chronic pain syndrome - Acetaminophen-Codeine (TYLENOL/CODEINE #3) 300-30 MG per tablet; Take 1 tablet by mouth every 4 (four) hours as needed.  Dispense: 60 tablet; Refill: 3  2. Leg cramps -Labs pending -Drink plenty of fluids - CMP14+EGFR - Vit D  25 hydroxy (rtn osteoporosis monitoring) - Magnesium  3. Follow-up exam -Breathing is much improved -Continue all current medications   [name], FNP

## 2013-10-07 NOTE — Patient Instructions (Signed)
Leg Cramps  Leg cramps that occur during exercise can be caused by poor circulation or dehydration. However, muscle cramps that occur at rest or during the night are usually not due to any serious medical problem. Heat cramps may cause muscle spasms during hot weather.   CAUSES  There is no clear cause for muscle cramps. However, dehydration may be a factor for those who do not drink enough fluids and those who exercise in the heat. Imbalances in the level of sodium, potassium, calcium or magnesium in the muscle tissue may also be a factor. Some medications, such as water pills (diuretics), may cause loss of chemicals that the body needs (like sodium and potassium) and cause muscle cramps.  TREATMENT   · Make sure your diet has enough fluids and essential minerals for the muscle to work normally.  · Avoid strenuous exercise for several days if you have been having frequent leg cramps.  · Stretch and massage the cramped muscle for several minutes.  · Some medicines may be helpful in some patients with night cramps. Only take over-the-counter or prescription medicines as directed by your caregiver.  SEEK IMMEDIATE MEDICAL CARE IF:   · Your leg cramps become worse.  · Your foot becomes cold, numb, or blue.  Document Released: 05/19/2004 Document Revised: 07/04/2011 Document Reviewed: 05/06/2008  Exit[employer]® Patient Information ©2014 [employer].

## 2013-10-08 ENCOUNTER — Other Ambulatory Visit: Payer: Self-pay | Admitting: Family

## 2013-10-08 DIAGNOSIS — E871 Hypo-osmolality and hyponatremia: Secondary | ICD-10-CM

## 2013-10-08 LAB — CMP14+EGFR
ALT: 19 IU/L (ref 0–32)
AST: 21 IU/L (ref 0–40)
Albumin/Globulin Ratio: 2.1 (ref 1.1–2.5)
Albumin: 4.4 g/dL (ref 3.5–4.7)
Alkaline Phosphatase: 60 IU/L (ref 39–117)
BUN/Creatinine Ratio: 28 — ABNORMAL HIGH (ref 11–26)
BUN: 32 mg/dL — ABNORMAL HIGH (ref 8–27)
CALCIUM: 9.3 mg/dL (ref 8.7–10.3)
CO2: 22 mmol/L (ref 18–29)
CREATININE: 1.15 mg/dL — AB (ref 0.57–1.00)
Chloride: 89 mmol/L — ABNORMAL LOW (ref 97–108)
GFR calc Af Amer: 52 mL/min/{1.73_m2} — ABNORMAL LOW (ref >59)
GFR calc non Af Amer: 45 mL/min/{1.73_m2} — ABNORMAL LOW (ref >59)
GLOBULIN, TOTAL: 2.1 g/dL (ref 1.5–4.5)
Glucose: 87 mg/dL (ref 65–99)
Potassium: 4.4 mmol/L (ref 3.5–5.2)
Sodium: 130 mmol/L — ABNORMAL LOW (ref 134–144)
Total Bilirubin: 0.3 mg/dL (ref 0.0–1.2)
Total Protein: 6.5 g/dL (ref 6.0–8.5)

## 2013-10-08 LAB — VITAMIN D 25 HYDROXY (VIT D DEFICIENCY, FRACTURES): Vit D, 25-Hydroxy: 30.2 ng/mL (ref 30.0–100.0)

## 2013-10-08 LAB — MAGNESIUM: MAGNESIUM: 2 mg/dL (ref 1.6–2.6)

## 2013-10-08 MED ORDER — SODIUM CHLORIDE 1 G PO TABS: 1.0000 g | ORAL_TABLET | Freq: Three times a day (TID) | ORAL | Status: DC

## 2013-10-18 ENCOUNTER — Encounter: Payer: Self-pay | Admitting: Cardiology

## 2013-10-18 ENCOUNTER — Ambulatory Visit (INDEPENDENT_AMBULATORY_CARE_PROVIDER_SITE_OTHER): Payer: Medicare Other | Admitting: Cardiology

## 2013-10-18 VITALS — BP 120/61 | HR 62 | Ht 61.0 in | Wt 142.0 lb

## 2013-10-18 DIAGNOSIS — I10 Essential (primary) hypertension: Secondary | ICD-10-CM

## 2013-10-18 DIAGNOSIS — Z951 Presence of aortocoronary bypass graft: Secondary | ICD-10-CM | POA: Diagnosis not present

## 2013-10-18 DIAGNOSIS — I2510 Atherosclerotic heart disease of native coronary artery without angina pectoris: Secondary | ICD-10-CM | POA: Diagnosis not present

## 2013-10-18 NOTE — Patient Instructions (Signed)
The current medical regimen is effective;  continue present plan and medications.  Follow up in 1 year with Dr Percival Spanish in the [place].  You will receive a letter in the mail 2 months before you are due.  Please call us when you receive this letter to schedule your follow up appointment.

## 2013-10-18 NOTE — Progress Notes (Signed)
HPI The patient presents for yearly followup of CAD.  Since I last saw her she says that she has "felt great."  She's not complaining of dyspnea and she had previously. She does have some leg cramping and has found to be hyponatremic. However, she reports that she drinks a lot of water to help her urinate. She was given sodium chloride pills but is having trouble taking these. She denies any chest pressure, neck or arm discomfort. She denies any acute shortness of breath, PND or orthopnea. She's had no palpitations, presyncope or syncope. She thinks his breathing better and she has lost a few pounds over about a year.  Allergies  Allergen Reactions  . Lisinopril   . Lunesta [Eszopiclone]   . Simvastatin   . Statins     lipitor crestor  . Ultram [Tramadol]     Current Outpatient Prescriptions  Medication Sig Dispense Refill  . Acetaminophen-Codeine 300-30 MG per tablet Take 2 tablets by mouth at bedtime as needed.      Marland Kitchen albuterol (PROVENTIL HFA;VENTOLIN HFA) 108 (90 BASE) MCG/ACT inhaler Inhale 2 puffs into the lungs every 6 (six) hours as needed for wheezing or shortness of breath.  1 Inhaler  6  . amLODipine (NORVASC) 10 MG tablet TAKE 1 TABLET EVERY DAY  90 tablet  1  . budesonide-formoterol (SYMBICORT) 160-4.5 MCG/ACT inhaler Inhale 2 puffs into the lungs 2 (two) times daily.  1 Inhaler  3  . Calcium Carbonate-Vitamin D (CALCIUM 600/VITAMIN D) 600-400 MG-UNIT per tablet Take 1 tablet by mouth 2 (two) times daily.        . citalopram (CELEXA) 20 MG tablet TAKE 1 TABLET (20 MG TOTAL) BY MOUTH DAILY.  90 tablet  0  . fluticasone (FLONASE) 50 MCG/ACT nasal spray PLACE 2 SPRAYS INTO THE NOSE DAILY.  48 g  1  . gemfibrozil (LOPID) 600 MG tablet Take 1 tablet (600 mg total) by mouth 2 (two) times daily before a meal.  60 tablet  3  . isosorbide mononitrate (IMDUR) 60 MG 24 hr tablet TAKE 1 TABLET (60 MG TOTAL) BY MOUTH DAILY.  90 tablet  1  . loratadine (CLARITIN) 10 MG tablet Take 10 mg by  mouth daily.        . Melatonin-Pyridoxine (MELATIN) 3-1 MG TABS Take 2 tablets by mouth at bedtime.        . metoprolol succinate (TOPROL-XL) 50 MG 24 hr tablet Take 1 tablet (50 mg total) by mouth daily.  90 tablet  2  . montelukast (SINGULAIR) 10 MG tablet Take 1 tablet (10 mg total) by mouth at bedtime.  30 tablet  6  . Multiple Vitamin (MULTIVITAMIN) tablet Take 1 tablet by mouth 2 (two) times daily.       . Omega-3 Fatty Acids (FISH OIL) 1000 MG CAPS Take 2 capsules by mouth 2 (two) times daily.        Marland Kitchen omeprazole (PRILOSEC) 20 MG capsule Take 1 capsule (20 mg total) by mouth 2 (two) times daily before a meal.  180 capsule  1  . potassium chloride (MICRO-K) 10 MEQ CR capsule TAKE 1 CAPSULE (10 MEQ TOTAL) BY MOUTH DAILY.  90 capsule  0  . promethazine (PHENERGAN) 25 MG tablet TAKE 2 TABLETS AT BEDTIME  60 tablet  2  . sodium chloride 1 G tablet Take 1 tablet (1 g total) by mouth 3 (three) times daily.  90 tablet  3  . triamterene-hydrochlorothiazide (MAXZIDE) 75-50 MG per tablet take one  daily  90 tablet  1  . vitamin C (ASCORBIC ACID) 500 MG tablet Take 1,000 mg by mouth 2 (two) times daily.         No current facility-administered medications for this visit.    Past Medical History  Diagnosis Date  . CAD (coronary artery disease)   . HTN (hypertension)   . HLD (hyperlipidemia)   . Skin cancer   . Carotid stenosis     40-59% bilateral    Past Surgical History  Procedure Laterality Date  . Coronary artery bypass graft  1996    LIMA to LAD, SVG to PDA and SVG to D1   . Coronary artery bypass graft  2010    cath 2 vessel CAD with 2 of 3 bypass grafts occluded. had a patent LIMA to LAD  . Abdominal hysterectomy  1974  . Baldder resuspension  1988  . Tonsillectomy    . Skin cancer resected      ROS:  As stated in the HPI and negative for all other systems.  PHYSICAL EXAM BP 120/61  Pulse 62  Ht 5\' 1"  (1.549 m)  Wt 142 lb (64.411 kg)  BMI 26.84 kg/m2 GENERAL:  Well  appearing NECK:  No jugular venous distention, waveform within normal limits, carotid upstroke brisk and symmetric, right carotid and subclavian bruits, no thyromegaly LUNGS:  Clear  BACK:  No CVA tenderness CHEST:  Well healed sternotomy scar. HEART:  PMI not displaced or sustained,S1 and S2 within normal limits, no S3, no S4, no clicks, no rubs, no murmurs ABD:  Flat, positive bowel sounds normal in frequency in pitch, no bruits, no rebound, no guarding, no midline pulsatile mass, no hepatomegaly, no splenomegaly EXT:  2 plus pulses throughout, no edema, no cyanosis no clubbing  EKG:  Sinus rhythm, rate 59, axis within normal limits, intervals within normal limits, no acute ST-T wave changes.  10/18/2013  ASSESSMENT AND PLAN  CAD:  I do not think her symptoms are an anginal equivalent. She does not want further workup at this point. For now she will continue the meds as listed.  DYSPNEA:  She reports that this is at baseline.  No change in therapy is indicated.   CAROTID STENOSIS:  This was 40-59% left and less than this on the right.  She will have this followed again in Feb of next year.  HTN:  The blood pressure is at target. No change in medications is indicated. We will continue with therapeutic lifestyle changes (TLC).

## 2013-11-08 ENCOUNTER — Encounter: Payer: Self-pay | Admitting: *Deleted

## 2013-11-18 ENCOUNTER — Other Ambulatory Visit: Payer: Self-pay

## 2013-11-18 MED ORDER — ISOSORBIDE MONONITRATE ER 60 MG PO TB24: ORAL_TABLET | ORAL | Status: DC

## 2013-11-20 ENCOUNTER — Other Ambulatory Visit: Payer: Self-pay | Admitting: Family Medicine

## 2013-11-20 ENCOUNTER — Other Ambulatory Visit: Payer: Self-pay | Admitting: Family

## 2013-11-22 ENCOUNTER — Other Ambulatory Visit: Payer: Self-pay | Admitting: *Deleted

## 2013-11-22 MED ORDER — OMEPRAZOLE 20 MG PO CPDR: 20.0000 mg | DELAYED_RELEASE_CAPSULE | Freq: Two times a day (BID) | ORAL | Status: DC

## 2013-11-22 MED ORDER — POTASSIUM CHLORIDE ER 10 MEQ PO CPCR: ORAL_CAPSULE | ORAL | Status: DC

## 2013-11-22 MED ORDER — AMLODIPINE BESYLATE 10 MG PO TABS: ORAL_TABLET | ORAL | Status: DC

## 2013-11-22 MED ORDER — TRIAMTERENE-HCTZ 75-50 MG PO TABS: ORAL_TABLET | ORAL | Status: DC

## 2013-12-26 ENCOUNTER — Other Ambulatory Visit: Payer: Self-pay | Admitting: Nurse Practitioner

## 2014-01-08 ENCOUNTER — Ambulatory Visit (INDEPENDENT_AMBULATORY_CARE_PROVIDER_SITE_OTHER): Payer: Medicare Other | Admitting: Family

## 2014-01-08 ENCOUNTER — Encounter: Payer: Self-pay | Admitting: Family

## 2014-01-08 VITALS — BP 113/48 | HR 58 | Temp 97.8°F | Ht 61.0 in | Wt 142.8 lb

## 2014-01-08 DIAGNOSIS — E785 Hyperlipidemia, unspecified: Secondary | ICD-10-CM

## 2014-01-08 DIAGNOSIS — F329 Major depressive disorder, single episode, unspecified: Secondary | ICD-10-CM | POA: Diagnosis not present

## 2014-01-08 DIAGNOSIS — I10 Essential (primary) hypertension: Secondary | ICD-10-CM

## 2014-01-08 DIAGNOSIS — G4700 Insomnia, unspecified: Secondary | ICD-10-CM | POA: Diagnosis not present

## 2014-01-08 DIAGNOSIS — E559 Vitamin D deficiency, unspecified: Secondary | ICD-10-CM | POA: Diagnosis not present

## 2014-01-08 DIAGNOSIS — I2510 Atherosclerotic heart disease of native coronary artery without angina pectoris: Secondary | ICD-10-CM

## 2014-01-08 DIAGNOSIS — K219 Gastro-esophageal reflux disease without esophagitis: Secondary | ICD-10-CM | POA: Insufficient documentation

## 2014-01-08 DIAGNOSIS — M25519 Pain in unspecified shoulder: Secondary | ICD-10-CM

## 2014-01-08 DIAGNOSIS — J45909 Unspecified asthma, uncomplicated: Secondary | ICD-10-CM | POA: Diagnosis not present

## 2014-01-08 DIAGNOSIS — J4520 Mild intermittent asthma, uncomplicated: Secondary | ICD-10-CM

## 2014-01-08 DIAGNOSIS — M25512 Pain in left shoulder: Secondary | ICD-10-CM

## 2014-01-08 DIAGNOSIS — Z1321 Encounter for screening for nutritional disorder: Secondary | ICD-10-CM

## 2014-01-08 DIAGNOSIS — F32A Depression, unspecified: Secondary | ICD-10-CM

## 2014-01-08 DIAGNOSIS — F3289 Other specified depressive episodes: Secondary | ICD-10-CM | POA: Diagnosis not present

## 2014-01-08 MED ORDER — ACETAMINOPHEN-CODEINE 300-30 MG PO TABS: 2.0000 | ORAL_TABLET | Freq: Every evening | ORAL | Status: DC | PRN

## 2014-01-08 MED ORDER — PROMETHAZINE HCL 25 MG PO TABS: ORAL_TABLET | ORAL | Status: DC

## 2014-01-08 NOTE — Patient Instructions (Signed)

## 2014-01-08 NOTE — Progress Notes (Signed)
Subjective:    Patient ID: [name], female    DOB: 1930-10-04, 80 y.o.   MRN: [id number]  Hyperlipidemia This is a chronic problem. The current episode started more than 1 year ago. The problem is uncontrolled. Recent lipid tests were reviewed and are high. She has no history of diabetes or hypothyroidism. Factors aggravating her hyperlipidemia include fatty foods. Pertinent negatives include no chest pain, leg pain, myalgias or shortness of breath. Current antihyperlipidemic treatment includes herbal therapy. The current treatment provides no improvement of lipids. Compliance problems include adherence to diet.  Risk factors for coronary artery disease include dyslipidemia, family history, hypertension and post-menopausal.  Hypertension This is a chronic problem. The current episode started more than 1 year ago. The problem has been resolved since onset. The problem is controlled. Pertinent negatives include no anxiety, chest pain, headaches, palpitations, peripheral edema or shortness of breath. Risk factors for coronary artery disease include dyslipidemia, family history, obesity and post-menopausal state. Past treatments include beta blockers and calcium channel blockers. The current treatment provides moderate improvement. Hypertensive end-organ damage includes CAD/MI. There is no history of kidney disease, CVA, heart failure or a thyroid problem. There is no history of sleep apnea.  Asthma She complains of cough and sputum production. There is no difficulty breathing, shortness of breath or wheezing. This is a chronic problem. The current episode started more than 1 year ago. The problem occurs intermittently. The problem has been waxing and waning. The cough is productive of sputum. Pertinent negatives include no chest pain, dyspnea on exertion, headaches, heartburn, myalgias or sore throat. Her symptoms are aggravated by change in weather. Her symptoms are alleviated by rest. She reports  significant improvement on treatment. Her past medical history is significant for asthma. There is no history of COPD.  Gastrophageal Reflux She complains of coughing. She reports no chest pain, no heartburn, no sore throat or no wheezing. This is a chronic problem. The current episode started more than 1 year ago. The problem occurs rarely. The problem has been resolved. The symptoms are aggravated by certain foods. Pertinent negatives include no fatigue. She has tried a PPI for the symptoms. The treatment provided significant relief.      Review of Systems  Constitutional: Negative.  Negative for fatigue.  HENT: Negative.  Negative for sore throat.   Eyes: Negative.   Respiratory: Positive for cough and sputum production. Negative for shortness of breath and wheezing.   Cardiovascular: Negative.  Negative for chest pain, dyspnea on exertion and palpitations.  Gastrointestinal: Negative.  Negative for heartburn.  Endocrine: Negative.   Genitourinary: Negative.   Musculoskeletal: Negative.  Negative for myalgias.  Neurological: Negative.  Negative for headaches.  Hematological: Negative.   Psychiatric/Behavioral: Negative.   All other systems reviewed and are negative.      Objective:   Physical Exam  Vitals reviewed. Constitutional: She is oriented to person, place, and time. She appears well-developed and well-nourished. No distress.  HENT:  Head: Normocephalic and atraumatic.  Right Ear: External ear normal.  Mouth/Throat: Oropharynx is clear and moist.  Eyes: Pupils are equal, round, and reactive to light.  Neck: Normal range of motion. Neck supple. No thyromegaly present.  Cardiovascular: Normal rate, regular rhythm, normal heart sounds and intact distal pulses.   No murmur heard. Pulmonary/Chest: Effort normal and breath sounds normal. No respiratory distress. She has no wheezes.  Abdominal: Soft. Bowel sounds are normal. She exhibits no distension. There is no tenderness.    Musculoskeletal:  Normal range of motion. She exhibits no edema and no tenderness.  Neurological: She is alert and oriented to person, place, and time. She has normal reflexes. No cranial nerve deficit.  Skin: Skin is warm and dry.  Psychiatric: She has a normal mood and affect. Her behavior is normal. Judgment and thought content normal.    BP 113/48  Pulse 58  Temp(Src) 97.8 F (36.6 C) (Oral)  Ht 5' 1"  (1.549 m)  Wt 142 lb 12.8 oz (64.774 kg)  BMI 27.00 kg/m2       Assessment & Plan:  1. CAD - CMP14+EGFR  2. Essential hypertension, benign - CMP14+EGFR  3. HYPERLIPIDEMIA - CMP14+EGFR  4. Insomnia - CMP14+EGFR - promethazine (PHENERGAN) 25 MG tablet; TAKE 2 TABLETS AT BEDTIME  Dispense: 60 tablet; Refill: 2  5. Depression - CMP14+EGFR  6. Asthma, chronic, mild intermittent, uncomplicated - KJZ79+XTAV  7. Gastroesophageal reflux disease without esophagitis  - CMP14+EGFR  8. Encounter for vitamin deficiency screening  - Vit D  25 hydroxy (rtn osteoporosis monitoring)  9. Shoulder pain, left  - Acetaminophen-Codeine 300-30 MG per tablet; Take 2 tablets by mouth at bedtime as needed.  Dispense: 30 tablet; Refill: 3   Continue all meds Labs pending Health Maintenance reviewed Diet and exercise encouraged RTO 3 month  Shelly Dun, FNP

## 2014-01-09 LAB — CMP14+EGFR
ALBUMIN: 4.2 g/dL (ref 3.5–4.7)
ALK PHOS: 55 IU/L (ref 39–117)
ALT: 20 IU/L (ref 0–32)
AST: 17 IU/L (ref 0–40)
Albumin/Globulin Ratio: 1.8 (ref 1.1–2.5)
BUN / CREAT RATIO: 28 — AB (ref 11–26)
BUN: 33 mg/dL — AB (ref 8–27)
CO2: 24 mmol/L (ref 18–29)
Calcium: 9.2 mg/dL (ref 8.7–10.3)
Chloride: 97 mmol/L (ref 97–108)
Creatinine, Ser: 1.19 mg/dL — ABNORMAL HIGH (ref 0.57–1.00)
GFR calc non Af Amer: 43 mL/min/{1.73_m2} — ABNORMAL LOW (ref >59)
GFR, EST AFRICAN AMERICAN: 49 mL/min/{1.73_m2} — AB (ref >59)
GLOBULIN, TOTAL: 2.3 g/dL (ref 1.5–4.5)
Glucose: 104 mg/dL — ABNORMAL HIGH (ref 65–99)
Potassium: 4.4 mmol/L (ref 3.5–5.2)
Sodium: 136 mmol/L (ref 134–144)
Total Bilirubin: 0.3 mg/dL (ref 0.0–1.2)
Total Protein: 6.5 g/dL (ref 6.0–8.5)

## 2014-01-09 LAB — VITAMIN D 25 HYDROXY (VIT D DEFICIENCY, FRACTURES): Vit D, 25-Hydroxy: 30.9 ng/mL (ref 30.0–100.0)

## 2014-01-11 ENCOUNTER — Other Ambulatory Visit: Payer: Self-pay | Admitting: Family

## 2014-02-17 ENCOUNTER — Other Ambulatory Visit: Payer: Self-pay | Admitting: Family

## 2014-02-18 NOTE — Telephone Encounter (Signed)
Last seen 01/08/14 [name]  Requesting 90 day supply

## 2014-03-07 ENCOUNTER — Telehealth: Payer: Self-pay | Admitting: Family

## 2014-03-10 ENCOUNTER — Other Ambulatory Visit: Payer: Self-pay | Admitting: Nurse Practitioner

## 2014-03-10 DIAGNOSIS — M25512 Pain in left shoulder: Secondary | ICD-10-CM

## 2014-03-10 MED ORDER — ACETAMINOPHEN-CODEINE 300-30 MG PO TABS: 2.0000 | ORAL_TABLET | Freq: Every evening | ORAL | Status: DC | PRN

## 2014-03-10 NOTE — Telephone Encounter (Signed)
Tylenol with codeine rx ready for pick up

## 2014-03-10 NOTE — Telephone Encounter (Signed)
Script ready.

## 2014-03-10 NOTE — Telephone Encounter (Signed)
I'm confused

## 2014-03-24 DIAGNOSIS — Z23 Encounter for immunization: Secondary | ICD-10-CM | POA: Diagnosis not present

## 2014-03-26 ENCOUNTER — Other Ambulatory Visit: Payer: Self-pay | Admitting: Nurse Practitioner

## 2014-04-01 ENCOUNTER — Other Ambulatory Visit: Payer: Self-pay | Admitting: Family

## 2014-04-11 ENCOUNTER — Other Ambulatory Visit: Payer: Self-pay | Admitting: *Deleted

## 2014-04-11 MED ORDER — FLUTICASONE PROPIONATE 50 MCG/ACT NA SUSP: NASAL | Status: DC

## 2014-05-09 ENCOUNTER — Ambulatory Visit (INDEPENDENT_AMBULATORY_CARE_PROVIDER_SITE_OTHER): Payer: Medicare Other | Admitting: Family

## 2014-05-09 ENCOUNTER — Encounter: Payer: Self-pay | Admitting: Family

## 2014-05-09 VITALS — BP 133/59 | HR 63 | Temp 97.1°F | Ht 61.0 in | Wt 145.8 lb

## 2014-05-09 DIAGNOSIS — E785 Hyperlipidemia, unspecified: Secondary | ICD-10-CM | POA: Diagnosis not present

## 2014-05-09 DIAGNOSIS — Z951 Presence of aortocoronary bypass graft: Secondary | ICD-10-CM

## 2014-05-09 DIAGNOSIS — F329 Major depressive disorder, single episode, unspecified: Secondary | ICD-10-CM

## 2014-05-09 DIAGNOSIS — J45909 Unspecified asthma, uncomplicated: Secondary | ICD-10-CM | POA: Diagnosis not present

## 2014-05-09 DIAGNOSIS — G894 Chronic pain syndrome: Secondary | ICD-10-CM | POA: Diagnosis not present

## 2014-05-09 DIAGNOSIS — E559 Vitamin D deficiency, unspecified: Secondary | ICD-10-CM

## 2014-05-09 DIAGNOSIS — J4520 Mild intermittent asthma, uncomplicated: Secondary | ICD-10-CM | POA: Diagnosis not present

## 2014-05-09 DIAGNOSIS — K219 Gastro-esophageal reflux disease without esophagitis: Secondary | ICD-10-CM

## 2014-05-09 DIAGNOSIS — M25512 Pain in left shoulder: Secondary | ICD-10-CM | POA: Diagnosis not present

## 2014-05-09 DIAGNOSIS — I10 Essential (primary) hypertension: Secondary | ICD-10-CM | POA: Diagnosis not present

## 2014-05-09 DIAGNOSIS — F32A Depression, unspecified: Secondary | ICD-10-CM

## 2014-05-09 DIAGNOSIS — M25519 Pain in unspecified shoulder: Secondary | ICD-10-CM | POA: Diagnosis not present

## 2014-05-09 MED ORDER — ACETAMINOPHEN-CODEINE 300-30 MG PO TABS: 2.0000 | ORAL_TABLET | Freq: Every evening | ORAL | Status: DC | PRN

## 2014-05-09 MED ORDER — BUDESONIDE-FORMOTEROL FUMARATE 160-4.5 MCG/ACT IN AERO: INHALATION_SPRAY | RESPIRATORY_TRACT | Status: DC

## 2014-05-09 NOTE — Progress Notes (Signed)
Subjective:    Patient ID: [name], female    DOB: 12-10-1930, 81 y.o.   MRN: [id number]  Hypertension This is a chronic problem. The current episode started more than 1 year ago. The problem has been resolved since onset. The problem is controlled. Pertinent negatives include no anxiety, chest pain, headaches, palpitations, peripheral edema or shortness of breath. Risk factors for coronary artery disease include dyslipidemia, family history, obesity and post-menopausal state. Past treatments include beta blockers and calcium channel blockers. The current treatment provides moderate improvement. Hypertensive end-organ damage includes CAD/MI. There is no history of kidney disease, CVA, heart failure or a thyroid problem. There is no history of sleep apnea.  Hyperlipidemia This is a chronic problem. The current episode started more than 1 year ago. The problem is uncontrolled. Recent lipid tests were reviewed and are high. She has no history of diabetes or hypothyroidism. Factors aggravating her hyperlipidemia include fatty foods. Pertinent negatives include no chest pain, leg pain, myalgias or shortness of breath. Current antihyperlipidemic treatment includes herbal therapy. The current treatment provides no improvement of lipids. Compliance problems include adherence to diet.  Risk factors for coronary artery disease include dyslipidemia, family history, hypertension and post-menopausal.  Asthma She complains of cough, difficulty breathing, hoarse voice and wheezing. There is no shortness of breath. Primary symptoms comments: Pt has not been taking Symbicort for last month and half because insurance would not cover it . This is a chronic problem. The problem occurs constantly. The problem has been waxing and waning. The cough is hoarse and productive of sputum. Pertinent negatives include no chest pain, headaches, myalgias or sore throat. Her symptoms are aggravated by exercise and lying down.  Her symptoms are alleviated by rest. Her past medical history is significant for asthma.  Gastrophageal Reflux She complains of coughing, a hoarse voice and wheezing. She reports no chest pain or no sore throat. This is a chronic problem. The current episode started more than 1 year ago. The problem occurs rarely. The problem has been unchanged. The symptoms are aggravated by lying down. Pertinent negatives include no fatigue. She has tried a PPI for the symptoms. The treatment provided significant relief.      Review of Systems  Constitutional: Negative.  Negative for fatigue.  HENT: Positive for hoarse voice. Negative for sore throat.   Eyes: Negative.   Respiratory: Positive for cough and wheezing. Negative for shortness of breath.   Cardiovascular: Negative.  Negative for chest pain and palpitations.  Gastrointestinal: Negative.   Endocrine: Negative.   Genitourinary: Negative.   Musculoskeletal: Negative.  Negative for myalgias.  Neurological: Negative.  Negative for headaches.  Hematological: Negative.   Psychiatric/Behavioral: Negative.   All other systems reviewed and are negative.      Objective:   Physical Exam  Constitutional: She is oriented to person, place, and time. She appears well-developed and well-nourished. No distress.  HENT:  Head: Normocephalic and atraumatic.  Right Ear: External ear normal.  Left Ear: External ear normal.  Nose: Nose normal.  Mouth/Throat: Oropharynx is clear and moist.  Eyes: Pupils are equal, round, and reactive to light.  Neck: Normal range of motion. Neck supple. No thyromegaly present.  Cardiovascular: Normal rate, regular rhythm, normal heart sounds and intact distal pulses.   No murmur heard. Pulmonary/Chest: Effort normal. No respiratory distress. She has wheezes.  Abdominal: Soft. Bowel sounds are normal. She exhibits no distension. There is no tenderness.  Musculoskeletal: Normal range of motion. She exhibits no  edema or  tenderness.  Neurological: She is alert and oriented to person, place, and time. She has normal reflexes. No cranial nerve deficit.  Skin: Skin is warm and dry.  Psychiatric: She has a normal mood and affect. Her behavior is normal. Judgment and thought content normal.  Vitals reviewed.      BP 133/59 mmHg  Pulse 63  Temp(Src) 97.1 F (36.2 C) (Oral)  Ht 5' 1"  (1.549 m)  Wt 145 lb 12.8 oz (66.134 kg)  BMI 27.56 kg/m2  Assessment & Plan:  1. Shoulder pain, left - CMP14+EGFR - Acetaminophen-Codeine 300-30 MG per tablet; Take 2 tablets by mouth at bedtime as needed.  Dispense: 60 tablet; Refill: 3  2. Essential hypertension, benign - CMP14+EGFR  3. CORONARY ARTERY BYPASS GRAFT, THREE VESSEL, HX OF - CMP14+EGFR  4. Asthma, chronic, mild intermittent, uncomplicated -Pt's symbicort reordered- Pt had gone one month and half without and has a lot of wheezing -If pt becomes in donut hole again- Pt to call and get sample of medication - CMP14+EGFR - budesonide-formoterol (SYMBICORT) 160-4.5 MCG/ACT inhaler; INHALE 2 PUFFS INTO THE LUNGS 2 (TWO) TIMES DAILY.  Dispense: 10.2 g; Refill: 11  5. Gastroesophageal reflux disease without esophagitis - CMP14+EGFR  6. Hyperlipidemia -Pt always has "high cholesterol" and does not want to be on any medication for this- We will not check lipid level at this time  CMP14+EGFR  7. Depression - CMP14+EGFR  8. Chronic pain syndrome - CMP14+EGFR  9. Vitamin D deficiency - Vit D  25 hydroxy (rtn osteoporosis monitoring)   Continue all meds Labs pending Health Maintenance reviewed Diet and exercise encouraged RTO 3 months  [name], FNP

## 2014-05-09 NOTE — Patient Instructions (Signed)

## 2014-05-10 LAB — CMP14+EGFR
ALK PHOS: 61 IU/L (ref 39–117)
ALT: 22 IU/L (ref 0–32)
AST: 20 IU/L (ref 0–40)
Albumin/Globulin Ratio: 2 (ref 1.1–2.5)
Albumin: 4.4 g/dL (ref 3.5–4.7)
BUN/Creatinine Ratio: 24 (ref 11–26)
BUN: 32 mg/dL — AB (ref 8–27)
CHLORIDE: 95 mmol/L — AB (ref 97–108)
CO2: 22 mmol/L (ref 18–29)
Calcium: 9.4 mg/dL (ref 8.7–10.3)
Creatinine, Ser: 1.31 mg/dL — ABNORMAL HIGH (ref 0.57–1.00)
GFR calc Af Amer: 44 mL/min/{1.73_m2} — ABNORMAL LOW (ref >59)
GFR calc non Af Amer: 38 mL/min/{1.73_m2} — ABNORMAL LOW (ref >59)
GLOBULIN, TOTAL: 2.2 g/dL (ref 1.5–4.5)
Glucose: 95 mg/dL (ref 65–99)
POTASSIUM: 4.5 mmol/L (ref 3.5–5.2)
Sodium: 135 mmol/L (ref 134–144)
Total Bilirubin: 0.2 mg/dL (ref 0.0–1.2)
Total Protein: 6.6 g/dL (ref 6.0–8.5)

## 2014-05-10 LAB — VITAMIN D 25 HYDROXY (VIT D DEFICIENCY, FRACTURES): Vit D, 25-Hydroxy: 38.7 ng/mL (ref 30.0–100.0)

## 2014-05-14 ENCOUNTER — Other Ambulatory Visit: Payer: Self-pay | Admitting: Family

## 2014-05-15 ENCOUNTER — Other Ambulatory Visit: Payer: Self-pay | Admitting: Family

## 2014-05-15 ENCOUNTER — Other Ambulatory Visit: Payer: Self-pay | Admitting: Nurse Practitioner

## 2014-05-17 ENCOUNTER — Other Ambulatory Visit: Payer: Self-pay | Admitting: Family

## 2014-05-21 ENCOUNTER — Other Ambulatory Visit: Payer: Self-pay | Admitting: Radiology

## 2014-05-21 DIAGNOSIS — I6523 Occlusion and stenosis of bilateral carotid arteries: Secondary | ICD-10-CM

## 2014-06-06 ENCOUNTER — Ambulatory Visit (HOSPITAL_COMMUNITY): Payer: Medicare Other | Attending: Family | Admitting: Cardiology

## 2014-06-06 DIAGNOSIS — I6523 Occlusion and stenosis of bilateral carotid arteries: Secondary | ICD-10-CM | POA: Diagnosis not present

## 2014-06-06 NOTE — Progress Notes (Signed)
Carotid duplex performed 

## 2014-06-23 ENCOUNTER — Other Ambulatory Visit: Payer: Self-pay | Admitting: Family

## 2014-07-02 ENCOUNTER — Other Ambulatory Visit: Payer: Self-pay | Admitting: Family

## 2014-07-15 ENCOUNTER — Other Ambulatory Visit: Payer: Self-pay | Admitting: Family

## 2014-07-15 ENCOUNTER — Other Ambulatory Visit: Payer: Self-pay | Admitting: Family Medicine

## 2014-07-22 ENCOUNTER — Other Ambulatory Visit: Payer: Self-pay | Admitting: Family

## 2014-08-12 ENCOUNTER — Encounter: Payer: Self-pay | Admitting: Family

## 2014-08-12 ENCOUNTER — Ambulatory Visit (INDEPENDENT_AMBULATORY_CARE_PROVIDER_SITE_OTHER): Payer: Medicare Other | Admitting: Family

## 2014-08-12 VITALS — BP 124/50 | HR 62 | Temp 97.5°F | Ht 61.0 in | Wt 146.8 lb

## 2014-08-12 DIAGNOSIS — M25519 Pain in unspecified shoulder: Secondary | ICD-10-CM | POA: Diagnosis not present

## 2014-08-12 DIAGNOSIS — F329 Major depressive disorder, single episode, unspecified: Secondary | ICD-10-CM | POA: Diagnosis not present

## 2014-08-12 DIAGNOSIS — I6523 Occlusion and stenosis of bilateral carotid arteries: Secondary | ICD-10-CM | POA: Diagnosis not present

## 2014-08-12 DIAGNOSIS — R591 Generalized enlarged lymph nodes: Secondary | ICD-10-CM

## 2014-08-12 DIAGNOSIS — I25119 Atherosclerotic heart disease of native coronary artery with unspecified angina pectoris: Secondary | ICD-10-CM

## 2014-08-12 DIAGNOSIS — I209 Angina pectoris, unspecified: Secondary | ICD-10-CM | POA: Diagnosis not present

## 2014-08-12 DIAGNOSIS — E785 Hyperlipidemia, unspecified: Secondary | ICD-10-CM | POA: Diagnosis not present

## 2014-08-12 DIAGNOSIS — F32A Depression, unspecified: Secondary | ICD-10-CM

## 2014-08-12 DIAGNOSIS — J4520 Mild intermittent asthma, uncomplicated: Secondary | ICD-10-CM | POA: Diagnosis not present

## 2014-08-12 DIAGNOSIS — M25512 Pain in left shoulder: Secondary | ICD-10-CM | POA: Diagnosis not present

## 2014-08-12 DIAGNOSIS — J45909 Unspecified asthma, uncomplicated: Secondary | ICD-10-CM | POA: Diagnosis not present

## 2014-08-12 DIAGNOSIS — K219 Gastro-esophageal reflux disease without esophagitis: Secondary | ICD-10-CM | POA: Diagnosis not present

## 2014-08-12 DIAGNOSIS — I2510 Atherosclerotic heart disease of native coronary artery without angina pectoris: Secondary | ICD-10-CM | POA: Diagnosis not present

## 2014-08-12 DIAGNOSIS — R599 Enlarged lymph nodes, unspecified: Secondary | ICD-10-CM

## 2014-08-12 DIAGNOSIS — G4700 Insomnia, unspecified: Secondary | ICD-10-CM | POA: Diagnosis not present

## 2014-08-12 DIAGNOSIS — I10 Essential (primary) hypertension: Secondary | ICD-10-CM | POA: Diagnosis not present

## 2014-08-12 MED ORDER — ACETAMINOPHEN-CODEINE 300-30 MG PO TABS
2.0000 | ORAL_TABLET | Freq: Every evening | ORAL | Status: DC | PRN
Start: 2014-08-12 — End: 2014-12-08

## 2014-08-12 NOTE — Progress Notes (Signed)
Subjective:    Patient ID: [name], female    DOB: March 23, 1931, 81 y.o.   MRN: [id number]  Pt presents to the office today for chronic follow up. Pt reports losing 10 lbs and states she is feeling much better since then. States her breathing has improved greatly. Pt states she noticed about a month ago a "lump" under her right axillary. PT denies any pain or tenderness in that area.   Hyperlipidemia This is a chronic problem. The current episode started more than 1 year ago. The problem is uncontrolled. Recent lipid tests were reviewed and are high. Exacerbating diseases include diabetes. She has no history of hypothyroidism. Factors aggravating her hyperlipidemia include fatty foods. Pertinent negatives include no chest pain, leg pain, myalgias or shortness of breath. Current antihyperlipidemic treatment includes diet change. The current treatment provides no improvement of lipids. Risk factors for coronary artery disease include diabetes mellitus, family history, dyslipidemia, hypertension, obesity and post-menopausal.  Hypertension This is a chronic problem. The current episode started more than 1 year ago. The problem has been resolved since onset. The problem is controlled. Pertinent negatives include no chest pain, headaches, malaise/fatigue, palpitations, peripheral edema or shortness of breath. Past treatments include calcium channel blockers and beta blockers. The current treatment provides moderate improvement. Hypertensive end-organ damage includes CAD/MI and heart failure. There is no history of kidney disease, CVA or a thyroid problem. There is no history of sleep apnea.  Asthma She complains of cough. There is no hemoptysis, hoarse voice or shortness of breath. This is a chronic problem. The current episode started more than 1 year ago. The problem occurs intermittently. The problem has been waxing and waning. Pertinent negatives include no chest pain, headaches, malaise/fatigue,  myalgias or sore throat. Her symptoms are alleviated by leukotriene antagonist, rest and ipratropium. She reports moderate improvement on treatment. Her past medical history is significant for asthma.  Gastrophageal Reflux She complains of coughing. She reports no chest pain, no globus sensation, no hoarse voice, no sore throat or no water brash. This is a chronic problem. The current episode started more than 1 year ago. The problem occurs rarely. The problem has been resolved. Pertinent negatives include no fatigue. She has tried a PPI for the symptoms. The treatment provided significant relief.      Review of Systems  Constitutional: Negative.  Negative for malaise/fatigue and fatigue.  HENT: Negative.  Negative for hoarse voice and sore throat.   Eyes: Negative.   Respiratory: Positive for cough. Negative for hemoptysis and shortness of breath.   Cardiovascular: Negative.  Negative for chest pain and palpitations.  Gastrointestinal: Negative.   Endocrine: Negative.   Genitourinary: Negative.   Musculoskeletal: Negative.  Negative for myalgias.  Neurological: Negative.  Negative for headaches.  Hematological: Negative.   Psychiatric/Behavioral: Negative.   All other systems reviewed and are negative.      Objective:   Physical Exam  Constitutional: She is oriented to person, place, and time. She appears well-developed and well-nourished. No distress.  HENT:  Head: Normocephalic and atraumatic.  Right Ear: External ear normal.  Left Ear: External ear normal.  Nose: Nose normal.  Mouth/Throat: Oropharynx is clear and moist.  Eyes: Pupils are equal, round, and reactive to light.  Neck: Normal range of motion. Neck supple. No thyromegaly present.  Cardiovascular: Normal rate, regular rhythm, normal heart sounds and intact distal pulses.   No murmur heard. Pulmonary/Chest: Effort normal and breath sounds normal. No respiratory distress. She has no  wheezes.  Abdominal: Soft. Bowel  sounds are normal. She exhibits no distension. There is no tenderness.  Musculoskeletal: Normal range of motion. She exhibits no edema or tenderness.  Neurological: She is alert and oriented to person, place, and time. She has normal reflexes. No cranial nerve deficit.  Skin: Skin is warm and dry.  Small moveable mass on left shoulder   Psychiatric: She has a normal mood and affect. Her behavior is normal. Judgment and thought content normal.  Vitals reviewed.   BP 124/50 mmHg  Pulse 62  Temp(Src) 97.5 F (36.4 C) (Oral)  Ht _0  (1.549 m)  Wt 146 lb 12.8 oz (66.588 kg)  BMI 27.75 kg/m2       Assessment & Plan:  1. Essential hypertension, benign - CMP14+EGFR  2. Atherosclerosis of native coronary artery of native heart with angina pectoris - CMP14+EGFR  3. Asthma, chronic, mild intermittent, uncomplicated - PPI95+JOAC  4. Gastroesophageal reflux disease without esophagitis - CMP14+EGFR  5. Hyperlipidemia -Pt does not want to be on statin and does not want a lipid panel drawn today  CMP14+EGFR  6. Depression - CMP14+EGFR  7. Insomnia - CMP14+EGFR  8. Shoulder pain, left - CMP14+EGFR - Acetaminophen-Codeine 300-30 MG per tablet; Take 2 tablets by mouth at bedtime as needed.  Dispense: 60 tablet; Refill: 3  9. Enlarged lymph node Pt to continue to monitor and report any changes or if she notices any other swollen lymph nodes in any other area   Continue all meds Labs pending Health Maintenance reviewed- Pt to continue with losing weight Diet and exercise encouraged RTO 6 months  [name], FNP

## 2014-08-12 NOTE — Patient Instructions (Signed)
Swollen Lymph Nodes The lymphatic system filters fluid from around cells. It is like a system of blood vessels. These channels carry lymph instead of blood. The lymphatic system is an important part of the immune (disease fighting) system. When people talk about "swollen glands in the neck," they are usually talking about swollen lymph nodes. The lymph nodes are like the little traps for infection. You and your caregiver may be able to feel lymph nodes, especially swollen nodes, in these common areas: the groin (inguinal area), armpits (axilla), and above the clavicle (supraclavicular). You may also feel them in the neck (cervical) and the back of the head just above the hairline (occipital). Swollen glands occur when there is any condition in which the body responds with an allergic type of reaction. For instance, the glands in the neck can become swollen from insect bites or any type of minor infection on the head. These are very noticeable in children with only minor problems. Lymph nodes may also become swollen when there is a tumor or problem with the lymphatic system, such as Hodgkin's disease. TREATMENT   Most swollen glands do not require treatment. They can be observed (watched) for a short period of time, if your caregiver feels it is necessary. Most of the time, observation is not necessary.  Antibiotics (medicines that kill germs) may be prescribed by your caregiver. Your caregiver may prescribe these if he or she feels the swollen glands are due to a bacterial (germ) infection. Antibiotics are not used if the swollen glands are caused by a virus. HOME CARE INSTRUCTIONS   Take medications as directed by your caregiver. Only take over-the-counter or prescription medicines for pain, discomfort, or fever as directed by your caregiver. SEEK MEDICAL CARE IF:   If you begin to run a temperature greater than 102 F (38.9 C), or as your caregiver suggests. MAKE SURE YOU:   Understand these  instructions.  Will watch your condition.  Will get help right away if you are not doing well or get worse. Document Released: 04/01/2002 Document Revised: 07/04/2011 Document Reviewed: 04/11/2005 [facility] Patient Information 2015 [place], [place]. This information is not intended to replace advice given to you by your health care provider. Make sure you discuss any questions you have with your health care provider. Asthma Asthma is a recurring condition in which the airways tighten and narrow. Asthma can make it difficult to breathe. It can cause coughing, wheezing, and shortness of breath. Asthma episodes, also called asthma attacks, range from minor to life-threatening. Asthma cannot be cured, but medicines and lifestyle changes can help control it. CAUSES Asthma is believed to be caused by inherited (genetic) and environmental factors, but its exact cause is unknown. Asthma may be triggered by allergens, lung infections, or irritants in the air. Asthma triggers are different for each person. Common triggers include:   Animal dander.  Dust mites.  Cockroaches.  Pollen from trees or grass.  Mold.  Smoke.  Air pollutants such as dust, household cleaners, hair sprays, aerosol sprays, paint fumes, strong chemicals, or strong odors.  Cold air, weather changes, and winds (which increase molds and pollens in the air).  Strong emotional expressions such as crying or laughing hard.  Stress.  Certain medicines (such as aspirin) or types of drugs (such as beta-blockers).  Sulfites in foods and drinks. Foods and drinks that may contain sulfites include dried fruit, potato chips, and sparkling grape juice.  Infections or inflammatory conditions such as the flu, a cold, or  an inflammation of the nasal membranes (rhinitis).  Gastroesophageal reflux disease (GERD).  Exercise or strenuous activity. SYMPTOMS Symptoms may occur immediately after asthma is triggered or many hours later.  Symptoms include:  Wheezing.  Excessive nighttime or early morning coughing.  Frequent or severe coughing with a common cold.  Chest tightness.  Shortness of breath. DIAGNOSIS  The diagnosis of asthma is made by a review of your medical history and a physical exam. Tests may also be performed. These may include:  Lung function studies. These tests show how much air you breathe in and out.  Allergy tests.  Imaging tests such as X-rays. TREATMENT  Asthma cannot be cured, but it can usually be controlled. Treatment involves identifying and avoiding your asthma triggers. It also involves medicines. There are 2 classes of medicine used for asthma treatment:   Controller medicines. These prevent asthma symptoms from occurring. They are usually taken every day.  Reliever or rescue medicines. These quickly relieve asthma symptoms. They are used as needed and provide short-term relief. Your health care provider will help you create an asthma action plan. An asthma action plan is a written plan for managing and treating your asthma attacks. It includes a list of your asthma triggers and how they may be avoided. It also includes information on when medicines should be taken and when their dosage should be changed. An action plan may also involve the use of a device called a peak flow meter. A peak flow meter measures how well the lungs are working. It helps you monitor your condition. HOME CARE INSTRUCTIONS   Take medicines only as directed by your health care provider. Speak with your health care provider if you have questions about how or when to take the medicines.  Use a peak flow meter as directed by your health care provider. Record and keep track of readings.  Understand and use the action plan to help minimize or stop an asthma attack without needing to seek medical care.  Control your home environment in the following ways to help prevent asthma attacks:  Do not smoke. Avoid being  exposed to secondhand smoke.  Change your heating and air conditioning filter regularly.  Limit your use of fireplaces and wood stoves.  Get rid of pests (such as roaches and mice) and their droppings.  Throw away plants if you see mold on them.  Clean your floors and dust regularly. Use unscented cleaning products.  Try to have someone else vacuum for you regularly. Stay out of rooms while they are being vacuumed and for a short while afterward. If you vacuum, use a dust mask from a hardware store, a double-layered or microfilter vacuum cleaner bag, or a vacuum cleaner with a HEPA filter.  Replace carpet with wood, tile, or vinyl flooring. Carpet can trap dander and dust.  Use allergy-proof pillows, mattress covers, and box spring covers.  Wash bed sheets and blankets every week in hot water and dry them in a dryer.  Use blankets that are made of polyester or cotton.  Clean bathrooms and kitchens with bleach. If possible, have someone repaint the walls in these rooms with mold-resistant paint. Keep out of the rooms that are being cleaned and painted.  Wash hands frequently. SEEK MEDICAL CARE IF:   You have wheezing, shortness of breath, or a cough even if taking medicine to prevent attacks.  The colored mucus you cough up (sputum) is thicker than usual.  Your sputum changes from clear or white to  yellow, green, gray, or bloody.  You have any problems that may be related to the medicines you are taking (such as a rash, itching, swelling, or trouble breathing).  You are using a reliever medicine more than 2-3 times per week.  Your peak flow is still at 50-79% of your personal best after following your action plan for 1 hour.  You have a fever. SEEK IMMEDIATE MEDICAL CARE IF:   You seem to be getting worse and are unresponsive to treatment during an asthma attack.  You are short of breath even at rest.  You get short of breath when doing very little physical  activity.  You have difficulty eating, drinking, or talking due to asthma symptoms.  You develop chest pain.  You develop a fast heartbeat.  You have a bluish color to your lips or fingernails.  You are light-headed, dizzy, or faint.  Your peak flow is less than 50% of your personal best. MAKE SURE YOU:   Understand these instructions.  Will watch your condition.  Will get help right away if you are not doing well or get worse. Document Released: 04/11/2005 Document Revised: 08/26/2013 Document Reviewed: 11/08/2012 [facility] Patient Information 2015 [place], [place]. This information is not intended to replace advice given to you by your health care provider. Make sure you discuss any questions you have with your health care provider.

## 2014-08-13 LAB — CMP14+EGFR
A/G RATIO: 2 (ref 1.1–2.5)
ALK PHOS: 55 IU/L (ref 39–117)
ALT: 21 IU/L (ref 0–32)
AST: 18 IU/L (ref 0–40)
Albumin: 4.4 g/dL (ref 3.5–4.7)
BUN / CREAT RATIO: 21 (ref 11–26)
BUN: 26 mg/dL (ref 8–27)
Bilirubin Total: 0.3 mg/dL (ref 0.0–1.2)
CHLORIDE: 93 mmol/L — AB (ref 97–108)
CO2: 22 mmol/L (ref 18–29)
Calcium: 9.2 mg/dL (ref 8.7–10.3)
Creatinine, Ser: 1.23 mg/dL — ABNORMAL HIGH (ref 0.57–1.00)
GFR calc non Af Amer: 41 mL/min/{1.73_m2} — ABNORMAL LOW (ref >59)
GFR, EST AFRICAN AMERICAN: 47 mL/min/{1.73_m2} — AB (ref >59)
Globulin, Total: 2.2 g/dL (ref 1.5–4.5)
Glucose: 93 mg/dL (ref 65–99)
Potassium: 4.5 mmol/L (ref 3.5–5.2)
SODIUM: 133 mmol/L — AB (ref 134–144)
Total Protein: 6.6 g/dL (ref 6.0–8.5)

## 2014-08-18 ENCOUNTER — Other Ambulatory Visit: Payer: Self-pay | Admitting: Family

## 2014-08-20 ENCOUNTER — Other Ambulatory Visit: Payer: Self-pay | Admitting: Family

## 2014-08-21 NOTE — Telephone Encounter (Signed)
Last seen 08/12/14 [facility]

## 2014-08-26 ENCOUNTER — Telehealth: Payer: Self-pay

## 2014-08-26 NOTE — Telephone Encounter (Signed)
I have to do prior authorization for Promethazine  Please advise me on the diagnosis why she takes nightly?

## 2014-08-26 NOTE — Telephone Encounter (Signed)
Insurance denied prior authorization for Promethazine HCL because in over 65 this is a high risk medication   They suggest Ondansetron

## 2014-08-27 MED ORDER — TEMAZEPAM 15 MG PO CAPS: 15.0000 mg | ORAL_CAPSULE | Freq: Every evening | ORAL | Status: DC | PRN

## 2014-08-27 NOTE — Telephone Encounter (Signed)
Insureance denied phenergan. New rx sent of Restoril 15 mg.

## 2014-08-28 NOTE — Telephone Encounter (Signed)
Pt aware new Rx sent to pharmacy °

## 2014-09-27 ENCOUNTER — Other Ambulatory Visit: Payer: Self-pay | Admitting: Family

## 2014-10-11 ENCOUNTER — Other Ambulatory Visit: Payer: Self-pay | Admitting: Family

## 2014-10-19 ENCOUNTER — Other Ambulatory Visit: Payer: Self-pay | Admitting: Family

## 2014-11-12 ENCOUNTER — Other Ambulatory Visit: Payer: Self-pay | Admitting: Family

## 2014-11-12 NOTE — Telephone Encounter (Signed)
Last seen 08/12/14 [name]   Requesting 90 day supply

## 2014-11-13 ENCOUNTER — Encounter: Payer: Self-pay | Admitting: *Deleted

## 2014-11-26 ENCOUNTER — Other Ambulatory Visit: Payer: Self-pay | Admitting: Family

## 2014-12-08 ENCOUNTER — Other Ambulatory Visit: Payer: Self-pay | Admitting: Family

## 2014-12-08 DIAGNOSIS — M25512 Pain in left shoulder: Secondary | ICD-10-CM

## 2014-12-08 MED ORDER — ACETAMINOPHEN-CODEINE 300-30 MG PO TABS: 2.0000 | ORAL_TABLET | Freq: Every evening | ORAL | Status: DC | PRN

## 2014-12-08 NOTE — Telephone Encounter (Signed)
Patient aware that Rx will be ready for pick up after lunch today

## 2014-12-08 NOTE — Telephone Encounter (Signed)
RX ready for pick up 

## 2014-12-26 ENCOUNTER — Other Ambulatory Visit: Payer: Self-pay | Admitting: Family

## 2015-01-07 ENCOUNTER — Other Ambulatory Visit: Payer: Self-pay | Admitting: Family

## 2015-01-08 ENCOUNTER — Other Ambulatory Visit: Payer: Self-pay | Admitting: Family

## 2015-01-12 ENCOUNTER — Other Ambulatory Visit: Payer: Self-pay | Admitting: *Deleted

## 2015-01-12 MED ORDER — MONTELUKAST SODIUM 10 MG PO TABS: ORAL_TABLET | ORAL | Status: DC

## 2015-01-18 ENCOUNTER — Other Ambulatory Visit: Payer: Self-pay | Admitting: Family

## 2015-02-12 ENCOUNTER — Ambulatory Visit (INDEPENDENT_AMBULATORY_CARE_PROVIDER_SITE_OTHER): Payer: Medicare Other | Admitting: Family

## 2015-02-12 ENCOUNTER — Encounter: Payer: Self-pay | Admitting: Family

## 2015-02-12 VITALS — BP 134/60 | HR 59 | Temp 97.3°F | Ht 61.0 in | Wt 143.2 lb

## 2015-02-12 DIAGNOSIS — E785 Hyperlipidemia, unspecified: Secondary | ICD-10-CM

## 2015-02-12 DIAGNOSIS — E669 Obesity, unspecified: Secondary | ICD-10-CM | POA: Diagnosis not present

## 2015-02-12 DIAGNOSIS — K219 Gastro-esophageal reflux disease without esophagitis: Secondary | ICD-10-CM

## 2015-02-12 DIAGNOSIS — J4520 Mild intermittent asthma, uncomplicated: Secondary | ICD-10-CM | POA: Diagnosis not present

## 2015-02-12 DIAGNOSIS — M25512 Pain in left shoulder: Secondary | ICD-10-CM

## 2015-02-12 DIAGNOSIS — Z23 Encounter for immunization: Secondary | ICD-10-CM

## 2015-02-12 DIAGNOSIS — G894 Chronic pain syndrome: Secondary | ICD-10-CM | POA: Diagnosis not present

## 2015-02-12 DIAGNOSIS — I25119 Atherosclerotic heart disease of native coronary artery with unspecified angina pectoris: Secondary | ICD-10-CM

## 2015-02-12 DIAGNOSIS — I6523 Occlusion and stenosis of bilateral carotid arteries: Secondary | ICD-10-CM

## 2015-02-12 DIAGNOSIS — G4700 Insomnia, unspecified: Secondary | ICD-10-CM | POA: Diagnosis not present

## 2015-02-12 DIAGNOSIS — F329 Major depressive disorder, single episode, unspecified: Secondary | ICD-10-CM

## 2015-02-12 DIAGNOSIS — F32A Depression, unspecified: Secondary | ICD-10-CM

## 2015-02-12 MED ORDER — FLUTICASONE FUROATE-VILANTEROL 200-25 MCG/INH IN AEPB
1.0000 | INHALATION_SPRAY | Freq: Every day | RESPIRATORY_TRACT | Status: DC
Start: 2015-02-12 — End: 2015-05-25

## 2015-02-12 MED ORDER — ACETAMINOPHEN-CODEINE 300-30 MG PO TABS: 2.0000 | ORAL_TABLET | Freq: Every evening | ORAL | Status: DC | PRN

## 2015-02-12 NOTE — Progress Notes (Signed)
Subjective:    Patient ID: [name], female    DOB: [birth year], 81 y.o.   MRN: [id number]  Pt presents to the office today for chronic follow up.  Hypertension This is a chronic problem. The current episode started more than 1 year ago. The problem has been resolved since onset. The problem is controlled. Pertinent negatives include no chest pain, headaches, malaise/fatigue, palpitations, peripheral edema or shortness of breath. Past treatments include calcium channel blockers and beta blockers. The current treatment provides moderate improvement. Hypertensive end-organ damage includes CAD/MI and heart failure. There is no history of kidney disease, CVA or a thyroid problem. There is no history of sleep apnea.  Hyperlipidemia This is a chronic problem. The current episode started more than 1 year ago. The problem is uncontrolled. Recent lipid tests were reviewed and are high. Exacerbating diseases include diabetes. She has no history of hypothyroidism. Factors aggravating her hyperlipidemia include fatty foods. Pertinent negatives include no chest pain, leg pain, myalgias or shortness of breath. Current antihyperlipidemic treatment includes diet change. The current treatment provides no improvement of lipids. Risk factors for coronary artery disease include diabetes mellitus, family history, dyslipidemia, hypertension, obesity and post-menopausal.  Asthma She complains of cough, frequent throat clearing and hoarse voice. There is no hemoptysis or shortness of breath. This is a chronic problem. The current episode started more than 1 year ago. The problem occurs intermittently. The problem has been waxing and waning. Pertinent negatives include no chest pain, headaches, malaise/fatigue, myalgias or sore throat. Her symptoms are alleviated by leukotriene antagonist, rest and ipratropium. She reports moderate improvement on treatment. Her past medical history is significant for asthma.    Gastroesophageal Reflux She complains of coughing and a hoarse voice. She reports no chest pain, no globus sensation, no sore throat or no water brash. This is a chronic problem. The current episode started more than 1 year ago. The problem occurs rarely. The problem has been resolved. Pertinent negatives include no fatigue. She has tried a PPI for the symptoms. The treatment provided significant relief.      Review of Systems  Constitutional: Negative.  Negative for malaise/fatigue and fatigue.  HENT: Positive for hoarse voice. Negative for sore throat.   Eyes: Positive for redness.  Respiratory: Positive for cough. Negative for hemoptysis and shortness of breath.   Cardiovascular: Negative.  Negative for chest pain and palpitations.  Gastrointestinal: Negative.   Endocrine: Negative.   Genitourinary: Negative.   Musculoskeletal: Negative.  Negative for myalgias.  Neurological: Negative.  Negative for headaches.  Hematological: Negative.   Psychiatric/Behavioral: Negative.   All other systems reviewed and are negative.      Objective:   Physical Exam  Constitutional: She is oriented to person, place, and time. She appears well-developed and well-nourished. No distress.  HENT:  Head: Normocephalic and atraumatic.  Right Ear: External ear normal.  Left Ear: External ear normal.  Nose: Nose normal.  Mouth/Throat: Oropharynx is clear and moist.  Eyes: Pupils are equal, round, and reactive to light.  Neck: Normal range of motion. Neck supple. No thyromegaly present.  Cardiovascular: Normal rate, regular rhythm, normal heart sounds and intact distal pulses.   No murmur heard. Pulmonary/Chest: Effort normal and breath sounds normal. No respiratory distress. She has no wheezes.  Abdominal: Soft. Bowel sounds are normal. She exhibits no distension. There is no tenderness.  Musculoskeletal: Normal range of motion. She exhibits no edema or tenderness.  Neurological: She is alert and  oriented to person,  place, and time. She has normal reflexes. No cranial nerve deficit.  Skin: Skin is warm and dry.  Psychiatric: She has a normal mood and affect. Her behavior is normal. Judgment and thought content normal.  Vitals reviewed.     BP 134/60 mmHg  Pulse 59  Temp(Src) 97.3 F (36.3 C) (Oral)  Ht 5' 1"  (1.549 m)  Wt 143 lb 3.2 oz (64.955 kg)  BMI 27.07 kg/m2     Assessment & Plan:  1. Atherosclerosis of native coronary artery of native heart with angina pectoris ([place]) - CMP14+EGFR  2. Asthma, chronic, mild intermittent, uncomplicated -Pt's Symbicort changed to [employer] related insurance coverage - CMP14+EGFR - Fluticasone Furoate-Vilanterol (BREO ELLIPTA) 200-25 MCG/INH AEPB; Inhale 1 Inhaler into the lungs daily.  Dispense: 28 each; Refill: 11  3. Gastroesophageal reflux disease without esophagitis - CMP14+EGFR  4. Chronic pain syndrome - CMP14+EGFR  5. Depression - CMP14+EGFR  6. Hyperlipidemia - CMP14+EGFR  7. Insomnia  - CMP14+EGFR  8. Obesity, unspecified  - CMP14+EGFR  9. Shoulder pain, left  - CMP14+EGFR - Acetaminophen-Codeine 300-30 MG tablet; Take 2 tablets by mouth at bedtime as needed.  Dispense: 60 tablet; Refill: 3   Continue all meds Labs pending Health Maintenance reviewed- Prevnar given today Diet and exercise encouraged RTO 6 months   [name], FNP

## 2015-02-12 NOTE — Patient Instructions (Signed)
Asthma, Adult Asthma is a recurring condition in which the airways tighten and narrow. Asthma can make it difficult to breathe. It can cause coughing, wheezing, and shortness of breath. Asthma episodes, also called asthma attacks, range from minor to life-threatening. Asthma cannot be cured, but medicines and lifestyle changes can help control it. CAUSES Asthma is believed to be caused by inherited (genetic) and environmental factors, but its exact cause is unknown. Asthma may be triggered by allergens, lung infections, or irritants in the air. Asthma triggers are different for each person. Common triggers include:   Animal dander.  Dust mites.  Cockroaches.  Pollen from trees or grass.  Mold.  Smoke.  Air pollutants such as dust, household cleaners, hair sprays, aerosol sprays, paint fumes, strong chemicals, or strong odors.  Cold air, weather changes, and winds (which increase molds and pollens in the air).  Strong emotional expressions such as crying or laughing hard.  Stress.  Certain medicines (such as aspirin) or types of drugs (such as beta-blockers).  Sulfites in foods and drinks. Foods and drinks that may contain sulfites include dried fruit, potato chips, and sparkling grape juice.  Infections or inflammatory conditions such as the flu, a cold, or an inflammation of the nasal membranes (rhinitis).  Gastroesophageal reflux disease (GERD).  Exercise or strenuous activity. SYMPTOMS Symptoms may occur immediately after asthma is triggered or many hours later. Symptoms include:  Wheezing.  Excessive nighttime or early morning coughing.  Frequent or severe coughing with a common cold.  Chest tightness.  Shortness of breath. DIAGNOSIS  The diagnosis of asthma is made by a review of your medical history and a physical exam. Tests may also be performed. These may include:  Lung function studies. These tests show how much air you breathe in and out.  Allergy  tests.  Imaging tests such as X-rays. TREATMENT  Asthma cannot be cured, but it can usually be controlled. Treatment involves identifying and avoiding your asthma triggers. It also involves medicines. There are 2 classes of medicine used for asthma treatment:   Controller medicines. These prevent asthma symptoms from occurring. They are usually taken every day.  Reliever or rescue medicines. These quickly relieve asthma symptoms. They are used as needed and provide short-term relief. Your health care provider will help you create an asthma action plan. An asthma action plan is a written plan for managing and treating your asthma attacks. It includes a list of your asthma triggers and how they may be avoided. It also includes information on when medicines should be taken and when their dosage should be changed. An action plan may also involve the use of a device called a peak flow meter. A peak flow meter measures how well the lungs are working. It helps you monitor your condition. HOME CARE INSTRUCTIONS   Take medicines only as directed by your health care provider. Speak with your health care provider if you have questions about how or when to take the medicines.  Use a peak flow meter as directed by your health care provider. Record and keep track of readings.  Understand and use the action plan to help minimize or stop an asthma attack without needing to seek medical care.  Control your home environment in the following ways to help prevent asthma attacks:  Do not smoke. Avoid being exposed to secondhand smoke.  Change your heating and air conditioning filter regularly.  Limit your use of fireplaces and wood stoves.  Get rid of pests (such as roaches   and mice) and their droppings.  Throw away plants if you see mold on them.  Clean your floors and dust regularly. Use unscented cleaning products.  Try to have someone else vacuum for you regularly. Stay out of rooms while they are  being vacuumed and for a short while afterward. If you vacuum, use a dust mask from a hardware store, a double-layered or microfilter vacuum cleaner bag, or a vacuum cleaner with a HEPA filter.  Replace carpet with wood, tile, or vinyl flooring. Carpet can trap dander and dust.  Use allergy-proof pillows, mattress covers, and box spring covers.  Wash bed sheets and blankets every week in hot water and dry them in a dryer.  Use blankets that are made of polyester or cotton.  Clean bathrooms and kitchens with bleach. If possible, have someone repaint the walls in these rooms with mold-resistant paint. Keep out of the rooms that are being cleaned and painted.  Wash hands frequently. SEEK MEDICAL CARE IF:   You have wheezing, shortness of breath, or a cough even if taking medicine to prevent attacks.  The colored mucus you cough up (sputum) is thicker than usual.  Your sputum changes from clear or white to yellow, green, gray, or bloody.  You have any problems that may be related to the medicines you are taking (such as a rash, itching, swelling, or trouble breathing).  You are using a reliever medicine more than 2-3 times per week.  Your peak flow is still at 50-79% of your personal best after following your action plan for 1 hour.  You have a fever. SEEK IMMEDIATE MEDICAL CARE IF:   You seem to be getting worse and are unresponsive to treatment during an asthma attack.  You are short of breath even at rest.  You get short of breath when doing very little physical activity.  You have difficulty eating, drinking, or talking due to asthma symptoms.  You develop chest pain.  You develop a fast heartbeat.  You have a bluish color to your lips or fingernails.  You are light-headed, dizzy, or faint.  Your peak flow is less than 50% of your personal best.   This information is not intended to replace advice given to you by your health care provider. Make sure you discuss any  questions you have with your health care provider.   Document Released: 04/11/2005 Document Revised: 12/31/2014 Document Reviewed: 11/08/2012 Elsevier Interactive Patient Education 2016 [employer]  

## 2015-02-13 ENCOUNTER — Other Ambulatory Visit: Payer: Self-pay | Admitting: Family

## 2015-02-13 LAB — CMP14+EGFR
ALT: 22 IU/L (ref 0–32)
AST: 22 IU/L (ref 0–40)
Albumin/Globulin Ratio: 1.9 (ref 1.1–2.5)
Albumin: 4.3 g/dL (ref 3.5–4.7)
Alkaline Phosphatase: 66 IU/L (ref 39–117)
BUN/Creatinine Ratio: 31 — ABNORMAL HIGH (ref 11–26)
BUN: 40 mg/dL — AB (ref 8–27)
Bilirubin Total: 0.2 mg/dL (ref 0.0–1.2)
CALCIUM: 9.4 mg/dL (ref 8.7–10.3)
CO2: 22 mmol/L (ref 18–29)
Chloride: 89 mmol/L — ABNORMAL LOW (ref 97–106)
Creatinine, Ser: 1.27 mg/dL — ABNORMAL HIGH (ref 0.57–1.00)
GFR, EST AFRICAN AMERICAN: 45 mL/min/{1.73_m2} — AB (ref >59)
GFR, EST NON AFRICAN AMERICAN: 39 mL/min/{1.73_m2} — AB (ref >59)
GLUCOSE: 96 mg/dL (ref 65–99)
Globulin, Total: 2.3 g/dL (ref 1.5–4.5)
Potassium: 3.7 mmol/L (ref 3.5–5.2)
Sodium: 131 mmol/L — ABNORMAL LOW (ref 136–144)
TOTAL PROTEIN: 6.6 g/dL (ref 6.0–8.5)

## 2015-02-21 ENCOUNTER — Other Ambulatory Visit: Payer: Self-pay | Admitting: Family

## 2015-03-09 DIAGNOSIS — Z23 Encounter for immunization: Secondary | ICD-10-CM | POA: Diagnosis not present

## 2015-03-17 ENCOUNTER — Other Ambulatory Visit: Payer: Self-pay | Admitting: Family

## 2015-03-17 NOTE — Telephone Encounter (Signed)
Last seen 02/12/15  [facility]

## 2015-04-01 ENCOUNTER — Other Ambulatory Visit: Payer: Self-pay | Admitting: Family

## 2015-04-19 ENCOUNTER — Other Ambulatory Visit: Payer: Self-pay | Admitting: Family

## 2015-04-21 ENCOUNTER — Other Ambulatory Visit: Payer: Self-pay

## 2015-04-21 MED ORDER — TRIAMTERENE-HCTZ 75-50 MG PO TABS: 1.0000 | ORAL_TABLET | Freq: Every day | ORAL | Status: DC

## 2015-04-30 ENCOUNTER — Other Ambulatory Visit: Payer: Self-pay | Admitting: Family

## 2015-05-23 ENCOUNTER — Other Ambulatory Visit: Payer: Self-pay | Admitting: Family

## 2015-05-25 ENCOUNTER — Ambulatory Visit (INDEPENDENT_AMBULATORY_CARE_PROVIDER_SITE_OTHER): Payer: Medicare Other | Admitting: Family

## 2015-05-25 ENCOUNTER — Encounter: Payer: Self-pay | Admitting: Family

## 2015-05-25 ENCOUNTER — Ambulatory Visit (INDEPENDENT_AMBULATORY_CARE_PROVIDER_SITE_OTHER): Payer: Medicare Other

## 2015-05-25 VITALS — BP 136/64 | HR 64 | Temp 97.1°F | Ht 61.0 in | Wt 148.2 lb

## 2015-05-25 DIAGNOSIS — M1611 Unilateral primary osteoarthritis, right hip: Secondary | ICD-10-CM | POA: Diagnosis not present

## 2015-05-25 DIAGNOSIS — M25551 Pain in right hip: Secondary | ICD-10-CM | POA: Diagnosis not present

## 2015-05-25 MED ORDER — MELOXICAM 7.5 MG PO TABS: 7.5000 mg | ORAL_TABLET | Freq: Every day | ORAL | Status: DC

## 2015-05-25 MED ORDER — KETOROLAC TROMETHAMINE 60 MG/2ML IM SOLN
30.0000 mg | Freq: Once | INTRAMUSCULAR | Status: AC
  Administered 2015-05-25: 30 mg via INTRAMUSCULAR

## 2015-05-25 MED ORDER — METHYLPREDNISOLONE ACETATE 80 MG/ML IJ SUSP
80.0000 mg | Freq: Once | INTRAMUSCULAR | Status: AC
  Administered 2015-05-25: 80 mg via INTRAMUSCULAR

## 2015-05-25 NOTE — Progress Notes (Signed)
   Subjective:    Patient ID: [name], female    DOB: 03-18-1931, 82 y.o.   MRN: [id number]  Pt presents to the office today with right hip pain. Pt states she feels like it is "popping out joint when she walks".  Hip Pain  The incident occurred 3 to 5 days ago. There was no injury mechanism. The pain is present in the right hip. The quality of the pain is described as aching. The pain is at a severity of 8/10. The pain is mild. The pain has been intermittent since onset. Pertinent negatives include no muscle weakness, numbness or tingling. She reports no foreign bodies present. The symptoms are aggravated by movement. She has tried ice and rest for the symptoms. The treatment provided mild relief.      Review of Systems  Constitutional: Negative.   HENT: Negative.   Eyes: Negative.   Respiratory: Negative.  Negative for shortness of breath.   Cardiovascular: Negative.  Negative for palpitations.  Gastrointestinal: Negative.   Endocrine: Negative.   Genitourinary: Negative.   Musculoskeletal: Negative.   Neurological: Negative.  Negative for tingling, numbness and headaches.  Hematological: Negative.   Psychiatric/Behavioral: Negative.   All other systems reviewed and are negative.      Objective:   Physical Exam  Constitutional: She is oriented to person, place, and time. She appears well-developed and well-nourished. No distress.  HENT:  Head: Normocephalic and atraumatic.  Eyes: Pupils are equal, round, and reactive to light.  Neck: Normal range of motion. Neck supple. No thyromegaly present.  Cardiovascular: Normal rate, regular rhythm, normal heart sounds and intact distal pulses.   No murmur heard. Pulmonary/Chest: Effort normal and breath sounds normal. No respiratory distress. She has no wheezes.  Abdominal: Soft. Bowel sounds are normal. She exhibits no distension. There is no tenderness.  Musculoskeletal: Normal range of motion. She exhibits no edema or  tenderness.  Full ROM of rotation and abducting of right hip  Neurological: She is alert and oriented to person, place, and time. She has normal reflexes. No cranial nerve deficit.  Skin: Skin is warm and dry.  Psychiatric: She has a normal mood and affect. Her behavior is normal. Judgment and thought content normal.  Vitals reviewed.  Hip x-ray- Arthritic changes Preliminary reading by [name], FNP [facility]     Ht 5\' 1"  (1.549 m)  Wt 148 lb 3.2 oz (67.223 kg)  BMI 28.02 kg/m2     Assessment & Plan:  1. Right hip pain - DG HIP UNILAT W OR W/O PELVIS 2-3 VIEWS RIGHT; Future  2. Osteoarthritis of right hip, unspecified osteoarthritis type -Rest -Ice Encouraged ROM of hip and low impact exercises -Take Mobic with food and no other NSAID"S -RTO prn  - ketorolac (TORADOL) injection 30 mg; Inject 1 mL (30 mg total) into the muscle once. - methylPREDNISolone acetate (DEPO-MEDROL) injection 80 mg; Inject 1 mL (80 mg total) into the muscle once. - meloxicam (MOBIC) 7.5 MG tablet; Take 1 tablet (7.5 mg total) by mouth daily.  Dispense: 30 tablet; Refill: 0  [name], FNP

## 2015-05-25 NOTE — Patient Instructions (Signed)

## 2015-07-07 ENCOUNTER — Other Ambulatory Visit: Payer: Self-pay | Admitting: Family

## 2015-07-20 ENCOUNTER — Other Ambulatory Visit: Payer: Self-pay | Admitting: Family

## 2015-08-10 ENCOUNTER — Other Ambulatory Visit: Payer: Self-pay | Admitting: Family

## 2015-08-10 NOTE — Telephone Encounter (Signed)
RX ready for pick up 

## 2015-08-10 NOTE — Telephone Encounter (Signed)
Last seen 05/25/15  [name]  If approved print

## 2015-08-10 NOTE — Telephone Encounter (Signed)
Patient aware rx is ready to be picked up 

## 2015-08-13 ENCOUNTER — Other Ambulatory Visit: Payer: Self-pay | Admitting: Family

## 2015-08-13 ENCOUNTER — Ambulatory Visit (INDEPENDENT_AMBULATORY_CARE_PROVIDER_SITE_OTHER): Payer: Medicare Other | Admitting: Family

## 2015-08-13 ENCOUNTER — Ambulatory Visit: Payer: Medicare Other | Admitting: Family

## 2015-08-13 ENCOUNTER — Encounter: Payer: Self-pay | Admitting: Family

## 2015-08-13 VITALS — BP 135/58 | HR 66 | Temp 97.0°F | Ht 61.0 in | Wt 147.4 lb

## 2015-08-13 DIAGNOSIS — I25119 Atherosclerotic heart disease of native coronary artery with unspecified angina pectoris: Secondary | ICD-10-CM | POA: Diagnosis not present

## 2015-08-13 DIAGNOSIS — F32A Depression, unspecified: Secondary | ICD-10-CM

## 2015-08-13 DIAGNOSIS — G4700 Insomnia, unspecified: Secondary | ICD-10-CM

## 2015-08-13 DIAGNOSIS — K219 Gastro-esophageal reflux disease without esophagitis: Secondary | ICD-10-CM | POA: Diagnosis not present

## 2015-08-13 DIAGNOSIS — I10 Essential (primary) hypertension: Secondary | ICD-10-CM | POA: Diagnosis not present

## 2015-08-13 DIAGNOSIS — J4520 Mild intermittent asthma, uncomplicated: Secondary | ICD-10-CM | POA: Diagnosis not present

## 2015-08-13 DIAGNOSIS — E785 Hyperlipidemia, unspecified: Secondary | ICD-10-CM

## 2015-08-13 DIAGNOSIS — F329 Major depressive disorder, single episode, unspecified: Secondary | ICD-10-CM

## 2015-08-13 DIAGNOSIS — E669 Obesity, unspecified: Secondary | ICD-10-CM | POA: Diagnosis not present

## 2015-08-13 DIAGNOSIS — G894 Chronic pain syndrome: Secondary | ICD-10-CM

## 2015-08-13 MED ORDER — FLUTICASONE FUROATE-VILANTEROL 100-25 MCG/INH IN AEPB: 1.0000 | INHALATION_SPRAY | Freq: Every day | RESPIRATORY_TRACT | Status: DC

## 2015-08-13 MED ORDER — ACETAMINOPHEN-CODEINE #3 300-30 MG PO TABS: ORAL_TABLET | ORAL | Status: DC

## 2015-08-13 MED ORDER — FLUTICASONE PROPIONATE 50 MCG/ACT NA SUSP: NASAL | Status: DC

## 2015-08-13 NOTE — Addendum Note (Signed)
Addended by: [name] A on: 08/13/2015 11:27 AM   Modules accepted: Orders, SmartSet

## 2015-08-13 NOTE — Progress Notes (Signed)
Subjective:    Patient ID: [name], female    DOB: Oct 16, 1930, 82 y.o.   MRN: [id number]  Pt presents to the office today for chronic follow up. PT states she has stopped her Symbicort inhaler because of the price. Pt states she is wheezing and coughing, but it "dealing with it".  Hypertension This is a chronic problem. The current episode started more than 1 year ago. The problem has been resolved since onset. The problem is controlled. Pertinent negatives include no chest pain, headaches, malaise/fatigue, palpitations, peripheral edema or shortness of breath. Past treatments include calcium channel blockers and beta blockers. The current treatment provides moderate improvement. Hypertensive end-organ damage includes CAD/MI and heart failure. There is no history of kidney disease, CVA or a thyroid problem. There is no history of sleep apnea.  Hyperlipidemia This is a chronic problem. The current episode started more than 1 year ago. The problem is uncontrolled. Recent lipid tests were reviewed and are high. Exacerbating diseases include diabetes. She has no history of hypothyroidism. Factors aggravating her hyperlipidemia include fatty foods. Pertinent negatives include no chest pain, leg pain, myalgias or shortness of breath. Current antihyperlipidemic treatment includes diet change. The current treatment provides no improvement of lipids. Risk factors for coronary artery disease include diabetes mellitus, family history, dyslipidemia, hypertension, obesity and post-menopausal.  Asthma She complains of cough, frequent throat clearing, hoarse voice and wheezing. There is no hemoptysis or shortness of breath. This is a chronic problem. The current episode started more than 1 year ago. The problem occurs intermittently. The problem has been waxing and waning. Pertinent negatives include no chest pain, headaches, malaise/fatigue, myalgias or sore throat. Her symptoms are alleviated by leukotriene  antagonist, rest and ipratropium. She reports moderate improvement on treatment. Her past medical history is significant for asthma.  Gastroesophageal Reflux She complains of coughing, a hoarse voice and wheezing. She reports no chest pain, no globus sensation, no sore throat or no water brash. This is a chronic problem. The current episode started more than 1 year ago. The problem occurs rarely. The problem has been resolved. Pertinent negatives include no fatigue. She has tried a PPI for the symptoms. The treatment provided significant relief.      Review of Systems  Constitutional: Negative.  Negative for malaise/fatigue and fatigue.  HENT: Positive for hoarse voice. Negative for sore throat.   Eyes: Positive for redness.  Respiratory: Positive for cough and wheezing. Negative for hemoptysis and shortness of breath.   Cardiovascular: Negative.  Negative for chest pain and palpitations.  Gastrointestinal: Negative.   Endocrine: Negative.   Genitourinary: Negative.   Musculoskeletal: Negative.  Negative for myalgias.  Neurological: Negative.  Negative for headaches.  Hematological: Negative.   Psychiatric/Behavioral: Negative.   All other systems reviewed and are negative.      Objective:   Physical Exam  Constitutional: She is oriented to person, place, and time. She appears well-developed and well-nourished. No distress.  HENT:  Head: Normocephalic and atraumatic.  Right Ear: External ear normal.  Left Ear: External ear normal.  Nose: Nose normal.  Mouth/Throat: Oropharynx is clear and moist.  Eyes: Pupils are equal, round, and reactive to light.  Neck: Normal range of motion. Neck supple. No thyromegaly present.  Cardiovascular: Normal rate, regular rhythm, normal heart sounds and intact distal pulses.   No murmur heard. Pulmonary/Chest: Effort normal and breath sounds normal. No respiratory distress. She has no wheezes.  Abdominal: Soft. Bowel sounds are normal. She  exhibits  no distension. There is no tenderness.  Musculoskeletal: Normal range of motion. She exhibits no edema or tenderness.  Neurological: She is alert and oriented to person, place, and time. She has normal reflexes. No cranial nerve deficit.  Skin: Skin is warm and dry.  Psychiatric: She has a normal mood and affect. Her behavior is normal. Judgment and thought content normal.  Vitals reviewed.     BP 135/58 mmHg  Pulse 66  Temp(Src) 97 F (36.1 C) (Oral)  Ht 5' 1"  (1.549 m)  Wt 147 lb 6.4 oz (66.86 kg)  BMI 27.87 kg/m2     Assessment & Plan:  1. Essential hypertension, benign - CMP14+EGFR  2. Atherosclerosis of native coronary artery of native heart with angina pectoris ([place]) - CMP14+EGFR  3. Gastroesophageal reflux disease without esophagitis - CMP14+EGFR  4. Asthma, chronic, mild intermittent, uncomplicated -Pt started on Breo today (samples given) - CMP14+EGFR - fluticasone furoate-vilanterol (BREO ELLIPTA) 100-25 MCG/INH AEPB; Inhale 1 puff into the lungs daily.  Dispense: 28 each; Refill: 3  5. Hyperlipidemia - CMP14+EGFR  6. Obesity, unspecified - CMP14+EGFR  7. Chronic pain syndrome - CMP14+EGFR  8. Insomnia - CMP14+EGFR  9. Depression - CMP14+EGFR   Continue all meds Labs pending Health Maintenance reviewed Diet and exercise encouraged RTO 6 months  [name], FNP

## 2015-08-13 NOTE — Patient Instructions (Signed)
Asthma, Adult Asthma is a recurring condition in which the airways tighten and narrow. Asthma can make it difficult to breathe. It can cause coughing, wheezing, and shortness of breath. Asthma episodes, also called asthma attacks, range from minor to life-threatening. Asthma cannot be cured, but medicines and lifestyle changes can help control it. CAUSES Asthma is believed to be caused by inherited (genetic) and environmental factors, but its exact cause is unknown. Asthma may be triggered by allergens, lung infections, or irritants in the air. Asthma triggers are different for each person. Common triggers include:   Animal dander.  Dust mites.  Cockroaches.  Pollen from trees or grass.  Mold.  Smoke.  Air pollutants such as dust, household cleaners, hair sprays, aerosol sprays, paint fumes, strong chemicals, or strong odors.  Cold air, weather changes, and winds (which increase molds and pollens in the air).  Strong emotional expressions such as crying or laughing hard.  Stress.  Certain medicines (such as aspirin) or types of drugs (such as beta-blockers).  Sulfites in foods and drinks. Foods and drinks that may contain sulfites include dried fruit, potato chips, and sparkling grape juice.  Infections or inflammatory conditions such as the flu, a cold, or an inflammation of the nasal membranes (rhinitis).  Gastroesophageal reflux disease (GERD).  Exercise or strenuous activity. SYMPTOMS Symptoms may occur immediately after asthma is triggered or many hours later. Symptoms include:  Wheezing.  Excessive nighttime or early morning coughing.  Frequent or severe coughing with a common cold.  Chest tightness.  Shortness of breath. DIAGNOSIS  The diagnosis of asthma is made by a review of your medical history and a physical exam. Tests may also be performed. These may include:  Lung function studies. These tests show how much air you breathe in and out.  Allergy  tests.  Imaging tests such as X-rays. TREATMENT  Asthma cannot be cured, but it can usually be controlled. Treatment involves identifying and avoiding your asthma triggers. It also involves medicines. There are 2 classes of medicine used for asthma treatment:   Controller medicines. These prevent asthma symptoms from occurring. They are usually taken every day.  Reliever or rescue medicines. These quickly relieve asthma symptoms. They are used as needed and provide short-term relief. Your health care provider will help you create an asthma action plan. An asthma action plan is a written plan for managing and treating your asthma attacks. It includes a list of your asthma triggers and how they may be avoided. It also includes information on when medicines should be taken and when their dosage should be changed. An action plan may also involve the use of a device called a peak flow meter. A peak flow meter measures how well the lungs are working. It helps you monitor your condition. HOME CARE INSTRUCTIONS   Take medicines only as directed by your health care provider. Speak with your health care provider if you have questions about how or when to take the medicines.  Use a peak flow meter as directed by your health care provider. Record and keep track of readings.  Understand and use the action plan to help minimize or stop an asthma attack without needing to seek medical care.  Control your home environment in the following ways to help prevent asthma attacks:  Do not smoke. Avoid being exposed to secondhand smoke.  Change your heating and air conditioning filter regularly.  Limit your use of fireplaces and wood stoves.  Get rid of pests (such as roaches   and mice) and their droppings.  Throw away plants if you see mold on them.  Clean your floors and dust regularly. Use unscented cleaning products.  Try to have someone else vacuum for you regularly. Stay out of rooms while they are  being vacuumed and for a short while afterward. If you vacuum, use a dust mask from a hardware store, a double-layered or microfilter vacuum cleaner bag, or a vacuum cleaner with a HEPA filter.  Replace carpet with wood, tile, or vinyl flooring. Carpet can trap dander and dust.  Use allergy-proof pillows, mattress covers, and box spring covers.  Wash bed sheets and blankets every week in hot water and dry them in a dryer.  Use blankets that are made of polyester or cotton.  Clean bathrooms and kitchens with bleach. If possible, have someone repaint the walls in these rooms with mold-resistant paint. Keep out of the rooms that are being cleaned and painted.  Wash hands frequently. SEEK MEDICAL CARE IF:   You have wheezing, shortness of breath, or a cough even if taking medicine to prevent attacks.  The colored mucus you cough up (sputum) is thicker than usual.  Your sputum changes from clear or white to yellow, green, gray, or bloody.  You have any problems that may be related to the medicines you are taking (such as a rash, itching, swelling, or trouble breathing).  You are using a reliever medicine more than 2-3 times per week.  Your peak flow is still at 50-79% of your personal best after following your action plan for 1 hour.  You have a fever. SEEK IMMEDIATE MEDICAL CARE IF:   You seem to be getting worse and are unresponsive to treatment during an asthma attack.  You are short of breath even at rest.  You get short of breath when doing very little physical activity.  You have difficulty eating, drinking, or talking due to asthma symptoms.  You develop chest pain.  You develop a fast heartbeat.  You have a bluish color to your lips or fingernails.  You are light-headed, dizzy, or faint.  Your peak flow is less than 50% of your personal best.   This information is not intended to replace advice given to you by your health care provider. Make sure you discuss any  questions you have with your health care provider.   Document Released: 04/11/2005 Document Revised: 12/31/2014 Document Reviewed: 11/08/2012 Elsevier Interactive Patient Education 2016 [employer]  

## 2015-08-14 LAB — CMP14+EGFR
ALT: 22 IU/L (ref 0–32)
AST: 19 IU/L (ref 0–40)
Albumin/Globulin Ratio: 1.8 (ref 1.2–2.2)
Albumin: 4.2 g/dL (ref 3.5–4.7)
Alkaline Phosphatase: 69 IU/L (ref 39–117)
BUN/Creatinine Ratio: 25 (ref 12–28)
BUN: 27 mg/dL (ref 8–27)
Bilirubin Total: 0.2 mg/dL (ref 0.0–1.2)
CO2: 21 mmol/L (ref 18–29)
Calcium: 9.2 mg/dL (ref 8.7–10.3)
Chloride: 93 mmol/L — ABNORMAL LOW (ref 96–106)
Creatinine, Ser: 1.1 mg/dL — ABNORMAL HIGH (ref 0.57–1.00)
GFR calc Af Amer: 54 mL/min/{1.73_m2} — ABNORMAL LOW (ref >59)
GFR calc non Af Amer: 47 mL/min/{1.73_m2} — ABNORMAL LOW (ref >59)
Globulin, Total: 2.3 g/dL (ref 1.5–4.5)
Glucose: 87 mg/dL (ref 65–99)
Potassium: 4.2 mmol/L (ref 3.5–5.2)
Sodium: 134 mmol/L (ref 134–144)
Total Protein: 6.5 g/dL (ref 6.0–8.5)

## 2015-08-17 ENCOUNTER — Other Ambulatory Visit: Payer: Self-pay | Admitting: Family

## 2015-08-24 ENCOUNTER — Other Ambulatory Visit: Payer: Self-pay | Admitting: Family

## 2015-09-11 DIAGNOSIS — H353113 Nonexudative age-related macular degeneration, right eye, advanced atrophic without subfoveal involvement: Secondary | ICD-10-CM | POA: Diagnosis not present

## 2015-09-11 DIAGNOSIS — H43813 Vitreous degeneration, bilateral: Secondary | ICD-10-CM | POA: Diagnosis not present

## 2015-09-11 DIAGNOSIS — H353121 Nonexudative age-related macular degeneration, left eye, early dry stage: Secondary | ICD-10-CM | POA: Diagnosis not present

## 2015-09-11 DIAGNOSIS — H43812 Vitreous degeneration, left eye: Secondary | ICD-10-CM | POA: Diagnosis not present

## 2015-10-02 ENCOUNTER — Other Ambulatory Visit: Payer: Self-pay | Admitting: Family

## 2015-10-02 ENCOUNTER — Telehealth: Payer: Self-pay | Admitting: Family

## 2015-11-02 ENCOUNTER — Other Ambulatory Visit: Payer: Self-pay | Admitting: Family

## 2015-11-07 ENCOUNTER — Other Ambulatory Visit: Payer: Self-pay | Admitting: Family

## 2015-11-09 ENCOUNTER — Other Ambulatory Visit: Payer: Self-pay | Admitting: Family

## 2015-11-12 ENCOUNTER — Other Ambulatory Visit: Payer: Self-pay | Admitting: Family

## 2015-11-12 NOTE — Telephone Encounter (Signed)
Last filled 10/13/15, last seen 08/13/15. Route to pool, call in at [facility]

## 2015-11-12 NOTE — Telephone Encounter (Signed)
Pt aware rx is ready for pickup.  

## 2015-11-12 NOTE — Telephone Encounter (Signed)
RX ready for pick up 

## 2016-01-01 ENCOUNTER — Other Ambulatory Visit: Payer: Self-pay | Admitting: Family

## 2016-01-27 DIAGNOSIS — Z23 Encounter for immunization: Secondary | ICD-10-CM | POA: Diagnosis not present

## 2016-02-10 ENCOUNTER — Other Ambulatory Visit: Payer: Self-pay | Admitting: Family

## 2016-02-11 ENCOUNTER — Encounter: Payer: Self-pay | Admitting: Family

## 2016-02-11 ENCOUNTER — Ambulatory Visit (INDEPENDENT_AMBULATORY_CARE_PROVIDER_SITE_OTHER): Payer: Medicare Other | Admitting: Family

## 2016-02-11 VITALS — BP 126/62 | HR 72 | Temp 97.1°F | Ht 61.0 in | Wt 145.4 lb

## 2016-02-11 DIAGNOSIS — I10 Essential (primary) hypertension: Secondary | ICD-10-CM | POA: Diagnosis not present

## 2016-02-11 DIAGNOSIS — G894 Chronic pain syndrome: Secondary | ICD-10-CM

## 2016-02-11 DIAGNOSIS — E663 Overweight: Secondary | ICD-10-CM

## 2016-02-11 DIAGNOSIS — K219 Gastro-esophageal reflux disease without esophagitis: Secondary | ICD-10-CM | POA: Diagnosis not present

## 2016-02-11 DIAGNOSIS — J4520 Mild intermittent asthma, uncomplicated: Secondary | ICD-10-CM | POA: Diagnosis not present

## 2016-02-11 DIAGNOSIS — E782 Mixed hyperlipidemia: Secondary | ICD-10-CM | POA: Diagnosis not present

## 2016-02-11 DIAGNOSIS — G4700 Insomnia, unspecified: Secondary | ICD-10-CM

## 2016-02-11 DIAGNOSIS — F331 Major depressive disorder, recurrent, moderate: Secondary | ICD-10-CM

## 2016-02-11 DIAGNOSIS — I25119 Atherosclerotic heart disease of native coronary artery with unspecified angina pectoris: Secondary | ICD-10-CM

## 2016-02-11 MED ORDER — ACETAMINOPHEN-CODEINE #3 300-30 MG PO TABS: ORAL_TABLET | ORAL | 2 refills | Status: DC

## 2016-02-11 MED ORDER — PROMETHAZINE HCL 25 MG PO TABS: 50.0000 mg | ORAL_TABLET | Freq: Every day | ORAL | 2 refills | Status: DC

## 2016-02-11 NOTE — Patient Instructions (Signed)
Fall Prevention in the Home  Falls can cause injuries and can affect people from all age groups. There are many simple things that you can do to make your home safe and to help prevent falls. WHAT CAN I DO ON THE OUTSIDE OF MY HOME?  Regularly repair the edges of walkways and driveways and fix any cracks.  Remove high doorway thresholds.  Trim any shrubbery on the main path into your home.  Use bright outdoor lighting.  Clear walkways of debris and clutter, including tools and rocks.  Regularly check that handrails are securely fastened and in good repair. Both sides of any steps should have handrails.  Install guardrails along the edges of any raised decks or porches.  Have leaves, snow, and ice cleared regularly.  Use sand or salt on walkways during winter months.  In the garage, clean up any spills right away, including grease or oil spills. WHAT CAN I DO IN THE BATHROOM?  Use night lights.  Install grab bars by the toilet and in the tub and shower. Do not use towel bars as grab bars.  Use non-skid mats or decals on the floor of the tub or shower.  If you need to sit down while you are in the shower, use a plastic, non-slip stool..  Keep the floor dry. Immediately clean up any water that spills on the floor.  Remove soap buildup in the tub or shower on a regular basis.  Attach bath mats securely with double-sided non-slip rug tape.  Remove throw rugs and other tripping hazards from the floor. WHAT CAN I DO IN THE BEDROOM?  Use night lights.  Make sure that a bedside light is easy to reach.  Do not use oversized bedding that drapes onto the floor.  Have a firm chair that has side arms to use for getting dressed.  Remove throw rugs and other tripping hazards from the floor. WHAT CAN I DO IN THE KITCHEN?   Clean up any spills right away.  Avoid walking on wet floors.  Place frequently used items in easy-to-reach places.  If you need to reach for something  above you, use a sturdy step stool that has a grab bar.  Keep electrical cables out of the way.  Do not use floor polish or wax that makes floors slippery. If you have to use wax, make sure that it is non-skid floor wax.  Remove throw rugs and other tripping hazards from the floor. WHAT CAN I DO IN THE STAIRWAYS?  Do not leave any items on the stairs.  Make sure that there are handrails on both sides of the stairs. Fix handrails that are broken or loose. Make sure that handrails are as long as the stairways.  Check any carpeting to make sure that it is firmly attached to the stairs. Fix any carpet that is loose or worn.  Avoid having throw rugs at the top or bottom of stairways, or secure the rugs with carpet tape to prevent them from moving.  Make sure that you have a light switch at the top of the stairs and the bottom of the stairs. If you do not have them, have them installed. WHAT ARE SOME OTHER FALL PREVENTION TIPS?  Wear closed-toe shoes that fit well and support your feet. Wear shoes that have rubber soles or low heels.  When you use a stepladder, make sure that it is completely opened and that the sides are firmly locked. Have someone hold the ladder while you   are using it. Do not climb a closed stepladder.  Add color or contrast paint or tape to grab bars and handrails in your home. Place contrasting color strips on the first and last steps.  Use mobility aids as needed, such as canes, walkers, scooters, and crutches.  Turn on lights if it is dark. Replace any light bulbs that burn out.  Set up furniture so that there are clear paths. Keep the furniture in the same spot.  Fix any uneven floor surfaces.  Choose a carpet design that does not hide the edge of steps of a stairway.  Be aware of any and all pets.  Review your medicines with your healthcare provider. Some medicines can cause dizziness or changes in blood pressure, which increase your risk of falling. Talk  with your health care provider about other ways that you can decrease your risk of falls. This may include working with a physical therapist or trainer to improve your strength, balance, and endurance.   This information is not intended to replace advice given to you by your health care provider. Make sure you discuss any questions you have with your health care provider.   Document Released: 04/01/2002 Document Revised: 08/26/2014 Document Reviewed: 05/16/2014 Elsevier Interactive Patient Education 2016 Elsevier Inc.  

## 2016-02-11 NOTE — Progress Notes (Signed)
Subjective:    Patient ID: [name], female    DOB: 10/22/1930, 82 y.o.   MRN: [id number]  Pt presents to the office today for chronic follow up.  Medication Refill  Associated symptoms include coughing. Pertinent negatives include no chest pain, fatigue, headaches, myalgias or sore throat.  Hypertension  This is a chronic problem. The current episode started more than 1 year ago. The problem has been resolved since onset. The problem is controlled. Pertinent negatives include no chest pain, headaches, malaise/fatigue, palpitations, peripheral edema or shortness of breath. Past treatments include calcium channel blockers and beta blockers. The current treatment provides moderate improvement. Hypertensive end-organ damage includes CAD/MI and heart failure. There is no history of kidney disease, CVA or a thyroid problem. There is no history of sleep apnea.  Hyperlipidemia  This is a chronic problem. The current episode started more than 1 year ago. The problem is uncontrolled. Recent lipid tests were reviewed and are high. Exacerbating diseases include diabetes. She has no history of hypothyroidism. Factors aggravating her hyperlipidemia include fatty foods. Pertinent negatives include no chest pain, leg pain, myalgias or shortness of breath. Current antihyperlipidemic treatment includes diet change. The current treatment provides no improvement of lipids. Risk factors for coronary artery disease include diabetes mellitus, family history, dyslipidemia, hypertension, obesity and post-menopausal.  Asthma  She complains of cough, frequent throat clearing, hoarse voice and wheezing (at times). There is no hemoptysis or shortness of breath. This is a chronic problem. The current episode started more than 1 year ago. The problem occurs intermittently. The problem has been waxing and waning. Pertinent negatives include no chest pain, headaches, malaise/fatigue, myalgias or sore throat. Her symptoms are  alleviated by leukotriene antagonist, rest and ipratropium. She reports moderate improvement on treatment. Her past medical history is significant for asthma.  Gastroesophageal Reflux  She complains of coughing, a hoarse voice and wheezing (at times). She reports no chest pain, no globus sensation, no sore throat or no water brash. This is a chronic problem. The current episode started more than 1 year ago. The problem occurs rarely. The problem has been resolved. Pertinent negatives include no fatigue. She has tried a PPI for the symptoms. The treatment provided significant relief.  Insomnia  Primary symptoms: difficulty falling asleep, frequent awakening, no malaise/fatigue.  The current episode started more than one year. The onset quality is gradual. The problem occurs nightly. The problem has been waxing and waning since onset. The symptoms are relieved by medication. Past treatments include medication. The treatment provided moderate relief.      Review of Systems  Constitutional: Negative.  Negative for fatigue and malaise/fatigue.  HENT: Positive for hoarse voice. Negative for sore throat.   Eyes: Positive for redness.  Respiratory: Positive for cough and wheezing (at times). Negative for hemoptysis and shortness of breath.   Cardiovascular: Negative.  Negative for chest pain and palpitations.  Gastrointestinal: Negative.   Endocrine: Negative.   Genitourinary: Negative.   Musculoskeletal: Negative.  Negative for myalgias.  Neurological: Negative.  Negative for headaches.  Hematological: Negative.   Psychiatric/Behavioral: The patient has insomnia.   All other systems reviewed and are negative.      Objective:   Physical Exam  Constitutional: She is oriented to person, place, and time. She appears well-developed and well-nourished. No distress.  HENT:  Head: Normocephalic and atraumatic.  Right Ear: External ear normal.  Left Ear: External ear normal.  Mouth/Throat:  Oropharynx is clear and moist.  Nasal passage  erythemas with mild swelling, hoarse voice   Eyes: Pupils are equal, round, and reactive to light.  Neck: Normal range of motion. Neck supple. No thyromegaly present.  Cardiovascular: Normal rate, regular rhythm, normal heart sounds and intact distal pulses.   No murmur heard. Pulmonary/Chest: Effort normal. No respiratory distress. She has wheezes (bilateral bases).  Abdominal: Soft. Bowel sounds are normal. She exhibits no distension. There is no tenderness.  Musculoskeletal: Normal range of motion. She exhibits no edema or tenderness.  Neurological: She is alert and oriented to person, place, and time.  Skin: Skin is warm and dry.  Psychiatric: She has a normal mood and affect. Her behavior is normal. Judgment and thought content normal.  Vitals reviewed.     BP 126/62   Pulse 72   Temp 97.1 F (36.2 C) (Oral)   Ht 5' 1"  (1.549 m)   Wt 145 lb 6.4 oz (66 kg)   BMI 27.47 kg/m      Assessment & Plan:  1. Atherosclerosis of native coronary artery of native heart with angina pectoris ([place]) - CMP14+EGFR  2. Essential hypertension, benign - CMP14+EGFR  3. Mild intermittent chronic asthma without complication -PT does not want any inhalers or asthma medications  - CMP14+EGFR  4. Gastroesophageal reflux disease without esophagitis - CMP14+EGFR  5. Chronic pain syndrome - acetaminophen-codeine (TYLENOL #3) 300-30 MG tablet; TAKE 2 TABLETS AT BEDTIME AS NEEDED  Dispense: 60 tablet; Refill: 2 - CMP14+EGFR  6. Moderate episode of recurrent major depressive disorder ([facility]) - CMP14+EGFR  7. Mixed hyperlipidemia - CMP14+EGFR  8. Insomnia, unspecified type - promethazine (PHENERGAN) 25 MG tablet; Take 2 tablets (50 mg total) by mouth at bedtime.  Dispense: 60 tablet; Refill: 2 - CMP14+EGFR  9. Overweight (BMI 25.0-29.9) - CMP14+EGFR   Continue all meds Labs pending Health Maintenance reviewed Diet and exercise  encouraged RTO 3 months  [name], FNP

## 2016-02-12 ENCOUNTER — Ambulatory Visit: Payer: Medicare Other | Admitting: Family

## 2016-02-12 LAB — CMP14+EGFR
ALT: 22 IU/L (ref 0–32)
AST: 20 IU/L (ref 0–40)
Albumin/Globulin Ratio: 1.5 (ref 1.2–2.2)
Albumin: 4 g/dL (ref 3.5–4.7)
Alkaline Phosphatase: 76 IU/L (ref 39–117)
BUN/Creatinine Ratio: 23 (ref 12–28)
BUN: 31 mg/dL — AB (ref 8–27)
CALCIUM: 9.1 mg/dL (ref 8.7–10.3)
CHLORIDE: 91 mmol/L — AB (ref 96–106)
CO2: 22 mmol/L (ref 18–29)
CREATININE: 1.32 mg/dL — AB (ref 0.57–1.00)
GFR, EST AFRICAN AMERICAN: 43 mL/min/{1.73_m2} — AB (ref >59)
GFR, EST NON AFRICAN AMERICAN: 37 mL/min/{1.73_m2} — AB (ref >59)
GLUCOSE: 132 mg/dL — AB (ref 65–99)
Globulin, Total: 2.6 g/dL (ref 1.5–4.5)
Potassium: 4.1 mmol/L (ref 3.5–5.2)
Sodium: 134 mmol/L (ref 134–144)
TOTAL PROTEIN: 6.6 g/dL (ref 6.0–8.5)

## 2016-02-17 ENCOUNTER — Ambulatory Visit: Payer: Medicare Other | Admitting: Family

## 2016-03-07 ENCOUNTER — Ambulatory Visit (INDEPENDENT_AMBULATORY_CARE_PROVIDER_SITE_OTHER): Payer: Medicare Other | Admitting: Family

## 2016-03-07 ENCOUNTER — Encounter: Payer: Self-pay | Admitting: Family

## 2016-03-07 VITALS — BP 144/64 | HR 72 | Temp 98.1°F | Ht 61.0 in | Wt 144.0 lb

## 2016-03-07 DIAGNOSIS — I25119 Atherosclerotic heart disease of native coronary artery with unspecified angina pectoris: Secondary | ICD-10-CM | POA: Diagnosis not present

## 2016-03-07 DIAGNOSIS — N3001 Acute cystitis with hematuria: Secondary | ICD-10-CM

## 2016-03-07 DIAGNOSIS — M5441 Lumbago with sciatica, right side: Secondary | ICD-10-CM | POA: Diagnosis not present

## 2016-03-07 DIAGNOSIS — R8290 Unspecified abnormal findings in urine: Secondary | ICD-10-CM | POA: Diagnosis not present

## 2016-03-07 LAB — URINALYSIS, COMPLETE
Bilirubin, UA: NEGATIVE
Glucose, UA: NEGATIVE
Ketones, UA: NEGATIVE
Nitrite, UA: NEGATIVE
PH UA: 8 — AB (ref 5.0–7.5)
Protein, UA: NEGATIVE
Specific Gravity, UA: 1.005 (ref 1.005–1.030)
Urobilinogen, Ur: 0.2 mg/dL (ref 0.2–1.0)

## 2016-03-07 LAB — MICROSCOPIC EXAMINATION: WBC, UA: >30 /hpf — AB (ref 0–?)

## 2016-03-07 MED ORDER — METHYLPREDNISOLONE ACETATE 40 MG/ML IJ SUSP: 40.0000 mg | Freq: Once | INTRAMUSCULAR | Status: DC

## 2016-03-07 MED ORDER — SULFAMETHOXAZOLE-TRIMETHOPRIM 800-160 MG PO TABS: 1.0000 | ORAL_TABLET | Freq: Two times a day (BID) | ORAL | 0 refills | Status: DC

## 2016-03-07 MED ORDER — METHYLPREDNISOLONE ACETATE 80 MG/ML IJ SUSP
40.0000 mg | Freq: Once | INTRAMUSCULAR | Status: AC
  Administered 2016-03-07: 40 mg via INTRAMUSCULAR

## 2016-03-07 NOTE — Patient Instructions (Signed)
Asymptomatic Bacteriuria, Female Asymptomatic bacteriuria is the presence of a large number of bacteria in your urine without the usual symptoms of burning or frequent urination. The following conditions increase the risk of asymptomatic bacteriuria:  Diabetes mellitus.  Advanced age.  Pregnancy in the first trimester.  Kidney stones.  Kidney transplants.  Leaky kidney tube valve in young children (reflux). Treatment for this condition is not needed in most people and can lead to other problems such as too much yeast and growth of resistant bacteria. However, some people, such as pregnant women, do need treatment to prevent kidney infection. Asymptomatic bacteriuria in pregnancy is also associated with fetal growth restriction, premature labor, and newborn death. HOME CARE INSTRUCTIONS Monitor your condition for any changes. The following actions may help to relieve any discomfort you are feeling:  Drink enough water and fluids to keep your urine clear or pale yellow. Go to the bathroom more often to keep your bladder empty.  Keep the area around your vagina and rectum clean. Wipe yourself from front to back after urinating. SEEK IMMEDIATE MEDICAL CARE IF:  You develop signs of an infection such as:  Burning with urination.  Frequency of voiding.  Back pain.  Fever.  You have blood in the urine.  You develop a fever. MAKE SURE YOU:  Understand these instructions.  Will watch your condition.  Will get help right away if you are not doing well or get worse.   This information is not intended to replace advice given to you by your health care provider. Make sure you discuss any questions you have with your health care provider.   Document Released: 04/11/2005 Document Revised: 05/02/2014 Document Reviewed: 10/01/2012 Elsevier Interactive Patient Education 2016 Elsevier Inc.  

## 2016-03-07 NOTE — Addendum Note (Signed)
Addended by: [name] A on: 03/07/2016 02:56 PM   Modules accepted: Orders

## 2016-03-07 NOTE — Progress Notes (Addendum)
   Subjective:    Patient ID: [name], female    DOB: 06-04-1930, 82 y.o.   MRN: [id number]  Back Pain  This is a recurrent problem. The current episode started in the past 7 days. The problem occurs constantly. The problem has been waxing and waning since onset. The pain is present in the lumbar spine. The quality of the pain is described as burning and aching. The pain radiates to the right thigh. The pain is at a severity of 8/10. The pain is moderate. The symptoms are aggravated by bending. Pertinent negatives include no bladder incontinence, bowel incontinence or dysuria. (Frequency, cloudy urine ) She has tried bed rest for the symptoms. The treatment provided mild relief.      Review of Systems  Gastrointestinal: Negative for bowel incontinence.  Genitourinary: Negative for bladder incontinence and dysuria.  Musculoskeletal: Positive for back pain.  All other systems reviewed and are negative.      Objective:   Physical Exam  Constitutional: She is oriented to person, place, and time. She appears well-developed and well-nourished. No distress.  HENT:  Head: Normocephalic and atraumatic.  Eyes: Pupils are equal, round, and reactive to light.  Neck: Normal range of motion. Neck supple. No thyromegaly present.  Cardiovascular: Normal rate, regular rhythm, normal heart sounds and intact distal pulses.   No murmur heard. Pulmonary/Chest: Effort normal and breath sounds normal. No respiratory distress. She has no wheezes.  Abdominal: Soft. Bowel sounds are normal. She exhibits no distension. There is no tenderness.  Musculoskeletal: Normal range of motion. She exhibits tenderness. She exhibits no edema.  Neurological: She is alert and oriented to person, place, and time.  Skin: Skin is warm and dry.  Psychiatric: She has a normal mood and affect. Her behavior is normal. Judgment and thought content normal.  Vitals reviewed.     BP (!) 144/64   Pulse 72   Temp 98.1 F  (36.7 C) (Oral)   Ht 5\' 1"  (1.549 m)   Wt 144 lb (65.3 kg)   BMI 27.21 kg/m      Assessment & Plan:  1. Cloudy urine - Urinalysis, Complete  2. Acute cystitis with hematuria -Force fluids AZO over the counter X2 days RTO prn Culture pending - sulfamethoxazole-trimethoprim (BACTRIM DS) 800-160 MG tablet; Take 1 tablet by mouth 2 (two) times daily.  Dispense: 14 tablet; Refill: 0 - Urine culture  3. Acute right-sided low back pain with right-sided sciatica -Rest -Ice and heat -RTO prn  - methylPREDNISolone acetate (DEPO-MEDROL) injection 40 mg; Inject 1 mL (40 mg total) into the muscle once.   [name], FNP

## 2016-03-09 LAB — URINE CULTURE

## 2016-03-27 ENCOUNTER — Other Ambulatory Visit: Payer: Self-pay | Admitting: Family

## 2016-04-21 ENCOUNTER — Telehealth: Payer: Self-pay | Admitting: Family

## 2016-04-21 NOTE — Telephone Encounter (Signed)
Pt is moving to [facility] Needed all meds refilled appt scheduled Pt notified

## 2016-04-22 ENCOUNTER — Ambulatory Visit (INDEPENDENT_AMBULATORY_CARE_PROVIDER_SITE_OTHER): Payer: Medicare Other | Admitting: Family

## 2016-04-22 VITALS — BP 110/58 | HR 62 | Temp 98.0°F | Ht 61.0 in | Wt 141.6 lb

## 2016-04-22 DIAGNOSIS — G894 Chronic pain syndrome: Secondary | ICD-10-CM

## 2016-04-22 DIAGNOSIS — F331 Major depressive disorder, recurrent, moderate: Secondary | ICD-10-CM

## 2016-04-22 DIAGNOSIS — J4520 Mild intermittent asthma, uncomplicated: Secondary | ICD-10-CM

## 2016-04-22 DIAGNOSIS — E782 Mixed hyperlipidemia: Secondary | ICD-10-CM

## 2016-04-22 DIAGNOSIS — I10 Essential (primary) hypertension: Secondary | ICD-10-CM

## 2016-04-22 DIAGNOSIS — E663 Overweight: Secondary | ICD-10-CM | POA: Diagnosis not present

## 2016-04-22 DIAGNOSIS — K219 Gastro-esophageal reflux disease without esophagitis: Secondary | ICD-10-CM | POA: Diagnosis not present

## 2016-04-22 DIAGNOSIS — I25119 Atherosclerotic heart disease of native coronary artery with unspecified angina pectoris: Secondary | ICD-10-CM

## 2016-04-22 DIAGNOSIS — G4700 Insomnia, unspecified: Secondary | ICD-10-CM | POA: Diagnosis not present

## 2016-04-22 LAB — CMP14+EGFR
ALBUMIN: 4 g/dL (ref 3.5–4.7)
ALK PHOS: 64 IU/L (ref 39–117)
ALT: 17 IU/L (ref 0–32)
AST: 18 IU/L (ref 0–40)
Albumin/Globulin Ratio: 1.6 (ref 1.2–2.2)
BUN/Creatinine Ratio: 27 (ref 12–28)
BUN: 30 mg/dL — AB (ref 8–27)
Bilirubin Total: 0.3 mg/dL (ref 0.0–1.2)
CO2: 21 mmol/L (ref 18–29)
CREATININE: 1.11 mg/dL — AB (ref 0.57–1.00)
Calcium: 9.7 mg/dL (ref 8.7–10.3)
Chloride: 94 mmol/L — ABNORMAL LOW (ref 96–106)
GFR calc Af Amer: 53 mL/min/{1.73_m2} — ABNORMAL LOW (ref >59)
GFR calc non Af Amer: 46 mL/min/{1.73_m2} — ABNORMAL LOW (ref >59)
GLUCOSE: 91 mg/dL (ref 65–99)
Globulin, Total: 2.5 g/dL (ref 1.5–4.5)
Potassium: 4.8 mmol/L (ref 3.5–5.2)
Sodium: 133 mmol/L — ABNORMAL LOW (ref 134–144)
Total Protein: 6.5 g/dL (ref 6.0–8.5)

## 2016-04-22 MED ORDER — TRIAMTERENE-HCTZ 37.5-25 MG PO TABS: 1.0000 | ORAL_TABLET | Freq: Every day | ORAL | 1 refills | Status: DC

## 2016-04-22 MED ORDER — CITALOPRAM HYDROBROMIDE 20 MG PO TABS: ORAL_TABLET | ORAL | 2 refills | Status: DC

## 2016-04-22 MED ORDER — POTASSIUM CHLORIDE ER 10 MEQ PO CPCR: ORAL_CAPSULE | ORAL | 2 refills | Status: AC

## 2016-04-22 MED ORDER — PROMETHAZINE HCL 25 MG PO TABS: 50.0000 mg | ORAL_TABLET | Freq: Every day | ORAL | 2 refills | Status: DC

## 2016-04-22 MED ORDER — METOPROLOL SUCCINATE ER 50 MG PO TB24: ORAL_TABLET | ORAL | 2 refills | Status: AC

## 2016-04-22 MED ORDER — MONTELUKAST SODIUM 10 MG PO TABS: ORAL_TABLET | ORAL | 3 refills | Status: DC

## 2016-04-22 MED ORDER — AMLODIPINE BESYLATE 10 MG PO TABS: 10.0000 mg | ORAL_TABLET | Freq: Every day | ORAL | 2 refills | Status: DC

## 2016-04-22 MED ORDER — ACETAMINOPHEN-CODEINE #3 300-30 MG PO TABS: ORAL_TABLET | ORAL | 2 refills | Status: DC

## 2016-04-22 MED ORDER — OMEPRAZOLE 20 MG PO CPDR: DELAYED_RELEASE_CAPSULE | ORAL | 1 refills | Status: DC

## 2016-04-22 MED ORDER — ISOSORBIDE MONONITRATE ER 60 MG PO TB24: ORAL_TABLET | ORAL | 2 refills | Status: AC

## 2016-04-22 NOTE — Progress Notes (Signed)
Subjective:    Patient ID: [name], female    DOB: Apr 13, 1931, 82 y.o.   MRN: [id number]  Pt presents to the office today for chronic follow up. Pt is moving next month to [facility] and will find a new PCP closer to that area. Pt requesting all of her mediations be refilled today.  Hypertension  This is a chronic problem. The current episode started more than 1 year ago. The problem has been resolved since onset. The problem is controlled. Pertinent negatives include no malaise/fatigue, palpitations, peripheral edema or shortness of breath. Past treatments include calcium channel blockers and beta blockers. The current treatment provides moderate improvement. Hypertensive end-organ damage includes CAD/MI and heart failure. There is no history of kidney disease, CVA or a thyroid problem. There is no history of sleep apnea.  Gastroesophageal Reflux  She complains of a hoarse voice. She reports no globus sensation, no water brash or no wheezing. This is a chronic problem. The current episode started more than 1 year ago. The problem occurs rarely. The problem has been resolved. She has tried a PPI for the symptoms. The treatment provided significant relief.  Hyperlipidemia  This is a chronic problem. The current episode started more than 1 year ago. The problem is uncontrolled. Recent lipid tests were reviewed and are high. Exacerbating diseases include diabetes. She has no history of hypothyroidism. Factors aggravating her hyperlipidemia include fatty foods. Pertinent negatives include no leg pain or shortness of breath. Current antihyperlipidemic treatment includes diet change. The current treatment provides no improvement of lipids. Risk factors for coronary artery disease include diabetes mellitus, family history, dyslipidemia, hypertension, obesity and post-menopausal.  Asthma  She complains of frequent throat clearing and hoarse voice. There is no hemoptysis, shortness of breath or  wheezing. This is a chronic problem. The current episode started more than 1 year ago. The problem occurs intermittently. The problem has been waxing and waning. Pertinent negatives include no malaise/fatigue. Her symptoms are alleviated by leukotriene antagonist, rest and ipratropium. She reports moderate improvement on treatment. Her past medical history is significant for asthma.  Insomnia  Primary symptoms: difficulty falling asleep, frequent awakening, no malaise/fatigue.  The current episode started more than one year. The onset quality is gradual. The problem occurs nightly. The problem has been waxing and waning since onset. The symptoms are relieved by medication. Past treatments include medication. The treatment provided moderate relief.  Arthritis  Presents for follow-up visit. She complains of pain and stiffness. Her pain is at a severity of 7/10. Associated symptoms include pain at night.      Review of Systems  Constitutional: Negative.  Negative for malaise/fatigue.  HENT: Positive for hoarse voice.   Eyes: Positive for redness.  Respiratory: Negative for hemoptysis, shortness of breath and wheezing.   Cardiovascular: Negative.  Negative for palpitations.  Gastrointestinal: Negative.   Endocrine: Negative.   Genitourinary: Negative.   Musculoskeletal: Positive for arthritis and stiffness.  Neurological: Negative.   Hematological: Negative.   Psychiatric/Behavioral: The patient has insomnia.   All other systems reviewed and are negative.      Objective:   Physical Exam  Constitutional: She is oriented to person, place, and time. She appears well-developed and well-nourished. No distress.  HENT:  Head: Normocephalic and atraumatic.  Right Ear: External ear normal.  Left Ear: External ear normal.  Nose: Mucosal edema present.  Mouth/Throat: Oropharynx is clear and moist.  Nasal passage erythemas with mild swelling, hoarse voice   Eyes: Pupils are  equal, round, and  reactive to light.  Neck: Normal range of motion. Neck supple. No thyromegaly present.  Cardiovascular: Normal rate, regular rhythm, normal heart sounds and intact distal pulses.   No murmur heard. Pulmonary/Chest: Effort normal. No respiratory distress. She has no wheezes.  Abdominal: Soft. Bowel sounds are normal. She exhibits no distension. There is no tenderness.  Musculoskeletal: Normal range of motion. She exhibits no edema or tenderness.  Neurological: She is alert and oriented to person, place, and time.  Skin: Skin is warm and dry.  Psychiatric: She has a normal mood and affect. Her behavior is normal. Judgment and thought content normal.  Vitals reviewed.     BP (!) 110/58   Pulse 62   Temp 98 F (36.7 C) (Oral)   Ht 5' 1"  (1.549 m)   Wt 141 lb 9.6 oz (64.2 kg)   BMI 26.76 kg/m      Assessment & Plan:  1. Atherosclerosis of native coronary artery of native heart with angina pectoris ([place]) - CMP14+EGFR  2. Essential hypertension, benign -Will decrease Maxzide from 75-56m to 37.5-25mg today - CMP14+EGFR  3. Mild intermittent chronic asthma without complication - CZOX09+UEAV 4. Gastroesophageal reflux disease without esophagitis - CMP14+EGFR  5. Chronic pain syndrome - CMP14+EGFR - acetaminophen-codeine (TYLENOL #3) 300-30 MG tablet; TAKE 2 TABLETS AT BEDTIME AS NEEDED  Dispense: 60 tablet; Refill: 2  6. Moderate episode of recurrent major depressive disorder (HCC) - CMP14+EGFR  7. Mixed hyperlipidemia - CMP14+EGFR  8. Overweight (BMI 25.0-29.9) - CMP14+EGFR  9. Insomnia, unspecified type - CMP14+EGFR - promethazine (PHENERGAN) 25 MG tablet; Take 2 tablets (50 mg total) by mouth at bedtime.  Dispense: 60 tablet; Refill: 2   Continue all meds Labs pending Health Maintenance reviewed Diet and exercise encouraged RTO 3 months  CEvelina Dun FNP

## 2016-04-22 NOTE — Patient Instructions (Signed)

## 2016-04-23 ENCOUNTER — Other Ambulatory Visit: Payer: Self-pay | Admitting: Family

## 2016-05-10 DIAGNOSIS — M25562 Pain in left knee: Secondary | ICD-10-CM | POA: Diagnosis not present

## 2016-05-10 DIAGNOSIS — M25512 Pain in left shoulder: Secondary | ICD-10-CM | POA: Diagnosis not present

## 2016-05-12 ENCOUNTER — Ambulatory Visit (HOSPITAL_COMMUNITY)
Admission: EM | Admit: 2016-05-12 | Discharge: 2016-05-12 | Disposition: A | Discharge status: Home / Self Care | Payer: Medicare Other | Attending: Emergency Medicine | Admitting: Emergency Medicine

## 2016-05-12 ENCOUNTER — Ambulatory Visit (INDEPENDENT_AMBULATORY_CARE_PROVIDER_SITE_OTHER): Payer: Medicare Other

## 2016-05-12 ENCOUNTER — Encounter (HOSPITAL_COMMUNITY): Payer: Self-pay | Admitting: Emergency Medicine

## 2016-05-12 DIAGNOSIS — B9789 Other viral agents as the cause of diseases classified elsewhere: Secondary | ICD-10-CM

## 2016-05-12 DIAGNOSIS — J069 Acute upper respiratory infection, unspecified: Secondary | ICD-10-CM

## 2016-05-12 DIAGNOSIS — M1712 Unilateral primary osteoarthritis, left knee: Secondary | ICD-10-CM | POA: Diagnosis not present

## 2016-05-12 DIAGNOSIS — M25512 Pain in left shoulder: Secondary | ICD-10-CM

## 2016-05-12 DIAGNOSIS — M12812 Other specific arthropathies, not elsewhere classified, left shoulder: Secondary | ICD-10-CM | POA: Diagnosis not present

## 2016-05-12 DIAGNOSIS — R05 Cough: Secondary | ICD-10-CM | POA: Diagnosis not present

## 2016-05-12 DIAGNOSIS — M25511 Pain in right shoulder: Secondary | ICD-10-CM | POA: Diagnosis not present

## 2016-05-12 DIAGNOSIS — M75102 Unspecified rotator cuff tear or rupture of left shoulder, not specified as traumatic: Secondary | ICD-10-CM

## 2016-05-12 DIAGNOSIS — M25562 Pain in left knee: Secondary | ICD-10-CM | POA: Diagnosis not present

## 2016-05-12 MED ORDER — BENZONATATE 100 MG PO CAPS: 100.0000 mg | ORAL_CAPSULE | Freq: Three times a day (TID) | ORAL | 0 refills | Status: DC

## 2016-05-12 MED ORDER — AZITHROMYCIN 250 MG PO TABS: 250.0000 mg | ORAL_TABLET | Freq: Every day | ORAL | 0 refills | Status: DC

## 2016-05-12 MED ORDER — HYDROCODONE-ACETAMINOPHEN 5-325 MG PO TABS: 1.0000 | ORAL_TABLET | ORAL | 0 refills | Status: DC | PRN

## 2016-05-12 NOTE — ED Provider Notes (Signed)
CSN: [id number]     Arrival date & time 05/12/16  1657 History   First MD Initiated Contact with Patient 05/12/16 1747     Chief Complaint  Patient presents with  . Arm Pain  . URI   (Consider location/radiation/quality/duration/timing/severity/associated sxs/prior Treatment) Patient c/o left knee pain and left shoulder pain for 3 weeks.  She was taking tylenol #3 and this was DC'd 3 weeks ago and she has been c/o left arm and left knee pain.  She has also had a cough and uri sx's.  She was seen in UC yesterday and doctor told patient daughter to take her to orthopedic office.  She called orthopedic office and was told to come here.   The history is provided by the patient.  Arm Pain  This is a new problem. The problem occurs constantly. The problem has not changed since onset.Nothing aggravates the symptoms. Nothing relieves the symptoms. She has tried nothing for the symptoms.  URI  Presenting symptoms: cough     Past Medical History:  Diagnosis Date  . Asthma   . CAD (coronary artery disease)   . Carotid stenosis    40-59% bilateral  . Chronic pain syndrome   . GERD (gastroesophageal reflux disease)   . HLD (hyperlipidemia)   . HTN (hypertension)   . Skin cancer    Past Surgical History:  Procedure Laterality Date  . ABDOMINAL HYSTERECTOMY  1974  . baldder resuspension  1988  . CORONARY ARTERY BYPASS GRAFT  1996   LIMA to LAD, SVG to PDA and SVG to D1   . CORONARY ARTERY BYPASS GRAFT  2010   cath 2 vessel CAD with 2 of 3 bypass grafts occluded. had a patent LIMA to LAD  . skin cancer resected    . TONSILLECTOMY     Family History  Problem Relation Age of Onset  . Heart disease Brother     multiple; ealry onset  . Heart disease Sister     multiple; early onset   . Breast cancer Sister   . Colon cancer Sister   . Emphysema Daughter    Social History  Substance Use Topics  . Smoking status: Former Smoker    Packs/day: 2.00    Years: 20.00    Types: Cigarettes     Quit date: 10/25/1977  . Smokeless tobacco: Never Used     Comment: smoked 2ppd for 15 years; quit in 1979  . Alcohol use No   OB History    No data available     Review of Systems  Constitutional: Negative.   HENT: Negative.   Eyes: Negative.   Respiratory: Positive for cough.   Cardiovascular: Negative.   Gastrointestinal: Negative.   Endocrine: Negative.   Genitourinary: Negative.   Musculoskeletal: Positive for joint swelling.  Allergic/Immunologic: Negative.   Neurological: Negative.   Hematological: Negative.     Allergies  Lisinopril; Lunesta [eszopiclone]; Simvastatin; Statins; and Ultram [tramadol]  Home Medications   Prior to Admission medications   Medication Sig Start Date End Date Taking? Authorizing Provider  acetaminophen-codeine (TYLENOL #3) 300-30 MG tablet TAKE 2 TABLETS AT BEDTIME AS NEEDED 04/22/16  Yes Sharion Balloon, FNP  amLODipine (NORVASC) 10 MG tablet Take 1 tablet (10 mg total) by mouth daily. 04/22/16  Yes Sharion Balloon, FNP  Calcium Carbonate-Vitamin D (CALCIUM 600/VITAMIN D) 600-400 MG-UNIT per tablet Take 1 tablet by mouth 2 (two) times daily.     Yes Historical Provider, MD  citalopram (CELEXA) 20 MG  tablet TAKE 1 TABLET (20 MG TOTAL) BY MOUTH DAILY. 04/22/16  Yes Sharion Balloon, FNP  fluticasone (FLONASE) 50 MCG/ACT nasal spray PLACE 2 SPRAYS INTO THE NOSE DAILY 04/24/16  Yes Sharion Balloon, FNP  isosorbide mononitrate (IMDUR) 60 MG 24 hr tablet TAKE 1 TABLET (60 MG TOTAL) BY MOUTH DAILY. 04/22/16  Yes Sharion Balloon, FNP  loratadine (CLARITIN) 10 MG tablet Take 10 mg by mouth daily.     Yes Historical Provider, MD  Melatonin-Pyridoxine (MELATIN) 3-1 MG TABS Take 2 tablets by mouth at bedtime.     Yes Historical Provider, MD  metoprolol succinate (TOPROL-XL) 50 MG 24 hr tablet TAKE 1 TABLET (50 MG TOTAL) BY MOUTH DAILY. 04/22/16  Yes Sharion Balloon, FNP  Multiple Vitamin (MULTIVITAMIN) tablet Take 1 tablet by mouth 2 (two) times daily.     Yes Historical Provider, MD  Omega-3 Fatty Acids (FISH OIL) 1000 MG CAPS Take 2 capsules by mouth 2 (two) times daily.     Yes Historical Provider, MD  omeprazole (PRILOSEC) 20 MG capsule TAKE 1 CAPSULE (20 MG TOTAL) BY MOUTH 2 (TWO) TIMES DAILY BEFORE A MEAL. 04/22/16  Yes Sharion Balloon, FNP  potassium chloride (MICRO-K) 10 MEQ CR capsule TAKE 1 CAPSULE (10 MEQ TOTAL) BY MOUTH DAILY. 04/22/16  Yes Sharion Balloon, FNP  triamterene-hydrochlorothiazide (MAXZIDE) 75-50 MG tablet TAKE 1 TABLET BY MOUTH DAILY. 04/24/16  Yes Sharion Balloon, FNP  triamterene-hydrochlorothiazide (MAXZIDE-25) 37.5-25 MG tablet Take 1 tablet by mouth daily. 04/22/16  Yes Sharion Balloon, FNP  vitamin C (ASCORBIC ACID) 500 MG tablet Take 1,000 mg by mouth 2 (two) times daily.     Yes Historical Provider, MD  azithromycin (ZITHROMAX) 250 MG tablet Take 1 tablet (250 mg total) by mouth daily. Take first 2 tablets together, then 1 every day until finished. 05/12/16   Lysbeth Penner, FNP  benzonatate (TESSALON) 100 MG capsule Take 1 capsule (100 mg total) by mouth every 8 (eight) hours. 05/12/16   Lysbeth Penner, FNP  HYDROcodone-acetaminophen (NORCO/VICODIN) 5-325 MG tablet Take 1 tablet by mouth every 4 (four) hours as needed. 05/12/16   Lysbeth Penner, FNP  montelukast (SINGULAIR) 10 MG tablet TAKE 1 TABLET (10 MG TOTAL) BY MOUTH AT BEDTIME. 04/22/16   Sharion Balloon, FNP  promethazine (PHENERGAN) 25 MG tablet Take 2 tablets (50 mg total) by mouth at bedtime. 04/22/16   Sharion Balloon, FNP   Meds Ordered and Administered this Visit  Medications - No data to display  BP 138/57 (BP Location: Right Arm)   Pulse 85   Temp 98.6 F (37 C) (Oral)   Resp 16   SpO2 95%  No data found.   Physical Exam  Constitutional: She appears well-developed and well-nourished.  HENT:  Head: Normocephalic and atraumatic.  Eyes: Conjunctivae and EOM are normal. Pupils are equal, round, and reactive to light.  Neck: Normal range  of motion. Neck supple.  Cardiovascular: Normal rate, regular rhythm and normal heart sounds.   Pulmonary/Chest: Effort normal. She has rales.  Musculoskeletal: She exhibits tenderness.  Swollen and painful left shoulder and unable to touch right shoulder with left hand.  Nursing note and vitals reviewed.   Urgent Care Course     Procedures (including critical care time)  Labs Review Labs Reviewed - No data to display  Imaging Review Dg Chest 2 View  Result Date: 05/12/2016 CLINICAL DATA:  Nonproductive cough. EXAM: CHEST  2 VIEW COMPARISON:  August 21, 2012 FINDINGS: Interval increase in coarsened interstitial markings consistent with progression of the suspected interstitial lung disease seen on not high contrast CT of the chest from July 2014. No nodules or masses. No focal infiltrates. The cardiomediastinal silhouette is stable. No other acute abnormalities. 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 IMPRESSION: Increased coarsening of the bilateral lung interstitium suggests worsening of the interstitial lung disease seen in July 2014. It would be difficult to completely exclude a subtle superimposed infectious process but no definitive focal infiltrate is identified. Electronically Signed   By: Dorise Bullion III M.D   On: 05/12/2016 19:08   Dg Shoulder Left  Result Date: 05/12/2016 CLINICAL DATA:  Right shoulder pain. History of fall 8 months ago. Initial encounter. EXAM: LEFT SHOULDER - 2+ VIEW COMPARISON:  None. FINDINGS: No acute bony or joint abnormality is identified. The humeral head is high-riding compatible with chronic rotator cuff tear. Several small loose bodies are identified subjacent to the coracoid process. There is mild acromioclavicular osteoarthritis. Prominent soft tissue attenuation above the acromion measures 2.6 cm transverse by 1.4 cm craniocaudal and is compatible with fluid. IMPRESSION: No acute abnormality. High-riding humeral head  consistent with chronic rotator cuff tear. Subcutaneous soft tissue attenuation superior and lateral to the acromion is likely fluid which may have extravasated through the patient's rotator cuff tear could be due to adventitial bursitis. Nonemergent MRI could be used for further evaluation. Electronically Signed   By: Inge Rise M.D.   On: 05/12/2016 19:05   Dg Knee Ap/lat W/sunrise Left  Result Date: 05/12/2016 CLINICAL DATA:  Pain for 1 month. EXAM: LEFT KNEE 3 VIEWS COMPARISON:  None. FINDINGS: No evidence of fracture, dislocation, or joint effusion. No evidence of arthropathy or other focal bone abnormality. Soft tissues are unremarkable. IMPRESSION: Negative. Electronically Signed   By: Dorise Bullion III M.D   On: 05/12/2016 19:05     Visual Acuity Review  Right Eye Distance:   Left Eye Distance:   Bilateral Distance:    Right Eye Near:   Left Eye Near:    Bilateral Near:         MDM   1. Left shoulder pain, unspecified chronicity   2. Rotator cuff tear arthropathy, left   3. Arthritis of left knee   4. Viral upper respiratory tract infection    Norco 5/325 one po q 6 hours prn #20 Refer to [place], FNP 05/13/16 [place], FNP 05/13/16 1026

## 2016-05-12 NOTE — ED Triage Notes (Signed)
Here for left arm pain onset 3 weeks... Reports pain flared up again when she stopped taking tye #3  Went to another [facility] and was dx w/osteoarthritis and nothing was done.   Denies inj/trauma  Also c/o cold sx onset 2 days associated w/dry cough, nasal drainage/congestin  Denies fevers  A&O x4... NAD... Daughter here w/pt.

## 2016-05-15 ENCOUNTER — Inpatient Hospital Stay (HOSPITAL_COMMUNITY)
Admission: EM | Admit: 2016-05-15 | Discharge: 2016-05-20 | DRG: 193 | Disposition: A | Discharge status: Home Health | Payer: Medicare Other | Attending: Internal Medicine | Admitting: Internal Medicine

## 2016-05-15 ENCOUNTER — Encounter (HOSPITAL_COMMUNITY): Payer: Self-pay | Admitting: Emergency Medicine

## 2016-05-15 ENCOUNTER — Ambulatory Visit (INDEPENDENT_AMBULATORY_CARE_PROVIDER_SITE_OTHER)
Admission: EM | Admit: 2016-05-15 | Discharge: 2016-05-15 | Disposition: A | Discharge status: Nursing Home (Includes ICF) | Payer: Medicare Other | Source: Home / Self Care | Attending: Emergency Medicine | Admitting: Emergency Medicine

## 2016-05-15 ENCOUNTER — Emergency Department (HOSPITAL_COMMUNITY): Payer: Medicare Other

## 2016-05-15 ENCOUNTER — Encounter (HOSPITAL_COMMUNITY): Payer: Self-pay | Admitting: *Deleted

## 2016-05-15 DIAGNOSIS — Z7982 Long term (current) use of aspirin: Secondary | ICD-10-CM | POA: Diagnosis not present

## 2016-05-15 DIAGNOSIS — M12812 Other specific arthropathies, not elsewhere classified, left shoulder: Secondary | ICD-10-CM | POA: Diagnosis not present

## 2016-05-15 DIAGNOSIS — M25512 Pain in left shoulder: Secondary | ICD-10-CM | POA: Diagnosis not present

## 2016-05-15 DIAGNOSIS — I21A1 Myocardial infarction type 2: Secondary | ICD-10-CM | POA: Diagnosis present

## 2016-05-15 DIAGNOSIS — J40 Bronchitis, not specified as acute or chronic: Secondary | ICD-10-CM

## 2016-05-15 DIAGNOSIS — I2510 Atherosclerotic heart disease of native coronary artery without angina pectoris: Secondary | ICD-10-CM | POA: Diagnosis present

## 2016-05-15 DIAGNOSIS — Z888 Allergy status to other drugs, medicaments and biological substances status: Secondary | ICD-10-CM | POA: Diagnosis not present

## 2016-05-15 DIAGNOSIS — J9601 Acute respiratory failure with hypoxia: Secondary | ICD-10-CM | POA: Diagnosis present

## 2016-05-15 DIAGNOSIS — J101 Influenza due to other identified influenza virus with other respiratory manifestations: Secondary | ICD-10-CM | POA: Diagnosis not present

## 2016-05-15 DIAGNOSIS — Z8249 Family history of ischemic heart disease and other diseases of the circulatory system: Secondary | ICD-10-CM | POA: Diagnosis not present

## 2016-05-15 DIAGNOSIS — J984 Other disorders of lung: Secondary | ICD-10-CM | POA: Diagnosis not present

## 2016-05-15 DIAGNOSIS — R0602 Shortness of breath: Secondary | ICD-10-CM

## 2016-05-15 DIAGNOSIS — E871 Hypo-osmolality and hyponatremia: Secondary | ICD-10-CM | POA: Diagnosis present

## 2016-05-15 DIAGNOSIS — J4520 Mild intermittent asthma, uncomplicated: Secondary | ICD-10-CM | POA: Diagnosis not present

## 2016-05-15 DIAGNOSIS — Z87891 Personal history of nicotine dependence: Secondary | ICD-10-CM

## 2016-05-15 DIAGNOSIS — M75100 Unspecified rotator cuff tear or rupture of unspecified shoulder, not specified as traumatic: Secondary | ICD-10-CM

## 2016-05-15 DIAGNOSIS — J209 Acute bronchitis, unspecified: Secondary | ICD-10-CM | POA: Diagnosis present

## 2016-05-15 DIAGNOSIS — J440 Chronic obstructive pulmonary disease with acute lower respiratory infection: Secondary | ICD-10-CM | POA: Diagnosis present

## 2016-05-15 DIAGNOSIS — E785 Hyperlipidemia, unspecified: Secondary | ICD-10-CM | POA: Diagnosis present

## 2016-05-15 DIAGNOSIS — E222 Syndrome of inappropriate secretion of antidiuretic hormone: Secondary | ICD-10-CM | POA: Diagnosis present

## 2016-05-15 DIAGNOSIS — Z96619 Presence of unspecified artificial shoulder joint: Secondary | ICD-10-CM | POA: Diagnosis present

## 2016-05-15 DIAGNOSIS — J9801 Acute bronchospasm: Secondary | ICD-10-CM | POA: Diagnosis not present

## 2016-05-15 DIAGNOSIS — I10 Essential (primary) hypertension: Secondary | ICD-10-CM | POA: Diagnosis present

## 2016-05-15 DIAGNOSIS — J9611 Chronic respiratory failure with hypoxia: Secondary | ICD-10-CM | POA: Diagnosis present

## 2016-05-15 DIAGNOSIS — J45909 Unspecified asthma, uncomplicated: Secondary | ICD-10-CM | POA: Diagnosis present

## 2016-05-15 DIAGNOSIS — S43402A Unspecified sprain of left shoulder joint, initial encounter: Secondary | ICD-10-CM | POA: Diagnosis not present

## 2016-05-15 DIAGNOSIS — J849 Interstitial pulmonary disease, unspecified: Secondary | ICD-10-CM | POA: Diagnosis not present

## 2016-05-15 DIAGNOSIS — M75102 Unspecified rotator cuff tear or rupture of left shoulder, not specified as traumatic: Secondary | ICD-10-CM | POA: Diagnosis present

## 2016-05-15 DIAGNOSIS — Z85828 Personal history of other malignant neoplasm of skin: Secondary | ICD-10-CM | POA: Diagnosis not present

## 2016-05-15 DIAGNOSIS — R069 Unspecified abnormalities of breathing: Secondary | ICD-10-CM | POA: Diagnosis not present

## 2016-05-15 DIAGNOSIS — J441 Chronic obstructive pulmonary disease with (acute) exacerbation: Secondary | ICD-10-CM

## 2016-05-15 DIAGNOSIS — K219 Gastro-esophageal reflux disease without esophagitis: Secondary | ICD-10-CM | POA: Diagnosis present

## 2016-05-15 DIAGNOSIS — Z951 Presence of aortocoronary bypass graft: Secondary | ICD-10-CM

## 2016-05-15 DIAGNOSIS — G894 Chronic pain syndrome: Secondary | ICD-10-CM | POA: Diagnosis present

## 2016-05-15 DIAGNOSIS — J9621 Acute and chronic respiratory failure with hypoxia: Secondary | ICD-10-CM | POA: Diagnosis present

## 2016-05-15 DIAGNOSIS — I214 Non-ST elevation (NSTEMI) myocardial infarction: Secondary | ICD-10-CM | POA: Diagnosis not present

## 2016-05-15 LAB — CBC WITH DIFFERENTIAL/PLATELET
BASOS ABS: 0 10*3/uL (ref 0.0–0.1)
BASOS PCT: 0 %
EOS ABS: 0 10*3/uL (ref 0.0–0.7)
Eosinophils Relative: 0 %
HCT: 35.5 % — ABNORMAL LOW (ref 36.0–46.0)
Hemoglobin: 12.8 g/dL (ref 12.0–15.0)
LYMPHS PCT: 8 %
Lymphs Abs: 0.5 10*3/uL — ABNORMAL LOW (ref 0.7–4.0)
MCH: 30.8 pg (ref 26.0–34.0)
MCHC: 36.1 g/dL — ABNORMAL HIGH (ref 30.0–36.0)
MCV: 85.5 fL (ref 78.0–100.0)
Monocytes Absolute: 0.4 10*3/uL (ref 0.1–1.0)
Monocytes Relative: 6 %
Neutro Abs: 5.6 10*3/uL (ref 1.7–7.7)
Neutrophils Relative %: 86 %
PLATELETS: 201 10*3/uL (ref 150–400)
RBC: 4.15 MIL/uL (ref 3.87–5.11)
RDW: 13 % (ref 11.5–15.5)
WBC: 6.5 10*3/uL (ref 4.0–10.5)

## 2016-05-15 LAB — BASIC METABOLIC PANEL
ANION GAP: 12 (ref 5–15)
ANION GAP: 10 (ref 5–15)
BUN: 16 mg/dL (ref 6–20)
BUN: 17 mg/dL (ref 6–20)
CALCIUM: 8.6 mg/dL — AB (ref 8.9–10.3)
CALCIUM: 8.7 mg/dL — AB (ref 8.9–10.3)
CO2: 26 mmol/L (ref 22–32)
CO2: 26 mmol/L (ref 22–32)
CREATININE: 0.78 mg/dL (ref 0.44–1.00)
Chloride: 82 mmol/L — ABNORMAL LOW (ref 101–111)
Chloride: 83 mmol/L — ABNORMAL LOW (ref 101–111)
Creatinine, Ser: 0.67 mg/dL (ref 0.44–1.00)
Glucose, Bld: 117 mg/dL — ABNORMAL HIGH (ref 65–99)
Glucose, Bld: 255 mg/dL — ABNORMAL HIGH (ref 65–99)
POTASSIUM: 3 mmol/L — AB (ref 3.5–5.1)
Potassium: 3.2 mmol/L — ABNORMAL LOW (ref 3.5–5.1)
SODIUM: 120 mmol/L — AB (ref 135–145)
Sodium: 119 mmol/L — CL (ref 135–145)

## 2016-05-15 LAB — BRAIN NATRIURETIC PEPTIDE: B NATRIURETIC PEPTIDE 5: 129.2 pg/mL — AB (ref 0.0–100.0)

## 2016-05-15 LAB — TROPONIN I: Troponin I: 0.07 ng/mL (ref <0.03)

## 2016-05-15 MED ORDER — LORATADINE 10 MG PO TABS
10.0000 mg | ORAL_TABLET | Freq: Every day | ORAL | Status: DC
  Administered 2016-05-16 – 2016-05-20 (×5): 10 mg via ORAL
  Filled 2016-05-15 (×5): qty 1

## 2016-05-15 MED ORDER — SODIUM CHLORIDE 0.9 % IJ SOLN
INTRAMUSCULAR | Status: AC
  Filled 2016-05-15: qty 10

## 2016-05-15 MED ORDER — METHYLPREDNISOLONE SODIUM SUCC 40 MG IJ SOLR
40.0000 mg | Freq: Two times a day (BID) | INTRAMUSCULAR | Status: DC
  Administered 2016-05-16 – 2016-05-19 (×9): 40 mg via INTRAVENOUS
  Filled 2016-05-15 (×9): qty 1

## 2016-05-15 MED ORDER — CITALOPRAM HYDROBROMIDE 20 MG PO TABS
20.0000 mg | ORAL_TABLET | Freq: Every day | ORAL | Status: DC
  Administered 2016-05-16 – 2016-05-17 (×2): 20 mg via ORAL
  Filled 2016-05-15 (×2): qty 1

## 2016-05-15 MED ORDER — ONDANSETRON HCL 4 MG PO TABS: 4.0000 mg | ORAL_TABLET | Freq: Four times a day (QID) | ORAL | Status: DC | PRN

## 2016-05-15 MED ORDER — DOCUSATE SODIUM 100 MG PO CAPS
100.0000 mg | ORAL_CAPSULE | Freq: Every day | ORAL | Status: DC
  Administered 2016-05-16 – 2016-05-20 (×5): 100 mg via ORAL
  Filled 2016-05-15 (×5): qty 1

## 2016-05-15 MED ORDER — HYPROMELLOSE (GONIOSCOPIC) 2.5 % OP SOLN: 1.0000 [drp] | OPHTHALMIC | Status: DC | PRN

## 2016-05-15 MED ORDER — SODIUM CHLORIDE 0.9 % IV SOLN: Freq: Once | INTRAVENOUS | Status: DC

## 2016-05-15 MED ORDER — DEXAMETHASONE SODIUM PHOSPHATE 10 MG/ML IJ SOLN
6.0000 mg | Freq: Once | INTRAMUSCULAR | Status: AC
  Administered 2016-05-15: 6 mg via INTRAMUSCULAR

## 2016-05-15 MED ORDER — IPRATROPIUM-ALBUTEROL 0.5-2.5 (3) MG/3ML IN SOLN
3.0000 mL | Freq: Once | RESPIRATORY_TRACT | Status: AC
  Administered 2016-05-15: 3 mL via RESPIRATORY_TRACT

## 2016-05-15 MED ORDER — ADULT MULTIVITAMIN W/MINERALS CH
1.0000 | ORAL_TABLET | Freq: Two times a day (BID) | ORAL | Status: DC
Start: 2016-05-16 — End: 2016-05-20
  Administered 2016-05-16 – 2016-05-20 (×9): 1 via ORAL
  Filled 2016-05-15 (×9): qty 1

## 2016-05-15 MED ORDER — ONDANSETRON HCL 4 MG/2ML IJ SOLN
4.0000 mg | Freq: Once | INTRAMUSCULAR | Status: AC
  Administered 2016-05-15: 4 mg via INTRAVENOUS
  Filled 2016-05-15: qty 2

## 2016-05-15 MED ORDER — CALCIUM CARBONATE-VITAMIN D 500-200 MG-UNIT PO TABS
1.0000 | ORAL_TABLET | Freq: Two times a day (BID) | ORAL | Status: DC
  Administered 2016-05-16 – 2016-05-20 (×5): 1 via ORAL
  Filled 2016-05-15 (×6): qty 1

## 2016-05-15 MED ORDER — ASPIRIN EC 81 MG PO TBEC
81.0000 mg | DELAYED_RELEASE_TABLET | Freq: Every day | ORAL | Status: DC
Start: 2016-05-16 — End: 2016-05-20
  Administered 2016-05-16 – 2016-05-20 (×5): 81 mg via ORAL
  Filled 2016-05-15 (×5): qty 1

## 2016-05-15 MED ORDER — DEXAMETHASONE SODIUM PHOSPHATE 10 MG/ML IJ SOLN
INTRAMUSCULAR | Status: AC
Start: 2016-05-15 — End: 2016-05-15
  Filled 2016-05-15: qty 1

## 2016-05-15 MED ORDER — GUAIFENESIN ER 600 MG PO TB12
600.0000 mg | ORAL_TABLET | Freq: Two times a day (BID) | ORAL | Status: DC
  Administered 2016-05-16 – 2016-05-20 (×9): 600 mg via ORAL
  Filled 2016-05-15 (×9): qty 1

## 2016-05-15 MED ORDER — ONDANSETRON 4 MG PO TBDP
ORAL_TABLET | ORAL | Status: AC
Start: 2016-05-15 — End: 2016-05-15
  Filled 2016-05-15: qty 1

## 2016-05-15 MED ORDER — POTASSIUM CHLORIDE CRYS ER 20 MEQ PO TBCR
20.0000 meq | EXTENDED_RELEASE_TABLET | Freq: Every day | ORAL | Status: DC
Start: 2016-05-16 — End: 2016-05-19
  Administered 2016-05-16 – 2016-05-18 (×3): 20 meq via ORAL
  Filled 2016-05-15 (×3): qty 1

## 2016-05-15 MED ORDER — PANTOPRAZOLE SODIUM 40 MG PO TBEC
40.0000 mg | DELAYED_RELEASE_TABLET | Freq: Every day | ORAL | Status: DC
  Administered 2016-05-16 – 2016-05-20 (×5): 40 mg via ORAL
  Filled 2016-05-15 (×5): qty 1

## 2016-05-15 MED ORDER — ONDANSETRON HCL 4 MG/2ML IJ SOLN: 4.0000 mg | Freq: Four times a day (QID) | INTRAMUSCULAR | Status: DC | PRN

## 2016-05-15 MED ORDER — METOPROLOL SUCCINATE ER 50 MG PO TB24
50.0000 mg | ORAL_TABLET | Freq: Every day | ORAL | Status: DC
  Administered 2016-05-16 – 2016-05-20 (×5): 50 mg via ORAL
  Filled 2016-05-15 (×6): qty 1

## 2016-05-15 MED ORDER — LEVALBUTEROL HCL 1.25 MG/0.5ML IN NEBU: 1.2500 mg | INHALATION_SOLUTION | RESPIRATORY_TRACT | Status: DC | PRN

## 2016-05-15 MED ORDER — IPRATROPIUM-ALBUTEROL 0.5-2.5 (3) MG/3ML IN SOLN
3.0000 mL | Freq: Once | RESPIRATORY_TRACT | Status: AC
  Administered 2016-05-15: 3 mL via RESPIRATORY_TRACT
  Filled 2016-05-15: qty 3

## 2016-05-15 MED ORDER — ACETAMINOPHEN 325 MG PO TABS
650.0000 mg | ORAL_TABLET | Freq: Four times a day (QID) | ORAL | Status: DC | PRN
  Administered 2016-05-18 – 2016-05-20 (×3): 650 mg via ORAL
  Filled 2016-05-15 (×4): qty 2

## 2016-05-15 MED ORDER — ENOXAPARIN SODIUM 40 MG/0.4ML ~~LOC~~ SOLN: 40.0000 mg | Freq: Every day | SUBCUTANEOUS | Status: DC

## 2016-05-15 MED ORDER — BUDESONIDE 0.25 MG/2ML IN SUSP
0.2500 mg | Freq: Two times a day (BID) | RESPIRATORY_TRACT | Status: DC
  Administered 2016-05-16 – 2016-05-19 (×9): 0.25 mg via RESPIRATORY_TRACT
  Filled 2016-05-15 (×10): qty 2

## 2016-05-15 MED ORDER — IPRATROPIUM BROMIDE 0.02 % IN SOLN
0.5000 mg | Freq: Once | RESPIRATORY_TRACT | Status: AC
  Administered 2016-05-15: 0.5 mg via RESPIRATORY_TRACT
  Filled 2016-05-15: qty 2.5

## 2016-05-15 MED ORDER — ONDANSETRON 4 MG PO TBDP
4.0000 mg | ORAL_TABLET | Freq: Once | ORAL | Status: AC
  Administered 2016-05-15: 4 mg via ORAL

## 2016-05-15 MED ORDER — OMEGA-3-ACID ETHYL ESTERS 1 G PO CAPS
2.0000 | ORAL_CAPSULE | Freq: Two times a day (BID) | ORAL | Status: DC
Start: 2016-05-16 — End: 2016-05-20
  Administered 2016-05-16 – 2016-05-20 (×8): 2 g via ORAL
  Filled 2016-05-15 (×9): qty 2

## 2016-05-15 MED ORDER — VITAMIN C 500 MG PO TABS
1000.0000 mg | ORAL_TABLET | Freq: Two times a day (BID) | ORAL | Status: DC
  Administered 2016-05-16 – 2016-05-20 (×9): 1000 mg via ORAL
  Filled 2016-05-15 (×9): qty 2

## 2016-05-15 MED ORDER — FLUTICASONE PROPIONATE 50 MCG/ACT NA SUSP
2.0000 | Freq: Every day | NASAL | Status: DC
  Administered 2016-05-16 – 2016-05-20 (×5): 2 via NASAL
  Filled 2016-05-15: qty 16

## 2016-05-15 MED ORDER — OSELTAMIVIR PHOSPHATE 30 MG PO CAPS
30.0000 mg | ORAL_CAPSULE | Freq: Two times a day (BID) | ORAL | Status: DC
  Administered 2016-05-16 – 2016-05-20 (×9): 30 mg via ORAL
  Filled 2016-05-15 (×12): qty 1

## 2016-05-15 MED ORDER — ALBUTEROL SULFATE (2.5 MG/3ML) 0.083% IN NEBU
INHALATION_SOLUTION | RESPIRATORY_TRACT | Status: AC
  Filled 2016-05-15: qty 3

## 2016-05-15 MED ORDER — ACETAMINOPHEN 650 MG RE SUPP: 650.0000 mg | Freq: Four times a day (QID) | RECTAL | Status: DC | PRN

## 2016-05-15 MED ORDER — IPRATROPIUM-ALBUTEROL 0.5-2.5 (3) MG/3ML IN SOLN
RESPIRATORY_TRACT | Status: AC
  Filled 2016-05-15: qty 3

## 2016-05-15 MED ORDER — HYDROCODONE-ACETAMINOPHEN 5-325 MG PO TABS
1.0000 | ORAL_TABLET | Freq: Once | ORAL | Status: AC
  Administered 2016-05-15: 1 via ORAL
  Filled 2016-05-15: qty 1

## 2016-05-15 MED ORDER — AMLODIPINE BESYLATE 10 MG PO TABS
10.0000 mg | ORAL_TABLET | Freq: Every day | ORAL | Status: DC
Start: 2016-05-16 — End: 2016-05-20
  Administered 2016-05-16 – 2016-05-20 (×5): 10 mg via ORAL
  Filled 2016-05-15 (×5): qty 1

## 2016-05-15 MED ORDER — DOXYCYCLINE HYCLATE 100 MG IV SOLR
100.0000 mg | Freq: Two times a day (BID) | INTRAVENOUS | Status: DC
  Administered 2016-05-16 – 2016-05-20 (×10): 100 mg via INTRAVENOUS
  Filled 2016-05-15 (×12): qty 100

## 2016-05-15 MED ORDER — POTASSIUM CHLORIDE CRYS ER 20 MEQ PO TBCR
40.0000 meq | EXTENDED_RELEASE_TABLET | Freq: Once | ORAL | Status: AC
  Administered 2016-05-15: 40 meq via ORAL
  Filled 2016-05-15: qty 2

## 2016-05-15 MED ORDER — LEVALBUTEROL HCL 1.25 MG/0.5ML IN NEBU
1.2500 mg | INHALATION_SOLUTION | RESPIRATORY_TRACT | Status: DC
  Administered 2016-05-16 – 2016-05-17 (×5): 1.25 mg via RESPIRATORY_TRACT
  Filled 2016-05-15 (×7): qty 0.5

## 2016-05-15 MED ORDER — MORPHINE SULFATE (PF) 4 MG/ML IV SOLN
0.5000 mg | INTRAVENOUS | Status: DC | PRN
  Administered 2016-05-15 – 2016-05-20 (×11): 0.52 mg via INTRAVENOUS
  Filled 2016-05-15 (×11): qty 1

## 2016-05-15 MED ORDER — ISOSORBIDE MONONITRATE ER 60 MG PO TB24
60.0000 mg | ORAL_TABLET | Freq: Every day | ORAL | Status: DC
Start: 2016-05-16 — End: 2016-05-20
  Administered 2016-05-16 – 2016-05-20 (×5): 60 mg via ORAL
  Filled 2016-05-15 (×5): qty 1

## 2016-05-15 MED ORDER — ALBUTEROL (5 MG/ML) CONTINUOUS INHALATION SOLN
10.0000 mg/h | INHALATION_SOLUTION | Freq: Once | RESPIRATORY_TRACT | Status: AC
  Administered 2016-05-15: 10 mg/h via RESPIRATORY_TRACT
  Filled 2016-05-15: qty 20

## 2016-05-15 NOTE — ED Notes (Signed)
[facility] not available...[facility] enroute.

## 2016-05-15 NOTE — ED Notes (Signed)
Pt given ice packs for L arm

## 2016-05-15 NOTE — ED Notes (Signed)
Attempted report x1. 

## 2016-05-15 NOTE — H&P (Signed)
History and Physical    [name] [id number] DOB: 06-Jun-1930 DOA: 05/15/2016  PCP: Anthoney Harada, MD  Patient coming from: Home.  Chief Complaint: Shortness of breath.  HPI: [name] is a 83 y.o. female with CAD status post CABG, hypertension, hyperlipidemia who recently moved back to [facility] presents to the ER because of shortness of breath and wheezing over the last 1 week. Patient had gone to the urgent care center recently for bronchitis. In the patient is found to be wheezing and short of breath. Chest x-ray shows chronic findings. Patient denies any chest pain. EKG was showing sinus tachycardia with diffuse ST depression. Labs also revealed severe hyponatremia of 119. Patient states over the last 1 week since she was getting fatigue and weak was drinking more than usual water. Patient in addition also is on Maxzide. Patient is being admitted for acute respiratory failure most likely from bronchitis and severe hyponatremia.   In addition patient is noticed have left shoulder pain and x-ray showed possible left rotator cuff tear. Patient's daughter states that patient used to be on Tylenol with Codeine which was recently tapered off following which last 5 days patient has been having increasing pain in the left shoulder. Does not recall any fall.  ED Course: Patient was started on nebulizer treatment and steroids antibiotics and further workup for low sodium.  Review of Systems: As per HPI, rest all negative.   Past Medical History:  Diagnosis Date  . Asthma   . CAD (coronary artery disease)   . Carotid stenosis    40-59% bilateral  . Chronic pain syndrome   . GERD (gastroesophageal reflux disease)   . HLD (hyperlipidemia)   . HTN (hypertension)   . Skin cancer     Past Surgical History:  Procedure Laterality Date  . ABDOMINAL HYSTERECTOMY  1974  . baldder resuspension  1988  . CORONARY ARTERY BYPASS GRAFT  1996   LIMA to LAD, SVG to PDA and SVG  to D1   . CORONARY ARTERY BYPASS GRAFT  2010   cath 2 vessel CAD with 2 of 3 bypass grafts occluded. had a patent LIMA to LAD  . skin cancer resected    . TONSILLECTOMY       reports that she quit smoking about 38 years ago. Her smoking use included Cigarettes. She has a 40.00 pack-year smoking history. She has never used smokeless tobacco. She reports that she does not drink alcohol or use drugs.  Allergies  Allergen Reactions  . Lisinopril   . Lunesta [Eszopiclone]   . Simvastatin   . Statins     (Causes weakness in legs and decreases ability to walk.  (lipitor crestor)  . Ultram [Tramadol]     Family History  Problem Relation Age of Onset  . Heart disease Brother     multiple; ealry onset  . Heart disease Sister     multiple; early onset   . Breast cancer Sister   . Colon cancer Sister   . Emphysema Daughter     Prior to Admission medications   Medication Sig Start Date End Date Taking? Authorizing Provider  amLODipine (NORVASC) 10 MG tablet Take 1 tablet (10 mg total) by mouth daily. 04/22/16  Yes Sharion Balloon, FNP  aspirin EC 81 MG tablet Take 81 mg by mouth daily.   Yes Historical Provider, MD  azithromycin (ZITHROMAX) 250 MG tablet Take 1 tablet (250 mg total) by mouth daily. Take first 2 tablets together, then  1 every day until finished. 05/12/16  Yes Lysbeth Penner, FNP  benzonatate (TESSALON) 100 MG capsule Take 1 capsule (100 mg total) by mouth every 8 (eight) hours. 05/12/16  Yes Lysbeth Penner, FNP  Calcium Carbonate-Vitamin D (CALCIUM 600/VITAMIN D) 600-400 MG-UNIT per tablet Take 1 tablet by mouth 2 (two) times daily.     Yes Historical Provider, MD  cholecalciferol (VITAMIN D) 400 units TABS tablet Take 400 Units by mouth daily.   Yes Historical Provider, MD  citalopram (CELEXA) 20 MG tablet TAKE 1 TABLET (20 MG TOTAL) BY MOUTH DAILY. 04/22/16  Yes Sharion Balloon, FNP  docusate sodium (COLACE) 100 MG capsule Take 100 mg by mouth daily.   Yes Historical  Provider, MD  fluticasone (FLONASE) 50 MCG/ACT nasal spray PLACE 2 SPRAYS INTO THE NOSE DAILY 04/24/16  Yes Sharion Balloon, FNP  guaiFENesin (MUCINEX) 600 MG 12 hr tablet Take 600 mg by mouth.   Yes Historical Provider, MD  HYDROcodone-acetaminophen (NORCO/VICODIN) 5-325 MG tablet Take 1 tablet by mouth every 4 (four) hours as needed. 05/12/16  Yes Lysbeth Penner, FNP  hydroxypropyl methylcellulose / hypromellose (ISOPTO TEARS / GONIOVISC) 2.5 % ophthalmic solution Place 1 drop into both eyes as needed for dry eyes.   Yes Historical Provider, MD  isosorbide mononitrate (IMDUR) 60 MG 24 hr tablet TAKE 1 TABLET (60 MG TOTAL) BY MOUTH DAILY. 04/22/16  Yes Sharion Balloon, FNP  loratadine (CLARITIN) 10 MG tablet Take 10 mg by mouth daily.     Yes Historical Provider, MD  Melatonin-Pyridoxine (MELATIN) 3-1 MG TABS Take 2 tablets by mouth at bedtime.     Yes Historical Provider, MD  metoprolol succinate (TOPROL-XL) 50 MG 24 hr tablet TAKE 1 TABLET (50 MG TOTAL) BY MOUTH DAILY. 04/22/16  Yes Sharion Balloon, FNP  Multiple Vitamin (MULTIVITAMIN) tablet Take 1 tablet by mouth 2 (two) times daily.    Yes Historical Provider, MD  Omega-3 Fatty Acids (FISH OIL) 1000 MG CAPS Take 2 capsules by mouth 2 (two) times daily.     Yes Historical Provider, MD  omeprazole (PRILOSEC) 20 MG capsule TAKE 1 CAPSULE (20 MG TOTAL) BY MOUTH 2 (TWO) TIMES DAILY BEFORE A MEAL. 04/22/16  Yes Sharion Balloon, FNP  potassium chloride (MICRO-K) 10 MEQ CR capsule TAKE 1 CAPSULE (10 MEQ TOTAL) BY MOUTH DAILY. 04/22/16  Yes Sharion Balloon, FNP  triamterene-hydrochlorothiazide (MAXZIDE) 75-50 MG tablet TAKE 1 TABLET BY MOUTH DAILY. 04/24/16  Yes Sharion Balloon, FNP  vitamin C (ASCORBIC ACID) 500 MG tablet Take 1,000 mg by mouth 2 (two) times daily.     Yes Historical Provider, MD  acetaminophen-codeine (TYLENOL #3) 300-30 MG tablet TAKE 2 TABLETS AT BEDTIME AS NEEDED Patient not taking: Reported on 05/15/2016 04/22/16   Sharion Balloon, FNP  montelukast (SINGULAIR) 10 MG tablet TAKE 1 TABLET (10 MG TOTAL) BY MOUTH AT BEDTIME. Patient not taking: Reported on 05/15/2016 04/22/16   Sharion Balloon, FNP  promethazine (PHENERGAN) 25 MG tablet Take 2 tablets (50 mg total) by mouth at bedtime. Patient not taking: Reported on 05/15/2016 04/22/16   Sharion Balloon, FNP  triamterene-hydrochlorothiazide (MAXZIDE-25) 37.5-25 MG tablet Take 1 tablet by mouth daily. Patient not taking: Reported on 05/15/2016 04/22/16   Sharion Balloon, FNP    Physical Exam: Vitals:   05/15/16 2000 05/15/16 2030 05/15/16 2130 05/15/16 2215  BP: 154/61 133/71 129/61 110/60  Pulse: 106 119 (!) 131 (!) 123  Resp: 16 22  22 18  Temp:      TempSrc:      SpO2: 99% 100% 94% 94%  Weight:      Height:          Constitutional: Moderately built and nourished woman Vitals:   05/15/16 2000 05/15/16 2030 05/15/16 2130 05/15/16 2215  BP: 154/61 133/71 129/61 110/60  Pulse: 106 119 (!) 131 (!) 123  Resp: 16 22 22 18   Temp:      TempSrc:      SpO2: 99% 100% 94% 94%  Weight:      Height:       Eyes: Anicteric no pallor. ENMT: No discharge from the ears eyes nose and mouth. Neck: No mass felt. No JVD appreciated. Respiratory: Bilateral expiratory wheezes no crepitations. Cardiovascular: S1-S2 heard no murmurs appreciated. Abdomen: Soft nontender bowel sounds present. No guarding or rigidity. Musculoskeletal: Pain on moving her left shoulder. Skin: No rash. Skin appears warm. Neurologic: Alert awake oriented to time place and person. Moves all extremities. Psychiatric: Appears normal. Normal affect.   Labs on Admission: I have personally reviewed following labs and imaging studies  CBC:  Recent Labs Lab 05/15/16 1603  WBC 6.5  NEUTROABS 5.6  HGB 12.8  HCT 35.5*  MCV 85.5  PLT 123456   Basic Metabolic Panel:  Recent Labs Lab 05/15/16 1603 05/15/16 2036  NA 120* 119*  K 3.0* 3.2*  CL 82* 83*  CO2 26 26  GLUCOSE 117* 255*  BUN 16  17  CREATININE 0.67 0.78  CALCIUM 8.6* 8.7*   GFR: Estimated Creatinine Clearance: 44.5 mL/min (by C-G formula based on SCr of 0.78 mg/dL). Liver Function Tests: No results for input(s): AST, ALT, ALKPHOS, BILITOT, PROT, ALBUMIN in the last 168 hours. No results for input(s): LIPASE, AMYLASE in the last 168 hours. No results for input(s): AMMONIA in the last 168 hours. Coagulation Profile: No results for input(s): INR, PROTIME in the last 168 hours. Cardiac Enzymes:  Recent Labs Lab 05/15/16 2036  TROPONINI 0.07*   BNP (last 3 results) No results for input(s): PROBNP in the last 8760 hours. HbA1C: No results for input(s): HGBA1C in the last 72 hours. CBG: No results for input(s): GLUCAP in the last 168 hours. Lipid Profile: No results for input(s): CHOL, HDL, LDLCALC, TRIG, CHOLHDL, LDLDIRECT in the last 72 hours. Thyroid Function Tests: No results for input(s): TSH, T4TOTAL, FREET4, T3FREE, THYROIDAB in the last 72 hours. Anemia Panel: No results for input(s): VITAMINB12, FOLATE, FERRITIN, TIBC, IRON, RETICCTPCT in the last 72 hours. Urine analysis:    Component Value Date/Time   APPEARANCEUR Clear 03/07/2016 1428   GLUCOSEU Negative 03/07/2016 1428   BILIRUBINUR Negative 03/07/2016 1428   PROTEINUR Negative 03/07/2016 1428   UROBILINOGEN negative 09/25/2013 1632   NITRITE Negative 03/07/2016 1428   LEUKOCYTESUR 3+ (A) 03/07/2016 1428   Sepsis Labs: @LABRCNTIP (procalcitonin:4,lacticidven:4) )No results found for this or any previous visit (from the past 240 hour(s)).   Radiological Exams on Admission: Dg Chest 2 View  Result Date: 05/15/2016 CLINICAL DATA:  Left arm pain starting 3 weeks ago EXAM: CHEST  2 VIEW COMPARISON:  05/12/2016 FINDINGS: Cardiomediastinal silhouette is stable. Coarse interstitial prominence bilaterally again noted. Status post median sternotomy. Stable bilateral basilar scarring. No infiltrate or pulmonary edema. Again noted significant  degenerative changes left shoulder. Again there is sclerosis of left glenoid and humeral head with volume loss and deformity of the humeral head. 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 There is anterior inferior subluxation of humeral  head from glenohumeral joint. IMPRESSION: Coarse interstitial prominence bilaterally again noted. Status post median sternotomy. Stable bilateral basilar scarring. No infiltrate or pulmonary edema. Again noted significant degenerative changes left shoulder. Again there is sclerosis of left glenoid and humeral head with volume loss and deformity of the humeral head. 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 There is anterior inferior subluxation of humeral head from glenohumeral joint. Electronically Signed   By: Lahoma Crocker M.D.   On: 05/15/2016 17:42    EKG: Independently reviewed. Sinus tachycardia.  Assessment/Plan Principal Problem:   Acute respiratory failure with hypoxemia (HCC) Active Problems:   Essential hypertension, benign   CORONARY ARTERY BYPASS GRAFT, THREE VESSEL, HX OF   Asthma, chronic   Hyponatremia   Acute respiratory failure with hypoxia (HCC)    1. Acute respiratory failure with hypoxia most likely from bronchitis - patient is placed on steroids antibiotics and since patient is tachycardic will avoid albuterol and place patient on Xopenex. Closely monitor in stepdown. Will keep patient on Tamiflu and check influenza PCR. 2. Severe hyponatremia - patient states she has been drinking more than usual water. Patient is also on Maxzide which will be discontinued. Check TSH cortisol levels urine for sodium osmolality and uric acid, serum osmolality. Closely follow metabolic panel. Based on the test ordered further recommendations. 3. CAD status post CABG - EKG shows ST depression. Will cycle cardiac markers and if positive will start heparin. Patient is on Toprol and aspirin. Intolerant to statins. 4. Hypertension -  discontinuing Maxzide due to hyponatremia. Patient is on Imdur and Toprol and amlodipine. 5. Left shoulder pain with possible rotator cuff tear - will need orthopedic referral.   DVT prophylaxis: Lovenox. Code Status: Full code.  Family Communication: Discussed with patient's daughter.  Disposition Plan: Home.  Consults called: None.  Admission status: Inpatient.    Rise Patience MD [facility] Pager [phone].  If 7PM-7AM, please contact night-coverage [email or web address] Password [email or web address]  05/15/2016, 10:21 PM

## 2016-05-15 NOTE — ED Triage Notes (Addendum)
Cough   And   Wheezing   X  4    Days      Seen  Here  2  Days   Ago       She   Was       Put   Her  On   Antibiotic          And      Pain meds   She  Reports    Is  Making  Her  Nausea   And  Constipated       She  Has   Shoulder  Pain     Has  An  appt  With  Orthopedist  tommorow       They  Did  A  Chest  X  Ray    2   Days  ago cough     Is  Non  Productive

## 2016-05-15 NOTE — ED Notes (Signed)
Pt is tachycardic, alerted MD and printed EKG, will continue to monitor

## 2016-05-15 NOTE — ED Notes (Signed)
Pt using bedside commode

## 2016-05-15 NOTE — ED Provider Notes (Signed)
CSN: [id number]     Arrival date & time 05/15/16  1206 History   First MD Initiated Contact with Patient 05/15/16 1258     Chief Complaint  Patient presents with  . Cough   (Consider location/radiation/quality/duration/timing/severity/associated sxs/prior Treatment) 83 year old female returned to the urgent care after she was here on January 19 and diagnosed with rotator cuff tear tendinopathy on the left, persistent chronic arthritic pain to the left knee, URI and was placed on Norco 5 mg #20 and a azithromycin. Her daughter is with her and does most of the talking. She states that she is having nausea and GI intolerance along with constipation associated with a Norco every like to stop taking that and try the tramadol. The other and she reason for her visit today is that she is having increased wheeze and cough. She is feeling weaker as well. She had an albuterol inhaler at home and is uncertain as to whether she was actually to use it once or not but she decided throat away. She is afebrile. Awake and alert. She speaks in short brief sentences and single words.  Past medical history includes asthma, CAD, chronic pain syndrome, GERD, hypertension, past smoking quit in 1979.      Past Medical History:  Diagnosis Date  . Asthma   . CAD (coronary artery disease)   . Carotid stenosis    40-59% bilateral  . Chronic pain syndrome   . GERD (gastroesophageal reflux disease)   . HLD (hyperlipidemia)   . HTN (hypertension)   . Skin cancer    Past Surgical History:  Procedure Laterality Date  . ABDOMINAL HYSTERECTOMY  1974  . baldder resuspension  1988  . CORONARY ARTERY BYPASS GRAFT  1996   LIMA to LAD, SVG to PDA and SVG to D1   . CORONARY ARTERY BYPASS GRAFT  2010   cath 2 vessel CAD with 2 of 3 bypass grafts occluded. had a patent LIMA to LAD  . skin cancer resected    . TONSILLECTOMY     Family History  Problem Relation Age of Onset  . Heart disease Brother     multiple; ealry  onset  . Heart disease Sister     multiple; early onset   . Breast cancer Sister   . Colon cancer Sister   . Emphysema Daughter    Social History  Substance Use Topics  . Smoking status: Former Smoker    Packs/day: 2.00    Years: 20.00    Types: Cigarettes    Quit date: 10/25/1977  . Smokeless tobacco: Never Used     Comment: smoked 2ppd for 15 years; quit in 1979  . Alcohol use No   OB History    No data available     Review of Systems  Constitutional: Positive for activity change. Negative for fever.       General tiredness and weakness.  HENT: Positive for congestion.   Eyes: Negative.   Respiratory: Positive for cough, shortness of breath and wheezing.   Cardiovascular: Negative for chest pain.  Gastrointestinal: Positive for constipation and nausea. Negative for vomiting.  Genitourinary: Negative.   Musculoskeletal: Positive for arthralgias and gait problem.    Allergies  Lisinopril; Lunesta [eszopiclone]; Simvastatin; Statins; and Ultram [tramadol]  Home Medications   Prior to Admission medications   Medication Sig Start Date End Date Taking? Authorizing Provider  acetaminophen-codeine (TYLENOL #3) 300-30 MG tablet TAKE 2 TABLETS AT BEDTIME AS NEEDED 04/22/16   Sharion Balloon, FNP  amLODipine (NORVASC) 10 MG tablet Take 1 tablet (10 mg total) by mouth daily. 04/22/16   Sharion Balloon, FNP  azithromycin (ZITHROMAX) 250 MG tablet Take 1 tablet (250 mg total) by mouth daily. Take first 2 tablets together, then 1 every day until finished. 05/12/16   Lysbeth Penner, FNP  benzonatate (TESSALON) 100 MG capsule Take 1 capsule (100 mg total) by mouth every 8 (eight) hours. 05/12/16   Lysbeth Penner, FNP  Calcium Carbonate-Vitamin D (CALCIUM 600/VITAMIN D) 600-400 MG-UNIT per tablet Take 1 tablet by mouth 2 (two) times daily.      Historical Provider, MD  citalopram (CELEXA) 20 MG tablet TAKE 1 TABLET (20 MG TOTAL) BY MOUTH DAILY. 04/22/16   Sharion Balloon, FNP   fluticasone (FLONASE) 50 MCG/ACT nasal spray PLACE 2 SPRAYS INTO THE NOSE DAILY 04/24/16   Sharion Balloon, FNP  HYDROcodone-acetaminophen (NORCO/VICODIN) 5-325 MG tablet Take 1 tablet by mouth every 4 (four) hours as needed. 05/12/16   Lysbeth Penner, FNP  isosorbide mononitrate (IMDUR) 60 MG 24 hr tablet TAKE 1 TABLET (60 MG TOTAL) BY MOUTH DAILY. 04/22/16   Sharion Balloon, FNP  loratadine (CLARITIN) 10 MG tablet Take 10 mg by mouth daily.      Historical Provider, MD  Melatonin-Pyridoxine (MELATIN) 3-1 MG TABS Take 2 tablets by mouth at bedtime.      Historical Provider, MD  metoprolol succinate (TOPROL-XL) 50 MG 24 hr tablet TAKE 1 TABLET (50 MG TOTAL) BY MOUTH DAILY. 04/22/16   Sharion Balloon, FNP  montelukast (SINGULAIR) 10 MG tablet TAKE 1 TABLET (10 MG TOTAL) BY MOUTH AT BEDTIME. 04/22/16   Sharion Balloon, FNP  Multiple Vitamin (MULTIVITAMIN) tablet Take 1 tablet by mouth 2 (two) times daily.     Historical Provider, MD  Omega-3 Fatty Acids (FISH OIL) 1000 MG CAPS Take 2 capsules by mouth 2 (two) times daily.      Historical Provider, MD  omeprazole (PRILOSEC) 20 MG capsule TAKE 1 CAPSULE (20 MG TOTAL) BY MOUTH 2 (TWO) TIMES DAILY BEFORE A MEAL. 04/22/16   Sharion Balloon, FNP  potassium chloride (MICRO-K) 10 MEQ CR capsule TAKE 1 CAPSULE (10 MEQ TOTAL) BY MOUTH DAILY. 04/22/16   Sharion Balloon, FNP  promethazine (PHENERGAN) 25 MG tablet Take 2 tablets (50 mg total) by mouth at bedtime. 04/22/16   Sharion Balloon, FNP  triamterene-hydrochlorothiazide (MAXZIDE) 75-50 MG tablet TAKE 1 TABLET BY MOUTH DAILY. 04/24/16   Sharion Balloon, FNP  triamterene-hydrochlorothiazide (MAXZIDE-25) 37.5-25 MG tablet Take 1 tablet by mouth daily. 04/22/16   Sharion Balloon, FNP  vitamin C (ASCORBIC ACID) 500 MG tablet Take 1,000 mg by mouth 2 (two) times daily.      Historical Provider, MD   Meds Ordered and Administered this Visit   Medications  0.9 %  sodium chloride infusion (not administered)   ipratropium-albuterol (DUONEB) 0.5-2.5 (3) MG/3ML nebulizer solution 3 mL (3 mLs Nebulization Given 05/15/16 1352)  dexamethasone (DECADRON) injection 6 mg (6 mg Intramuscular Given 05/15/16 1348)  ondansetron (ZOFRAN-ODT) disintegrating tablet 4 mg (4 mg Oral Given 05/15/16 1404)    BP 138/80 (BP Location: Right Arm)   Pulse 92   Temp 97.5 F (36.4 C) (Oral)   Resp 24   SpO2 93%  No data found.   Physical Exam  Constitutional: She is oriented to person, place, and time. She appears well-developed and well-nourished. No distress.  HENT:  Mouth/Throat: Oropharynx is clear and moist. No  oropharyngeal exudate.  Neck: Normal range of motion.  Cardiovascular: Normal rate, regular rhythm and intact distal pulses.   Pulmonary/Chest: She has wheezes.  Increased respiratory effort. Prolonged expiratory phase. Expiratory wheezing they can be heard without stethoscope. Chest auscultation reveals generalized coarse bilateral wheezing and coarseness. Decreased air movement.   Musculoskeletal:  Tenderness to the left shoulder and knee.  Lymphadenopathy:    She has no cervical adenopathy.  Neurological: She is alert and oriented to person, place, and time.  Skin: Skin is warm.  Psychiatric: She has a normal mood and affect.  Nursing note and vitals reviewed.   Urgent Care Course      Procedures (including critical care time)  Labs Review Labs Reviewed - No data to display  Imaging Review No results found.   Visual Acuity Review  Right Eye Distance:   Left Eye Distance:   Bilateral Distance:    Right Eye Near:   Left Eye Near:    Bilateral Near:         MDM   1. Bronchitis   2. Bronchospasm   3. Shortness of breath   4. Interstitial lung disease ([place])    Due to progressive nature of respiratory symptoms and disease evident the past week as well as decreased oxygen saturation, fatigue, bronchospasm and worsening of interstitial disease the patient is being transferred  via care Link to [place] for evaluation and treatment. She received Decadron 6 mg IM and a DuoNeb here. The patient states she does not feel much better however there has been modest improvement in diminished wheezing. Crackles are now heard in by basilar areas approximately halfway up toward but not to include the scapula.     Janne Napoleon, NP 05/15/16 1441

## 2016-05-15 NOTE — ED Provider Notes (Signed)
[facility] DEPT Provider Note   CSN: [id number] Arrival date & time: 05/15/16  1526     History   Chief Complaint Chief Complaint  Patient presents with  . Shortness of Breath    HPI [name] is a 83 y.o. female.  HPI 83 year old female with a history of CAD, asthma, HLD, HTN presenting with URI symptoms and worsening shortness of breath for one week. She was seen at urgent care and given IV steroids and nebulizer and transferred here for increased work of breathing. She denies any fevers at home. Endorses cough with no sputum production. She has a history of smoking in the past she quit years ago. He has never been told she has COPD. Patient's daughter is with her and states that she has been drinking a significant amount of water recently due to "trying to stay hydrated".  she denies any dysuria. She also states that she has had complete rotator cuff tear and is supposed to be seeing an orthopedic surgeon tomorrow. She endorses left shoulder pain that has been ongoing for many weeks. Denies Recent falls or trauma.  Past Medical History:  Diagnosis Date  . Asthma   . CAD (coronary artery disease)   . Carotid stenosis    40-59% bilateral  . Chronic pain syndrome   . GERD (gastroesophageal reflux disease)   . HLD (hyperlipidemia)   . HTN (hypertension)   . Skin cancer     Patient Active Problem List   Diagnosis Date Noted  . Overweight (BMI 25.0-29.9) 02/11/2016  . Depression 01/08/2014  . Asthma, chronic 01/08/2014  . GERD (gastroesophageal reflux disease) 01/08/2014  . Chronic pain syndrome 05/17/2013  . Insomnia 05/17/2013  . Elevated serum creatinine 11/22/2012  . CAROTID BRUIT 12/31/2008  . Hyperlipidemia 07/09/2008  . Essential hypertension, benign 07/09/2008  . Coronary atherosclerosis 07/09/2008  . CORONARY ARTERY BYPASS GRAFT, THREE VESSEL, HX OF 06/29/1994    Past Surgical History:  Procedure Laterality Date  . ABDOMINAL HYSTERECTOMY  1974  .  baldder resuspension  1988  . CORONARY ARTERY BYPASS GRAFT  1996   LIMA to LAD, SVG to PDA and SVG to D1   . CORONARY ARTERY BYPASS GRAFT  2010   cath 2 vessel CAD with 2 of 3 bypass grafts occluded. had a patent LIMA to LAD  . skin cancer resected    . TONSILLECTOMY      OB History    No data available       Home Medications    Prior to Admission medications   Medication Sig Start Date End Date Taking? Authorizing Provider  acetaminophen-codeine (TYLENOL #3) 300-30 MG tablet TAKE 2 TABLETS AT BEDTIME AS NEEDED 04/22/16   Sharion Balloon, FNP  amLODipine (NORVASC) 10 MG tablet Take 1 tablet (10 mg total) by mouth daily. 04/22/16   Sharion Balloon, FNP  azithromycin (ZITHROMAX) 250 MG tablet Take 1 tablet (250 mg total) by mouth daily. Take first 2 tablets together, then 1 every day until finished. 05/12/16   Lysbeth Penner, FNP  benzonatate (TESSALON) 100 MG capsule Take 1 capsule (100 mg total) by mouth every 8 (eight) hours. 05/12/16   Lysbeth Penner, FNP  Calcium Carbonate-Vitamin D (CALCIUM 600/VITAMIN D) 600-400 MG-UNIT per tablet Take 1 tablet by mouth 2 (two) times daily.      Historical Provider, MD  citalopram (CELEXA) 20 MG tablet TAKE 1 TABLET (20 MG TOTAL) BY MOUTH DAILY. 04/22/16   Sharion Balloon, FNP  fluticasone (  FLONASE) 50 MCG/ACT nasal spray PLACE 2 SPRAYS INTO THE NOSE DAILY 04/24/16   Sharion Balloon, FNP  HYDROcodone-acetaminophen (NORCO/VICODIN) 5-325 MG tablet Take 1 tablet by mouth every 4 (four) hours as needed. 05/12/16   Lysbeth Penner, FNP  isosorbide mononitrate (IMDUR) 60 MG 24 hr tablet TAKE 1 TABLET (60 MG TOTAL) BY MOUTH DAILY. 04/22/16   Sharion Balloon, FNP  loratadine (CLARITIN) 10 MG tablet Take 10 mg by mouth daily.      Historical Provider, MD  Melatonin-Pyridoxine (MELATIN) 3-1 MG TABS Take 2 tablets by mouth at bedtime.      Historical Provider, MD  metoprolol succinate (TOPROL-XL) 50 MG 24 hr tablet TAKE 1 TABLET (50 MG TOTAL) BY MOUTH  DAILY. 04/22/16   Sharion Balloon, FNP  montelukast (SINGULAIR) 10 MG tablet TAKE 1 TABLET (10 MG TOTAL) BY MOUTH AT BEDTIME. 04/22/16   Sharion Balloon, FNP  Multiple Vitamin (MULTIVITAMIN) tablet Take 1 tablet by mouth 2 (two) times daily.     Historical Provider, MD  Omega-3 Fatty Acids (FISH OIL) 1000 MG CAPS Take 2 capsules by mouth 2 (two) times daily.      Historical Provider, MD  omeprazole (PRILOSEC) 20 MG capsule TAKE 1 CAPSULE (20 MG TOTAL) BY MOUTH 2 (TWO) TIMES DAILY BEFORE A MEAL. 04/22/16   Sharion Balloon, FNP  potassium chloride (MICRO-K) 10 MEQ CR capsule TAKE 1 CAPSULE (10 MEQ TOTAL) BY MOUTH DAILY. 04/22/16   Sharion Balloon, FNP  promethazine (PHENERGAN) 25 MG tablet Take 2 tablets (50 mg total) by mouth at bedtime. 04/22/16   Sharion Balloon, FNP  triamterene-hydrochlorothiazide (MAXZIDE) 75-50 MG tablet TAKE 1 TABLET BY MOUTH DAILY. 04/24/16   Sharion Balloon, FNP  triamterene-hydrochlorothiazide (MAXZIDE-25) 37.5-25 MG tablet Take 1 tablet by mouth daily. 04/22/16   Sharion Balloon, FNP  vitamin C (ASCORBIC ACID) 500 MG tablet Take 1,000 mg by mouth 2 (two) times daily.      Historical Provider, MD    Family History Family History  Problem Relation Age of Onset  . Heart disease Brother     multiple; ealry onset  . Heart disease Sister     multiple; early onset   . Breast cancer Sister   . Colon cancer Sister   . Emphysema Daughter     Social History Social History  Substance Use Topics  . Smoking status: Former Smoker    Packs/day: 2.00    Years: 20.00    Types: Cigarettes    Quit date: 10/25/1977  . Smokeless tobacco: Never Used     Comment: smoked 2ppd for 15 years; quit in 1979  . Alcohol use No     Allergies   Lisinopril; Lunesta [eszopiclone]; Simvastatin; Statins; and Ultram [tramadol]   Review of Systems Review of Systems  Constitutional: Negative for chills and fever.  HENT: Negative.   Respiratory: Positive for cough and shortness of  breath. Negative for wheezing.   Cardiovascular: Negative for chest pain and palpitations.  Gastrointestinal: Negative for abdominal distention, diarrhea, nausea and vomiting.  Genitourinary: Negative.   Musculoskeletal: Negative for back pain and neck pain.  Neurological: Negative for facial asymmetry, weakness and numbness.  All other systems reviewed and are negative.    Physical Exam Updated Vital Signs BP 164/75   Pulse 104   Temp 98.5 F (36.9 C) (Oral)   Ht 5\' 1"  (1.549 m)   Wt 63 kg   SpO2 93%   BMI 26.26 kg/m  Physical Exam  Constitutional: She is oriented to person, place, and time. She appears cachectic. No distress.  HENT:  Head: Normocephalic and atraumatic.  Eyes: Conjunctivae and EOM are normal. Pupils are equal, round, and reactive to light.  Neck: Trachea normal, normal range of motion and phonation normal. Neck supple.  Cardiovascular: Normal rate, regular rhythm, S1 normal, S2 normal, normal heart sounds, intact distal pulses and normal pulses.  Exam reveals no gallop and no friction rub.   No murmur heard. Pulmonary/Chest: Accessory muscle usage present. Tachypnea noted. She has decreased breath sounds. She has wheezes (diffuse).  Abdominal: Soft. She exhibits no distension. There is no tenderness.  Musculoskeletal: She exhibits edema (mild nonpitting edema in BL lE). She exhibits no deformity.  Neurological: She is alert and oriented to person, place, and time.  Skin: Skin is warm. Capillary refill takes less than 2 seconds.  Nursing note and vitals reviewed.    ED Treatments / Results  Labs (all labs ordered are listed, but only abnormal results are displayed) Labs Reviewed - No data to display  EKG  EKG Interpretation  Date/Time:  Sunday May 15 2016 16:42:18 EST Ventricular Rate:  103 PR Interval:    QRS Duration: 97 QT Interval:  355 QTC Calculation: 465 R Axis:   19 Text Interpretation:  Sinus tachycardia LVH with secondary  repolarization abnormality ST depression diffuse Confirmed by ZAVITZ MD, Vonna Kotyk [id number]) on 05/15/2016 4:55:49 PM       Radiology No results found.  Procedures Procedures (including critical care time)  Medications Ordered in ED Medications - No data to display   Initial Impression / Assessment and Plan / ED Course  I have reviewed the triage vital signs and the nursing notes.  Pertinent labs & imaging results that were available during my care of the patient were reviewed by me and considered in my medical decision making (see chart for details).    83 year old female with a history of CAD, asthma presenting with 1 week of worsening shortness of breath and left shoulder pain. No new falls trauma. Shoulder pain is chronic. She was given a DuoNeb and steroids at urgent care and sent over here. On exam she has diffuse wheezing and decreased breath sounds, patient given second DuoNeb. Chest x-ray without significant signs of pulmonary edema or focal opacity. There is subluxation of the left humeral head but is also noted from the previous chest x-ray. Patient given Norco for pain.   Patient likely with COPD exacerbation. On reassessment she continues to have decreased breath sounds diffuse wheezing with accessory muscle use. Patient placed on continuous nebulizer. BNP 129. CBC grossly unremarkable. BNP notable for a sodium of 120 and a potassium of 3. Patient given 40 mEq of potassium oral. Repeat BMP sent to confirm sodium. Hyponatremia possibly due to polydipsia versus CHF however CHF, slightly as BNP is only mildly elevated and cxr without significant evidence of pulmonary edema.  Repeat BMP with Na 119. Pt without neurologic symptoms or seizures.  Pt admitted to step down with Dr. Hal Hope for hyponatremia and likely COPD vs CHF exacerbation.  Patient care discussed and supervised by my attending, Dr. Reather Converse. Drucie Ip, MD    Final Clinical Im Allpressions(s) / ED Diagnoses    Final diagnoses:  Hyponatremia  COPD exacerbation [facility])    New Prescriptions New Prescriptions   No medications on file     Korion Cuevas Mali Rodney Wigger, MD 05/16/16 0011    Elnora Morrison, MD 05/16/16 [phone]

## 2016-05-15 NOTE — ED Triage Notes (Signed)
Per EMS pt from home and came to urgent care for URI, pt was given an IV decadron 6mg  and nebulizer treatment at urgent care.

## 2016-05-15 NOTE — ED Notes (Signed)
[facility] on scene.

## 2016-05-15 NOTE — ED Notes (Addendum)
Called respiratory for continuous neb.

## 2016-05-15 NOTE — Discharge Instructions (Signed)
Due to progressive nature of respiratory symptoms and disease evident the past week as well as decreased oxygen saturation, fatigue, bronchospasm and worsening of interstitial disease the patient is being transferred via [facility] to [facility] for evaluation and treatment. She received Decadron 6 mg IM and a DuoNeb here. The patient states she does not feel much better however there has been modest improvement in diminished wheezing. Crackles are now heard in by basilar areas approximately halfway up toward but not to include the scapula.

## 2016-05-15 NOTE — ED Notes (Signed)
[facility] not available.  [facility] notified of need for pt transport.

## 2016-05-15 NOTE — ED Notes (Signed)
Critical Lab Value:  Na- 119 Troponin- 0.07  Physician notified

## 2016-05-15 NOTE — ED Notes (Signed)
Patient placed on O2 via [place] at 2 LPM.

## 2016-05-16 ENCOUNTER — Ambulatory Visit: Payer: Medicare Other | Admitting: Family

## 2016-05-16 DIAGNOSIS — J9601 Acute respiratory failure with hypoxia: Secondary | ICD-10-CM

## 2016-05-16 DIAGNOSIS — E871 Hypo-osmolality and hyponatremia: Secondary | ICD-10-CM

## 2016-05-16 DIAGNOSIS — I214 Non-ST elevation (NSTEMI) myocardial infarction: Secondary | ICD-10-CM

## 2016-05-16 LAB — BASIC METABOLIC PANEL
ANION GAP: 12 (ref 5–15)
ANION GAP: 16 — AB (ref 5–15)
ANION GAP: 18 — AB (ref 5–15)
Anion gap: 12 (ref 5–15)
BUN: 20 mg/dL (ref 6–20)
BUN: 21 mg/dL — ABNORMAL HIGH (ref 6–20)
BUN: 28 mg/dL — ABNORMAL HIGH (ref 6–20)
BUN: 31 mg/dL — ABNORMAL HIGH (ref 6–20)
CHLORIDE: 81 mmol/L — AB (ref 101–111)
CHLORIDE: 81 mmol/L — AB (ref 101–111)
CHLORIDE: 86 mmol/L — AB (ref 101–111)
CHLORIDE: 87 mmol/L — AB (ref 101–111)
CO2: 20 mmol/L — ABNORMAL LOW (ref 22–32)
CO2: 22 mmol/L (ref 22–32)
CO2: 24 mmol/L (ref 22–32)
CO2: 25 mmol/L (ref 22–32)
Calcium: 8.8 mg/dL — ABNORMAL LOW (ref 8.9–10.3)
Calcium: 8.9 mg/dL (ref 8.9–10.3)
Calcium: 9 mg/dL (ref 8.9–10.3)
Calcium: 9.4 mg/dL (ref 8.9–10.3)
Creatinine, Ser: 0.96 mg/dL (ref 0.44–1.00)
Creatinine, Ser: 0.99 mg/dL (ref 0.44–1.00)
Creatinine, Ser: 1 mg/dL (ref 0.44–1.00)
Creatinine, Ser: 1.01 mg/dL — ABNORMAL HIGH (ref 0.44–1.00)
GFR calc Af Amer: 58 mL/min — ABNORMAL LOW (ref >60)
GFR calc Af Amer: 58 mL/min — ABNORMAL LOW (ref >60)
GFR calc Af Amer: 59 mL/min — ABNORMAL LOW (ref >60)
GFR calc Af Amer: >60 mL/min (ref >60)
GFR calc non Af Amer: 50 mL/min — ABNORMAL LOW (ref >60)
GFR calc non Af Amer: 50 mL/min — ABNORMAL LOW (ref >60)
GFR calc non Af Amer: 51 mL/min — ABNORMAL LOW (ref >60)
GFR, EST NON AFRICAN AMERICAN: 53 mL/min — AB (ref >60)
GLUCOSE: 134 mg/dL — AB (ref 65–99)
GLUCOSE: 143 mg/dL — AB (ref 65–99)
GLUCOSE: 313 mg/dL — AB (ref 65–99)
Glucose, Bld: 230 mg/dL — ABNORMAL HIGH (ref 65–99)
POTASSIUM: 3.2 mmol/L — AB (ref 3.5–5.1)
POTASSIUM: 3.4 mmol/L — AB (ref 3.5–5.1)
POTASSIUM: 4 mmol/L (ref 3.5–5.1)
POTASSIUM: 4.3 mmol/L (ref 3.5–5.1)
SODIUM: 122 mmol/L — AB (ref 135–145)
Sodium: 119 mmol/L — CL (ref 135–145)
Sodium: 119 mmol/L — CL (ref 135–145)
Sodium: 124 mmol/L — ABNORMAL LOW (ref 135–145)

## 2016-05-16 LAB — CBC
HCT: 34.2 % — ABNORMAL LOW (ref 36.0–46.0)
HEMATOCRIT: 33.8 % — AB (ref 36.0–46.0)
HEMOGLOBIN: 12.1 g/dL (ref 12.0–15.0)
HEMOGLOBIN: 12.1 g/dL (ref 12.0–15.0)
MCH: 30.6 pg (ref 26.0–34.0)
MCH: 30.3 pg (ref 26.0–34.0)
MCHC: 35.4 g/dL (ref 30.0–36.0)
MCHC: 35.8 g/dL (ref 30.0–36.0)
MCV: 85.4 fL (ref 78.0–100.0)
MCV: 85.5 fL (ref 78.0–100.0)
Platelets: 218 10*3/uL (ref 150–400)
Platelets: 245 10*3/uL (ref 150–400)
RBC: 3.96 MIL/uL (ref 3.87–5.11)
RBC: 4 MIL/uL (ref 3.87–5.11)
RDW: 13 % (ref 11.5–15.5)
RDW: 13.1 % (ref 11.5–15.5)
WBC: 4.7 10*3/uL (ref 4.0–10.5)
WBC: 5.2 10*3/uL (ref 4.0–10.5)

## 2016-05-16 LAB — TSH: TSH: 0.95 u[IU]/mL (ref 0.350–4.500)

## 2016-05-16 LAB — HEPARIN LEVEL (UNFRACTIONATED): Heparin Unfractionated: 0.28 IU/mL — ABNORMAL LOW (ref 0.30–0.70)

## 2016-05-16 LAB — TROPONIN I
TROPONIN I: 0.09 ng/mL — AB (ref <0.03)
TROPONIN I: 0.16 ng/mL — AB (ref <0.03)
TROPONIN I: 0.41 ng/mL — AB (ref <0.03)

## 2016-05-16 LAB — CORTISOL: CORTISOL PLASMA: 4.1 ug/dL

## 2016-05-16 LAB — MRSA PCR SCREENING: MRSA by PCR: NEGATIVE

## 2016-05-16 LAB — INFLUENZA PANEL BY PCR (TYPE A & B)
INFLBPCR: NEGATIVE
Influenza A By PCR: POSITIVE — AB

## 2016-05-16 LAB — URIC ACID: Uric Acid, Serum: 5.6 mg/dL (ref 2.3–6.6)

## 2016-05-16 MED ORDER — ARTIFICIAL TEARS OP OINT
TOPICAL_OINTMENT | Freq: Three times a day (TID) | OPHTHALMIC | Status: DC
  Administered 2016-05-16 – 2016-05-17 (×4): via OPHTHALMIC
  Administered 2016-05-18: 1 via OPHTHALMIC
  Administered 2016-05-18 – 2016-05-20 (×4): via OPHTHALMIC
  Filled 2016-05-16: qty 3.5

## 2016-05-16 MED ORDER — POTASSIUM CHLORIDE CRYS ER 20 MEQ PO TBCR
40.0000 meq | EXTENDED_RELEASE_TABLET | Freq: Once | ORAL | Status: AC
  Administered 2016-05-16: 40 meq via ORAL
  Filled 2016-05-16: qty 2

## 2016-05-16 MED ORDER — ASPIRIN EC 81 MG PO TBEC: 324.0000 mg | DELAYED_RELEASE_TABLET | Freq: Once | ORAL | Status: DC

## 2016-05-16 MED ORDER — HEPARIN (PORCINE) IN NACL 100-0.45 UNIT/ML-% IJ SOLN
700.0000 [IU]/h | INTRAMUSCULAR | Status: DC
  Administered 2016-05-16: 750 [IU]/h via INTRAVENOUS
  Filled 2016-05-16 (×3): qty 250

## 2016-05-16 MED ORDER — HEPARIN BOLUS VIA INFUSION
3000.0000 [IU] | Freq: Once | INTRAVENOUS | Status: AC
  Administered 2016-05-16: 3000 [IU] via INTRAVENOUS
  Filled 2016-05-16: qty 3000

## 2016-05-16 MED ORDER — ASPIRIN 81 MG PO CHEW
324.0000 mg | CHEWABLE_TABLET | Freq: Once | ORAL | Status: AC
Start: 2016-05-16 — End: 2016-05-16
  Administered 2016-05-16: 324 mg via ORAL
  Filled 2016-05-16: qty 4

## 2016-05-16 NOTE — Plan of Care (Signed)
Problem: Education: Goal: Knowledge of [facility] Education information/materials will improve Outcome: Progressing Nurse and patient discussed watching fluid intact by mouth with some teach back displayed

## 2016-05-16 NOTE — ED Notes (Signed)
Dr. Raliegh Ip notified of increased troponin, verbal order for ASA, pt moved onto inpt bed. Pt appears much more comfortable.

## 2016-05-16 NOTE — Progress Notes (Signed)
ANTICOAGULATION CONSULT NOTE - Follow Up Consult  Pharmacy Consult for Heparin Indication: chest pain/ACS  Allergies  Allergen Reactions  . Lisinopril   . Lunesta [Eszopiclone]   . Simvastatin   . Statins     (Causes weakness in legs and decreases ability to walk.  (lipitor crestor)  . Ultram [Tramadol]     Patient Measurements: Height: 5\' 1"  (154.9 cm) Weight: 141 lb 8.6 oz (64.2 kg) IBW/kg (Calculated) : 47.8 Heparin Dosing Weight: 61 kg  Vital Signs: Temp: 97.8 F (36.6 C) (01/22 1340) Temp Source: Oral (01/22 1340) BP: 124/63 (01/22 1340) Pulse Rate: 91 (01/22 1340)  Labs:  Recent Labs  05/15/16 1603  05/16/16 0007 05/16/16 0352 05/16/16 1018 05/16/16 1428  HGB 12.8  --  12.1 12.1  --   --   HCT 35.5*  --  34.2* 33.8*  --   --   PLT 201  --  218 245  --   --   HEPARINUNFRC  --   --   --   --   --  0.28*  CREATININE 0.67  < > 0.99 1.01* 0.96  --   TROPONINI  --   < > 0.09* 0.16* 0.41*  --   < > = values in this interval not displayed.  Estimated Creatinine Clearance: 37.5 mL/min (by C-G formula based on SCr of 0.96 mg/dL).   Medications:  Heparin @ 750 units/hr (7.5 ml/hr)  Assessment: 24 YOF who presented on 1/21 with SOB and was noted to have elevated troponins. Pharmacy has been consulted for heparin for anticoagulation while r/o ACS.  The patient's heparin level this afternoon is slightly SUBtherapeutic (HL 0.28, goal of 0.3-0.7). CBC okay this AM. No bleeding noted.   Goal of Therapy:  Heparin level 0.3-0.7 units/ml Monitor platelets by anticoagulation protocol: Yes   Plan:  1. Increase heparin to 850 units/hr (8.5 ml/hr) 2. Will continue to monitor for any signs/symptoms of bleeding and will follow up with heparin level in 8 hours   Thank you for allowing pharmacy to be a part of this patient's care.  Alycia Rossetti, PharmD, BCPS Clinical Pharmacist Pager: [phone] 05/16/2016 3:17 PM

## 2016-05-16 NOTE — Consult Note (Signed)
Patient ID: [name] MRN: [id number], DOB/AGE: 1930-09-12   Admit date: 05/15/2016   Reason for Consult: Elevated Troponin Requesting MD: Dr. Hartford Poli, Internal Medicine    Primary Physician: Anthoney Harada, MD Primary Cardiologist: Dr. Percival Spanish   Pt. Profile:  83 y/o female with h/o CAD s/p remote CABG, bilateral carotid stenosis, HTN and HLD admitted for acute respiratory failure in the setting of presumed bronchitis, as well as for management of hyponatremia w/ Na of 119. Also found to have an abnormal EKG and elevated troponin's x 3.   Problem List  Past Medical History:  Diagnosis Date  . Asthma   . CAD (coronary artery disease)   . Carotid stenosis    40-59% bilateral  . Chronic pain syndrome   . GERD (gastroesophageal reflux disease)   . HLD (hyperlipidemia)   . HTN (hypertension)   . Skin cancer     Past Surgical History:  Procedure Laterality Date  . ABDOMINAL HYSTERECTOMY  1974  . baldder resuspension  1988  . CORONARY ARTERY BYPASS GRAFT  1996   LIMA to LAD, SVG to PDA and SVG to D1   . CORONARY ARTERY BYPASS GRAFT  2010   cath 2 vessel CAD with 2 of 3 bypass grafts occluded. had a patent LIMA to LAD  . skin cancer resected    . TONSILLECTOMY       Allergies  Allergies  Allergen Reactions  . Lisinopril   . Lunesta [Eszopiclone]   . Simvastatin   . Statins     (Causes weakness in legs and decreases ability to walk.  (lipitor crestor)  . Ultram [Tramadol]     HPI  83 y/o female with h/o CAD s/p CABG in 1997. Her last LHC was in 2010. She was noted to have severe three-vessel coronary artery disease with 2/3 grafts occluded (SVG-PDA and SVG- Diag occluded). Her LIMA-LAD was widely patent.  EF was normal. She also has a h/o HTN, HLD and bilateral carotid artery stenosis. Doppler study 05/2014 showed heterogeneous calcified plaque, bilaterally with 40-59% bilateral ICA stenosis. She was being followed by Dr. Percival Spanish but not seen since 2016.    She presented to the ED 05/15/16 with complaint of shortness of breath and wheezing. CXR was unremarkable. EKG showed sinus tach with diffuse ST depressions. BMP showed severe hyponatremia with Na of 119. Of note, she is on Maxzide for HTN as an outpatient. She was admitted by IM for acute respiratory failure, from presumed bronchitis as well as for management for her hyponatremia. Patient was started on nebulizer treatment, steroids and antibiotics. Maxzide discontinued. IM conducting w/o for hyponatremia. TSH, cortisol lvels, urine for sodium osmolality, uric acid and serum osmolality pending. Of note, she has also had poorly controlled blood glucose levels in the 200 and 300 range.   Troponins were cycled given abnormal EKG. Troponins are abnormal x 3. 0.09>>0.16>>0.41. SCr is WNL at 0.96. Na improved today at 122. She is on IV heparin, along with ASA, metoprolol, Imdur and amlodipine.   She denies anterior chest pain but notes left arm and shoulder pain, however she contributes this a presumed rotator cuff tear from a fall she had a while back. She states that she is independent and performs all of her house hold chores on her own w/o much limitation. She denies exertional CP and dyspnea.    Home Medications  Prior to Admission medications   Medication Sig Start Date End Date Taking? Authorizing Provider  amLODipine (NORVASC) 10  MG tablet Take 1 tablet (10 mg total) by mouth daily. 04/22/16  Yes Sharion Balloon, FNP  aspirin EC 81 MG tablet Take 81 mg by mouth daily.   Yes Historical Provider, MD  azithromycin (ZITHROMAX) 250 MG tablet Take 1 tablet (250 mg total) by mouth daily. Take first 2 tablets together, then 1 every day until finished. 05/12/16  Yes Lysbeth Penner, FNP  benzonatate (TESSALON) 100 MG capsule Take 1 capsule (100 mg total) by mouth every 8 (eight) hours. 05/12/16  Yes Lysbeth Penner, FNP  Calcium Carbonate-Vitamin D (CALCIUM 600/VITAMIN D) 600-400 MG-UNIT per tablet Take  1 tablet by mouth 2 (two) times daily.     Yes Historical Provider, MD  cholecalciferol (VITAMIN D) 400 units TABS tablet Take 400 Units by mouth daily.   Yes Historical Provider, MD  citalopram (CELEXA) 20 MG tablet TAKE 1 TABLET (20 MG TOTAL) BY MOUTH DAILY. 04/22/16  Yes Sharion Balloon, FNP  docusate sodium (COLACE) 100 MG capsule Take 100 mg by mouth daily.   Yes Historical Provider, MD  fluticasone (FLONASE) 50 MCG/ACT nasal spray PLACE 2 SPRAYS INTO THE NOSE DAILY 04/24/16  Yes Sharion Balloon, FNP  guaiFENesin (MUCINEX) 600 MG 12 hr tablet Take 600 mg by mouth.   Yes Historical Provider, MD  HYDROcodone-acetaminophen (NORCO/VICODIN) 5-325 MG tablet Take 1 tablet by mouth every 4 (four) hours as needed. 05/12/16  Yes Lysbeth Penner, FNP  hydroxypropyl methylcellulose / hypromellose (ISOPTO TEARS / GONIOVISC) 2.5 % ophthalmic solution Place 1 drop into both eyes as needed for dry eyes.   Yes Historical Provider, MD  isosorbide mononitrate (IMDUR) 60 MG 24 hr tablet TAKE 1 TABLET (60 MG TOTAL) BY MOUTH DAILY. 04/22/16  Yes Sharion Balloon, FNP  loratadine (CLARITIN) 10 MG tablet Take 10 mg by mouth daily.     Yes Historical Provider, MD  Melatonin-Pyridoxine (MELATIN) 3-1 MG TABS Take 2 tablets by mouth at bedtime.     Yes Historical Provider, MD  metoprolol succinate (TOPROL-XL) 50 MG 24 hr tablet TAKE 1 TABLET (50 MG TOTAL) BY MOUTH DAILY. 04/22/16  Yes Sharion Balloon, FNP  Multiple Vitamin (MULTIVITAMIN) tablet Take 1 tablet by mouth 2 (two) times daily.    Yes Historical Provider, MD  Omega-3 Fatty Acids (FISH OIL) 1000 MG CAPS Take 2 capsules by mouth 2 (two) times daily.     Yes Historical Provider, MD  omeprazole (PRILOSEC) 20 MG capsule TAKE 1 CAPSULE (20 MG TOTAL) BY MOUTH 2 (TWO) TIMES DAILY BEFORE A MEAL. 04/22/16  Yes Sharion Balloon, FNP  potassium chloride (MICRO-K) 10 MEQ CR capsule TAKE 1 CAPSULE (10 MEQ TOTAL) BY MOUTH DAILY. 04/22/16  Yes Sharion Balloon, FNP    triamterene-hydrochlorothiazide (MAXZIDE) 75-50 MG tablet TAKE 1 TABLET BY MOUTH DAILY. 04/24/16  Yes Sharion Balloon, FNP  vitamin C (ASCORBIC ACID) 500 MG tablet Take 1,000 mg by mouth 2 (two) times daily.     Yes Historical Provider, MD  acetaminophen-codeine (TYLENOL #3) 300-30 MG tablet TAKE 2 TABLETS AT BEDTIME AS NEEDED Patient not taking: Reported on 05/15/2016 04/22/16   Sharion Balloon, FNP  montelukast (SINGULAIR) 10 MG tablet TAKE 1 TABLET (10 MG TOTAL) BY MOUTH AT BEDTIME. Patient not taking: Reported on 05/15/2016 04/22/16   Sharion Balloon, FNP  promethazine (PHENERGAN) 25 MG tablet Take 2 tablets (50 mg total) by mouth at bedtime. Patient not taking: Reported on 05/15/2016 04/22/16   Sharion Balloon, FNP  triamterene-hydrochlorothiazide (MAXZIDE-25) 37.5-25 MG tablet Take 1 tablet by mouth daily. Patient not taking: Reported on 05/15/2016 04/22/16   Sharion Balloon, [place]  . amLODipine  10 mg Oral Daily  . artificial tears   Both Eyes Q8H  . aspirin EC  81 mg Oral Daily  . budesonide (PULMICORT) nebulizer solution  0.25 mg Nebulization BID  . calcium-vitamin D  1 tablet Oral BID WC  . citalopram  20 mg Oral Daily  . docusate sodium  100 mg Oral Daily  . doxycycline (VIBRAMYCIN) IV  100 mg Intravenous Q12H  . fluticasone  2 spray Each Nare Daily  . guaiFENesin  600 mg Oral BID  . isosorbide mononitrate  60 mg Oral Daily  . levalbuterol  1.25 mg Nebulization Q4H  . loratadine  10 mg Oral Daily  . methylPREDNISolone (SOLU-MEDROL) injection  40 mg Intravenous Q12H  . metoprolol succinate  50 mg Oral Daily  . multivitamin with minerals  1 tablet Oral BID  . omega-3 acid ethyl esters  2 capsule Oral BID WC  . oseltamivir  30 mg Oral BID  . pantoprazole  40 mg Oral Daily  . potassium chloride SA  20 mEq Oral Daily  . vitamin C  1,000 mg Oral BID    Family History  Family History  Problem Relation Age of Onset  . Heart disease Brother     multiple; ealry  onset  . Heart disease Sister     multiple; early onset   . Breast cancer Sister   . Colon cancer Sister   . Emphysema Daughter     Social History  Social History   Social History  . Marital status: Single    Spouse name: N/A  . Number of children: N/A  . Years of education: N/A   Occupational History  . retired Retired   Social History Main Topics  . Smoking status: Former Smoker    Packs/day: 2.00    Years: 20.00    Types: Cigarettes    Quit date: 10/25/1977  . Smokeless tobacco: Never Used     Comment: smoked 2ppd for 15 years; quit in 1979  . Alcohol use No  . Drug use: No  . Sexual activity: Not on file   Other Topics Concern  . 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      Review of Systems General:  No chills, fever, night sweats or weight changes.  Cardiovascular:  No chest pain, dyspnea on exertion, edema, orthopnea, palpitations, paroxysmal nocturnal dyspnea. Dermatological: No rash, lesions/masses Respiratory: No cough, dyspnea Urologic: No hematuria, dysuria Abdominal:   No nausea, vomiting, diarrhea, bright red blood per rectum, melena, or hematemesis Neurologic:  No visual changes, wkns, changes in mental status. All other systems reviewed and are otherwise negative except as noted above.  Physical Exam  Blood pressure 124/63, pulse 91, temperature 97.8 F (36.6 C), temperature source Oral, resp. rate 15, height 5\' 1"  (1.549 m), weight 141 lb 8.6 oz (64.2 kg), SpO2 100 %.  General: Pleasant, NAD, elderly  Psych: Normal affect. Neuro: Alert and oriented X 3. Moves all extremities spontaneously. HEENT: Normal  Neck: Supple without bruits or JVD. Lungs:  Resp regular and unlabored, bilateral crackles at the bases (Stable bilateral basilar scarring noted on CXR) Heart: RRR no s3, s4, or murmurs. Abdomen: Soft, non-tender, non-distended, BS + x 4.  Extremities:  No  clubbing, cyanosis or edema. DP/PT/Radials 2+ and equal bilaterally.  Labs  Troponin (Point of Care Test) No results for input(s): TROPIPOC in the last 72 hours.  Recent Labs  05/15/16 2036 05/16/16 0007 05/16/16 0352 05/16/16 1018  TROPONINI 0.07* 0.09* 0.16* 0.41*   Lab Results  Component Value Date   WBC 5.2 05/16/2016   HGB 12.1 05/16/2016   HCT 33.8 (L) 05/16/2016   MCV 85.4 05/16/2016   PLT 245 05/16/2016    Recent Labs Lab 05/16/16 1018  NA 122*  K 4.0  CL 86*  CO2 24  BUN 28*  CREATININE 0.96  CALCIUM 9.0  GLUCOSE 143*   Lab Results  Component Value Date   CHOL 333 (H) 08/15/2013   HDL 75 08/15/2013   LDLCALC 231 (H) 08/15/2013   TRIG 137 08/15/2013   No results found for: DDIMER   Radiology/Studies  Dg Chest 2 View  Result Date: 05/15/2016 CLINICAL DATA:  Left arm pain starting 3 weeks ago EXAM: CHEST  2 VIEW COMPARISON:  05/12/2016 FINDINGS: Cardiomediastinal silhouette is stable. Coarse interstitial prominence bilaterally again noted. Status post median sternotomy. Stable bilateral basilar scarring. No infiltrate or pulmonary edema. Again noted significant degenerative changes left shoulder. Again there is sclerosis of left glenoid and humeral head with volume loss and deformity of the humeral head. 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 There is anterior inferior subluxation of humeral head from glenohumeral joint. IMPRESSION: Coarse interstitial prominence bilaterally again noted. Status post median sternotomy. Stable bilateral basilar scarring. No infiltrate or pulmonary edema. Again noted significant degenerative changes left shoulder. Again there is sclerosis of left glenoid and humeral head with volume loss and deformity of the humeral head. 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 There is anterior inferior subluxation of humeral head from glenohumeral joint. Electronically Signed   By: Lahoma Crocker M.D.   On: 05/15/2016 17:42     Dg Chest 2 View  Result Date: 05/12/2016 CLINICAL DATA:  Nonproductive cough. EXAM: CHEST  2 VIEW COMPARISON:  August 21, 2012 FINDINGS: Interval increase in coarsened interstitial markings consistent with progression of the suspected interstitial lung disease seen on not high contrast CT of the chest from July 2014. No nodules or masses. No focal infiltrates. The cardiomediastinal silhouette is stable. No other acute abnormalities. 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 IMPRESSION: Increased coarsening of the bilateral lung interstitium suggests worsening of the interstitial lung disease seen in July 2014. It would be difficult to completely exclude a subtle superimposed infectious process but no definitive focal infiltrate is identified. Electronically Signed   By: Dorise Bullion III M.D   On: 05/12/2016 19:08   Dg Shoulder Left  Result Date: 05/12/2016 CLINICAL DATA:  Right shoulder pain. History of fall 8 months ago. Initial encounter. EXAM: LEFT SHOULDER - 2+ VIEW COMPARISON:  None. FINDINGS: No acute bony or joint abnormality is identified. The humeral head is high-riding compatible with chronic rotator cuff tear. Several small loose bodies are identified subjacent to the coracoid process. There is mild acromioclavicular osteoarthritis. Prominent soft tissue attenuation above the acromion measures 2.6 cm transverse by 1.4 cm craniocaudal and is compatible with fluid. IMPRESSION: No acute abnormality. High-riding humeral head consistent with chronic rotator cuff tear. Subcutaneous soft tissue attenuation superior and lateral to the acromion is likely fluid which may have extravasated through the patient's rotator cuff tear could be due to adventitial bursitis. Nonemergent MRI could be used for further evaluation. Electronically Signed   By: Marcello Moores  Dalessio M.D.   On: 05/12/2016 19:05   Dg Knee Ap/lat W/sunrise Left  Result Date: 05/12/2016 CLINICAL DATA:  Pain for  1 month. EXAM: LEFT KNEE 3 VIEWS COMPARISON:  None. FINDINGS: No evidence of fracture, dislocation, or joint effusion. No evidence of arthropathy or other focal bone abnormality. Soft tissues are unremarkable. IMPRESSION: Negative. Electronically Signed   By: Dorise Bullion III M.D   On: 05/12/2016 19:05    ECG  Sinus tachycardia 134 bpm Probable LVH with secondary repol abnrm ST depression, probably rate related    ASSESSMENT AND PLAN  Principal Problem:   Acute respiratory failure with hypoxemia ([place]) Active Problems:   Essential hypertension, benign   CORONARY ARTERY BYPASS GRAFT, THREE VESSEL, HX OF   Asthma, chronic   Hyponatremia   Acute respiratory failure with hypoxia ([facility])   1. Acute Respiratory Failure w/ Hypoxia: felt to be secondary to ? Bronchitis. IM managing with steroids, antibiotics and Xopenex. Influenza panel pending.   2. Hyponatremia: initial Na was 119. In the setting of Maxide use for HTN, which has now been discontinued. Pt fluid restricted. IM conducting w/u. TSH, cortisol levels, urine for sodium osmolality and uric acid and serum osmolality pending.   3. CAD /NSTEMI s/p remote CABG. Last LHC in 2010 showed severe three-vessel coronary artery disease with 2/3 grafts occluded (SVG-PDA and SVG- Diag occluded). Her LIMA-LAD was widely patent.  EF was normal. Now with abnormal EKG (ST depressions w/ mild sinus tach w/ probable LVH) and abnormal troponin x 3 with upward trend 0.09>>0.16>>0.41. She denies CP. Suspect demand ischemia in the setting of known graft occlusion and stress from URI and hyponatremia. Given she is asymptomatic, it is reasonable to treat medically and to allow her to recover from her acute medical problems. We can reassess her as an outpatient to determine if any additional w/u is indicated. For now, continue IV heparin x 48 hrs. Recommend initiation of Plavix, 75 mg daily. Continue ASA and BB. She has prior h/o statin intolerance. Will avoid  statins.   4. HTN: controlled on current regimen. Continue metoprolol, amlodipine and Imdur. Maxide discontinued due to hyponatremia.   5. HLD: She has prior h/o statin intolerance. Refuses to restart.   Signed, Lyda Jester, PA-C 05/16/2016, 1:52 PM   Personally seen and examined. Agree with above.  Currently not having any chest discomfort. Right shoulder discomfort from fall previously. Her troponin has increased with ST segment depression noted on ECG. Non-ST elevation myocardial infarction-likely type II or demand ischemia. Very mild troponin elevation. This is likely being driven by her underlying lung process, bronchitis. She has been told in the past to limit her water intake which makes me think that hyponatremia has been an issue for her. I agree with stopping HCTZ.  - We will continue IV heparin over the next 24-48 hours  - Add Plavix 75 mg to her aspirin 81 mg  - I discussed with her the benefits of statin therapy and she does not wish to proceed. She has had weakness in the past.  - Continue beta blocker.  - Noninvasive management.  - If she ends up having anginal symptoms unrelated to her right rotator cuff, we could consider further testing as outpatient.  Candee Furbish, MD

## 2016-05-16 NOTE — Progress Notes (Signed)
PROGRESS NOTE  [name]  [id number] DOB: 27-Jun-1930 DOA: 05/15/2016 PCP: Anthoney Harada, MD Outpatient Specialists:  Subjective: Denies any new complaints, has some shortness of breath, wheezing and cough. Denies any fever or chills.  Brief Narrative:  [name] is a 83 y.o. female with CAD status post CABG, hypertension, hyperlipidemia who recently moved back to [facility] presents to the ER because of shortness of breath and wheezing over the last 1 week. Patient had gone to the urgent care center recently for bronchitis. In the patient is found to be wheezing and short of breath. Chest x-ray shows chronic findings. Patient denies any chest pain. EKG was showing sinus tachycardia with diffuse ST depression. Labs also revealed severe hyponatremia of 119. Patient states over the last 1 week since she was getting fatigue and weak was drinking more than usual water. Patient in addition also is on Maxzide. Patient is being admitted for acute respiratory failure most likely from bronchitis and severe hyponatremia.   Assessment & Plan:   Principal Problem:   Acute respiratory failure with hypoxemia (HCC) Active Problems:   Essential hypertension, benign   CORONARY ARTERY BYPASS GRAFT, THREE VESSEL, HX OF   Asthma, chronic   Hyponatremia   Acute respiratory failure with hypoxia (HCC)   Acute respiratory failure with hypoxia most likely from bronchitis  Patient is placed on steroids antibiotics and since patient is tachycardic will avoid albuterol and place patient on Xopenex.  Closely monitor in stepdown. Will keep patient on Tamiflu and check influenza PCR.  Severe hyponatremia Sodium is 119 on admission. Patient states she has been drinking more than usual water. Patient is also on Maxzide which will be discontinued.  Check TSH cortisol levels urine for sodium osmolality and uric acid and serum osmolality.  Continue fluid restriction, follow BMP every 4 hours if  not improved in the morning it might give low range of normal saline.  Elevated troponin Troponin was 0.07 admission, continued to increase, troponin 0.41 now. EKG showed sinus tachycardia and ST depression in V3-V4, start aspirin, beta blockers and Heparin Cardiology to evaluate.  CAD status post CABG  EKG shows ST depression. Will cycle cardiac markers and if positive will start heparin.  Patient is on Toprol and aspirin. Intolerant to statins.  Hypertension - discontinuing Maxzide due to hyponatremia. Patient is on Imdur and Toprol and amlodipine.  Left shoulder pain with possible rotator cuff tear - will need orthopedic referral.   DVT prophylaxis:  Code Status: Full Code Family Communication:  Disposition Plan:  Diet: Diet Heart Room service appropriate? Yes; Fluid consistency: Thin; Fluid restriction: Other (see comments)  Consultants:   None  Procedures:   None  Antimicrobials:   None  Objective: Vitals:   05/16/16 0800 05/16/16 0900 05/16/16 1000 05/16/16 1018  BP: (!) 148/61 (!) 121/55 129/60   Pulse: (!) 106 (!) 109 (!) 101   Resp: 17 17 (!) 21   Temp:      TempSrc:      SpO2: 95% 90% 97% 100%  Weight:      Height:        Intake/Output Summary (Last 24 hours) at 05/16/16 1242 Last data filed at 05/16/16 1038  Gross per 24 hour  Intake           441.63 ml  Output                0 ml  Net           441.63  ml   Filed Weights   05/15/16 1534 05/16/16 0644  Weight: 63 kg (139 lb) 64.2 kg (141 lb 8.6 oz)    Examination: General exam: Appears calm and comfortable  Respiratory system: Clear to auscultation. Respiratory effort normal. Cardiovascular system: S1 & S2 heard, RRR. No JVD, murmurs, rubs, gallops or clicks. No pedal edema. Gastrointestinal system: Abdomen is nondistended, soft and nontender. No organomegaly or masses felt. Normal bowel sounds heard. Central nervous system: Alert and oriented. No focal neurological deficits. Extremities:  Symmetric 5 x 5 power. Skin: No rashes, lesions or ulcers Psychiatry: Judgement and insight appear normal. Mood & affect appropriate.   Data Reviewed: I have personally reviewed following labs and imaging studies  CBC:  Recent Labs Lab 05/15/16 1603 05/16/16 0007 05/16/16 0352  WBC 6.5 4.7 5.2  NEUTROABS 5.6  --   --   HGB 12.8 12.1 12.1  HCT 35.5* 34.2* 33.8*  MCV 85.5 85.5 85.4  PLT 201 218 [id number]   Basic Metabolic Panel:  Recent Labs Lab 05/15/16 1603 05/15/16 2036 05/16/16 0007 05/16/16 0352 05/16/16 1018  NA 120* 119* 119* 119* 122*  K 3.0* 3.2* 3.2* 3.4* 4.0  CL 82* 83* 81* 81* 86*  CO2 26 26 22  20* 24  GLUCOSE 117* 255* 313* 230* 143*  BUN 16 17 20  21* 28*  CREATININE 0.67 0.78 0.99 1.01* 0.96  CALCIUM 8.6* 8.7* 8.8* 8.9 9.0   GFR: Estimated Creatinine Clearance: 37.5 mL/min (by C-G formula based on SCr of 0.96 mg/dL). Liver Function Tests: No results for input(s): AST, ALT, ALKPHOS, BILITOT, PROT, ALBUMIN in the last 168 hours. No results for input(s): LIPASE, AMYLASE in the last 168 hours. No results for input(s): AMMONIA in the last 168 hours. Coagulation Profile: No results for input(s): INR, PROTIME in the last 168 hours. Cardiac Enzymes:  Recent Labs Lab 05/15/16 2036 05/16/16 0007 05/16/16 0352 05/16/16 1018  TROPONINI 0.07* 0.09* 0.16* 0.41*   BNP (last 3 results) No results for input(s): PROBNP in the last 8760 hours. HbA1C: No results for input(s): HGBA1C in the last 72 hours. CBG: No results for input(s): GLUCAP in the last 168 hours. Lipid Profile: No results for input(s): CHOL, HDL, LDLCALC, TRIG, CHOLHDL, LDLDIRECT in the last 72 hours. Thyroid Function Tests:  Recent Labs  05/16/16 0007  TSH 0.950   Anemia Panel: No results for input(s): VITAMINB12, FOLATE, FERRITIN, TIBC, IRON, RETICCTPCT in the last 72 hours. Urine analysis:    Component Value Date/Time   APPEARANCEUR Clear 03/07/2016 1428   GLUCOSEU Negative 03/07/2016  1428   BILIRUBINUR Negative 03/07/2016 1428   PROTEINUR Negative 03/07/2016 1428   UROBILINOGEN negative 09/25/2013 1632   NITRITE Negative 03/07/2016 1428   LEUKOCYTESUR 3+ (A) 03/07/2016 1428   Sepsis Labs: @LABRCNTIP (procalcitonin:4,lacticidven:4)  ) Recent Results (from the past 240 hour(s))  MRSA PCR Screening     Status: None   Collection Time: 05/16/16  6:52 AM  Result Value Ref Range Status   MRSA by PCR NEGATIVE NEGATIVE Final    Comment:        The GeneXpert MRSA Assay (FDA approved for NASAL specimens only), is one component of a comprehensive MRSA colonization surveillance program. It is not intended to diagnose MRSA infection nor to guide or monitor treatment for MRSA infections.      Invalid input(s): PROCALCITONIN, LACTICACIDVEN   Radiology Studies: Dg Chest 2 View  Result Date: 05/15/2016 CLINICAL DATA:  Left arm pain starting 3 weeks ago EXAM: CHEST  2 VIEW  COMPARISON:  05/12/2016 FINDINGS: Cardiomediastinal silhouette is stable. Coarse interstitial prominence bilaterally again noted. Status post median sternotomy. Stable bilateral basilar scarring. No infiltrate or pulmonary edema. Again noted significant degenerative changes left shoulder. Again there is sclerosis of left glenoid and humeral head with volume loss and deformity of the humeral head. 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 There is anterior inferior subluxation of humeral head from glenohumeral joint. IMPRESSION: Coarse interstitial prominence bilaterally again noted. Status post median sternotomy. Stable bilateral basilar scarring. No infiltrate or pulmonary edema. Again noted significant degenerative changes left shoulder. Again there is sclerosis of left glenoid and humeral head with volume loss and deformity of the humeral head. 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 There is anterior inferior subluxation of humeral head from glenohumeral joint. Electronically Signed    By: Lahoma Crocker M.D.   On: 05/15/2016 17:42        Scheduled Meds: . amLODipine  10 mg Oral Daily  . aspirin EC  81 mg Oral Daily  . budesonide (PULMICORT) nebulizer solution  0.25 mg Nebulization BID  . calcium-vitamin D  1 tablet Oral BID WC  . citalopram  20 mg Oral Daily  . docusate sodium  100 mg Oral Daily  . doxycycline (VIBRAMYCIN) IV  100 mg Intravenous Q12H  . fluticasone  2 spray Each Nare Daily  . guaiFENesin  600 mg Oral BID  . isosorbide mononitrate  60 mg Oral Daily  . levalbuterol  1.25 mg Nebulization Q4H  . loratadine  10 mg Oral Daily  . methylPREDNISolone (SOLU-MEDROL) injection  40 mg Intravenous Q12H  . metoprolol succinate  50 mg Oral Daily  . multivitamin with minerals  1 tablet Oral BID  . omega-3 acid ethyl esters  2 capsule Oral BID WC  . oseltamivir  30 mg Oral BID  . pantoprazole  40 mg Oral Daily  . potassium chloride SA  20 mEq Oral Daily  . vitamin C  1,000 mg Oral BID   Continuous Infusions: . heparin 750 Units/hr (05/16/16 0627)     LOS: 1 day    Time spent: 35 minutes    Venson Ferencz A, MD [facility] Pager [phone]  If 7PM-7AM, please contact night-coverage [email or web address] Password [facility] 05/16/2016, 12:42 PM

## 2016-05-16 NOTE — ED Notes (Signed)
Repeat call to pharmacy for Heparin

## 2016-05-16 NOTE — ED Notes (Signed)
Awaiting Heparin to arrive from pharmacy

## 2016-05-16 NOTE — Progress Notes (Signed)
ANTICOAGULATION CONSULT NOTE - Initial Consult  Pharmacy Consult for Heparin Indication: chest pain/ACS  Allergies  Allergen Reactions  . Lisinopril   . Lunesta [Eszopiclone]   . Simvastatin   . Statins     (Causes weakness in legs and decreases ability to walk.  (lipitor crestor)  . Ultram [Tramadol]     Patient Measurements: Height: 5\' 1"  (154.9 cm) Weight: 139 lb (63 kg) IBW/kg (Calculated) : 47.8 Heparin Dosing Weight: 60 kg  Vital Signs: BP: 147/61 (01/22 0515) Pulse Rate: 106 (01/22 0515)  Labs:  Recent Labs  05/15/16 1603 05/15/16 2036 05/16/16 0007 05/16/16 0352  HGB 12.8  --  12.1 12.1  HCT 35.5*  --  34.2* 33.8*  PLT 201  --  218 245  CREATININE 0.67 0.78 0.99 1.01*  TROPONINI  --  0.07* 0.09* 0.16*    Estimated Creatinine Clearance: 35.3 mL/min (by C-G formula based on SCr of 1.01 mg/dL (H)).   Medical History: Past Medical History:  Diagnosis Date  . Asthma   . CAD (coronary artery disease)   . Carotid stenosis    40-59% bilateral  . Chronic pain syndrome   . GERD (gastroesophageal reflux disease)   . HLD (hyperlipidemia)   . HTN (hypertension)   . Skin cancer     Medications:  Current Facility-Administered Medications on File Prior to Encounter  Medication Dose Route Frequency Provider Last Rate Last Dose  . [COMPLETED] dexamethasone (DECADRON) injection 6 mg  6 mg Intramuscular Once Janne Napoleon, NP   6 mg at 05/15/16 1348  . [COMPLETED] ipratropium-albuterol (DUONEB) 0.5-2.5 (3) MG/3ML nebulizer solution 3 mL  3 mL Nebulization Once Janne Napoleon, NP   3 mL at 05/15/16 1352  . [COMPLETED] ondansetron (ZOFRAN-ODT) disintegrating tablet 4 mg  4 mg Oral Once Janne Napoleon, NP   4 mg at 05/15/16 1404  . [DISCONTINUED] 0.9 %  sodium chloride infusion   Intravenous Once Janne Napoleon, NP   Stopped at 05/15/16 1454   Current Outpatient Prescriptions on File Prior to Encounter  Medication Sig Dispense Refill  . amLODipine (NORVASC) 10 MG tablet Take 1  tablet (10 mg total) by mouth daily. 90 tablet 2  . azithromycin (ZITHROMAX) 250 MG tablet Take 1 tablet (250 mg total) by mouth daily. Take first 2 tablets together, then 1 every day until finished. 6 tablet 0  . benzonatate (TESSALON) 100 MG capsule Take 1 capsule (100 mg total) by mouth every 8 (eight) hours. 21 capsule 0  . Calcium Carbonate-Vitamin D (CALCIUM 600/VITAMIN D) 600-400 MG-UNIT per tablet Take 1 tablet by mouth 2 (two) times daily.      . citalopram (CELEXA) 20 MG tablet TAKE 1 TABLET (20 MG TOTAL) BY MOUTH DAILY. 90 tablet 2  . fluticasone (FLONASE) 50 MCG/ACT nasal spray PLACE 2 SPRAYS INTO THE NOSE DAILY 48 g 1  . HYDROcodone-acetaminophen (NORCO/VICODIN) 5-325 MG tablet Take 1 tablet by mouth every 4 (four) hours as needed. 20 tablet 0  . isosorbide mononitrate (IMDUR) 60 MG 24 hr tablet TAKE 1 TABLET (60 MG TOTAL) BY MOUTH DAILY. 90 tablet 2  . loratadine (CLARITIN) 10 MG tablet Take 10 mg by mouth daily.      . Melatonin-Pyridoxine (MELATIN) 3-1 MG TABS Take 2 tablets by mouth at bedtime.      . metoprolol succinate (TOPROL-XL) 50 MG 24 hr tablet TAKE 1 TABLET (50 MG TOTAL) BY MOUTH DAILY. 90 tablet 2  . Multiple Vitamin (MULTIVITAMIN) tablet Take 1 tablet by mouth 2 (  two) times daily.     . Omega-3 Fatty Acids (FISH OIL) 1000 MG CAPS Take 2 capsules by mouth 2 (two) times daily.      Marland Kitchen omeprazole (PRILOSEC) 20 MG capsule TAKE 1 CAPSULE (20 MG TOTAL) BY MOUTH 2 (TWO) TIMES DAILY BEFORE A MEAL. 180 capsule 1  . potassium chloride (MICRO-K) 10 MEQ CR capsule TAKE 1 CAPSULE (10 MEQ TOTAL) BY MOUTH DAILY. 90 capsule 2  . triamterene-hydrochlorothiazide (MAXZIDE) 75-50 MG tablet TAKE 1 TABLET BY MOUTH DAILY. 90 tablet 1  . vitamin C (ASCORBIC ACID) 500 MG tablet Take 1,000 mg by mouth 2 (two) times daily.      Marland Kitchen acetaminophen-codeine (TYLENOL #3) 300-30 MG tablet TAKE 2 TABLETS AT BEDTIME AS NEEDED (Patient not taking: Reported on 05/15/2016) 60 tablet 2  . montelukast (SINGULAIR)  10 MG tablet TAKE 1 TABLET (10 MG TOTAL) BY MOUTH AT BEDTIME. (Patient not taking: Reported on 05/15/2016) 90 tablet 3  . promethazine (PHENERGAN) 25 MG tablet Take 2 tablets (50 mg total) by mouth at bedtime. (Patient not taking: Reported on 05/15/2016) 60 tablet 2  . triamterene-hydrochlorothiazide (MAXZIDE-25) 37.5-25 MG tablet Take 1 tablet by mouth daily. (Patient not taking: Reported on 05/15/2016) 90 tablet 1     Assessment: 83 y.o. female with SOB, elevated cardiac markers, for heparin  Goal of Therapy:  Heparin level 0.3-0.7 units/ml Monitor platelets by anticoagulation protocol: Yes   Plan:  Heparin 3000 units IV bolus, then start heparin 750 units/hr Check heparin level in 8 hours.    Mackay Hanauer, Bronson Curb 05/16/2016,5:38 AM

## 2016-05-16 NOTE — Progress Notes (Signed)
Inpatient Diabetes Program Recommendations  AACE/ADA: New Consensus Statement on Inpatient Glycemic Control (2015)  Target Ranges:  Prepandial:   less than 140 mg/dL      Peak postprandial:   less than 180 mg/dL (1-2 hours)      Critically ill patients:  140 - 180 mg/dL   No results found for: GLUCAP, HGBA1C  Results for PAUL, MOGUL (MRN [id number]) as of 05/16/2016 10:01  Ref. Range 05/15/2016 16:03 05/15/2016 20:36 05/16/2016 00:07 05/16/2016 03:52  Glucose Latest Ref Range: 65 - 99 mg/dL 117 (H) 255 (H) 313 (H) 230 (H)    Review of Glycemic Control  Diabetes history: none, steroids this admission Outpatient Diabetes medications: none Current orders for Inpatient glycemic control: none  Inpatient Diabetes Program Recommendations:    Due to increased CBG's with steroids, please consider sensitive correction Novolog 0-9 units TIDAC.     Per ADA recommendations "consider performing an A1C on all patients with diabetes or hyperglycemia admitted to the hospital if not performed in the prior 3 months".  Thank you,  Windy Carina, RN, MSN Diabetes Coordinator Inpatient Diabetes Program [phone] (Team Pager)

## 2016-05-16 NOTE — ED Notes (Signed)
Critical Lab Value, physician notified   Sodium: 119

## 2016-05-17 DIAGNOSIS — J441 Chronic obstructive pulmonary disease with (acute) exacerbation: Secondary | ICD-10-CM

## 2016-05-17 LAB — BASIC METABOLIC PANEL
Anion gap: 12 (ref 5–15)
Anion gap: 11 (ref 5–15)
Anion gap: 12 (ref 5–15)
BUN: 35 mg/dL — ABNORMAL HIGH (ref 6–20)
BUN: 34 mg/dL — ABNORMAL HIGH (ref 6–20)
BUN: 37 mg/dL — ABNORMAL HIGH (ref 6–20)
CALCIUM: 9.3 mg/dL (ref 8.9–10.3)
CALCIUM: 9.1 mg/dL (ref 8.9–10.3)
CHLORIDE: 93 mmol/L — AB (ref 101–111)
CO2: 25 mmol/L (ref 22–32)
CO2: 23 mmol/L (ref 22–32)
CO2: 23 mmol/L (ref 22–32)
CREATININE: 0.86 mg/dL (ref 0.44–1.00)
CREATININE: 1.09 mg/dL — AB (ref 0.44–1.00)
Calcium: 9.3 mg/dL (ref 8.9–10.3)
Chloride: 91 mmol/L — ABNORMAL LOW (ref 101–111)
Chloride: 92 mmol/L — ABNORMAL LOW (ref 101–111)
Creatinine, Ser: 0.95 mg/dL (ref 0.44–1.00)
GFR calc Af Amer: >60 mL/min (ref >60)
GFR calc non Af Amer: >60 mL/min (ref >60)
GFR, EST AFRICAN AMERICAN: 52 mL/min — AB (ref >60)
GFR, EST NON AFRICAN AMERICAN: 53 mL/min — AB (ref >60)
GFR, EST NON AFRICAN AMERICAN: 45 mL/min — AB (ref >60)
GLUCOSE: 132 mg/dL — AB (ref 65–99)
Glucose, Bld: 99 mg/dL (ref 65–99)
Glucose, Bld: 123 mg/dL — ABNORMAL HIGH (ref 65–99)
Potassium: 4.6 mmol/L (ref 3.5–5.1)
Potassium: 4.6 mmol/L (ref 3.5–5.1)
Potassium: 5.1 mmol/L (ref 3.5–5.1)
SODIUM: 127 mmol/L — AB (ref 135–145)
SODIUM: 127 mmol/L — AB (ref 135–145)
Sodium: 128 mmol/L — ABNORMAL LOW (ref 135–145)

## 2016-05-17 LAB — HEPARIN LEVEL (UNFRACTIONATED)
HEPARIN UNFRACTIONATED: 0.68 [IU]/mL (ref 0.30–0.70)
Heparin Unfractionated: 0.6 IU/mL (ref 0.30–0.70)
Heparin Unfractionated: 0.75 IU/mL — ABNORMAL HIGH (ref 0.30–0.70)

## 2016-05-17 LAB — CBC
HCT: 33.1 % — ABNORMAL LOW (ref 36.0–46.0)
Hemoglobin: 11.4 g/dL — ABNORMAL LOW (ref 12.0–15.0)
MCH: 29.9 pg (ref 26.0–34.0)
MCHC: 34.4 g/dL (ref 30.0–36.0)
MCV: 86.9 fL (ref 78.0–100.0)
PLATELETS: 244 10*3/uL (ref 150–400)
RBC: 3.81 MIL/uL — ABNORMAL LOW (ref 3.87–5.11)
RDW: 13.2 % (ref 11.5–15.5)
WBC: 9.7 10*3/uL (ref 4.0–10.5)

## 2016-05-17 LAB — TROPONIN I: TROPONIN I: 0.3 ng/mL — AB (ref <0.03)

## 2016-05-17 MED ORDER — LEVALBUTEROL HCL 1.25 MG/0.5ML IN NEBU
1.2500 mg | INHALATION_SOLUTION | Freq: Two times a day (BID) | RESPIRATORY_TRACT | Status: DC
  Administered 2016-05-17 – 2016-05-20 (×6): 1.25 mg via RESPIRATORY_TRACT
  Filled 2016-05-17 (×6): qty 0.5

## 2016-05-17 MED ORDER — POLYETHYLENE GLYCOL 3350 17 G PO PACK
17.0000 g | PACK | Freq: Every day | ORAL | Status: DC
  Administered 2016-05-17 – 2016-05-20 (×4): 17 g via ORAL
  Filled 2016-05-17 (×4): qty 1

## 2016-05-17 MED ORDER — LEVALBUTEROL HCL 1.25 MG/0.5ML IN NEBU
1.2500 mg | INHALATION_SOLUTION | Freq: Four times a day (QID) | RESPIRATORY_TRACT | Status: DC
  Administered 2016-05-17 (×2): 1.25 mg via RESPIRATORY_TRACT
  Filled 2016-05-17 (×2): qty 0.5

## 2016-05-17 NOTE — Plan of Care (Signed)
Problem: Education: Goal: Knowledge of [facility] Education information/materials will improve Outcome: Progressing Daughter was updated on plan of care

## 2016-05-17 NOTE — Progress Notes (Signed)
ANTICOAGULATION CONSULT NOTE - Follow Up Consult  Pharmacy Consult for Heparin Indication: chest pain/ACS  Allergies  Allergen Reactions  . Lisinopril   . Lunesta [Eszopiclone]   . Simvastatin   . Statins     (Causes weakness in legs and decreases ability to walk.  (lipitor crestor)  . Ultram [Tramadol]     Patient Measurements: Height: 5\' 1"  (154.9 cm) Weight: 133 lb 9.6 oz (60.6 kg) IBW/kg (Calculated) : 47.8 Heparin Dosing Weight: 61 kg  Vital Signs: Temp: 97.5 F (36.4 C) (01/23 1311) Temp Source: Oral (01/23 1311) BP: 114/52 (01/23 1311) Pulse Rate: 83 (01/23 1311)  Labs:  Recent Labs  05/16/16 0007 05/16/16 0352 05/16/16 1018 05/16/16 1421 05/16/16 1428 05/17/16 0437 05/17/16 1219  HGB 12.1 12.1  --   --   --  11.4*  --   HCT 34.2* 33.8*  --   --   --  33.1*  --   PLT 218 245  --   --   --  244  --   HEPARINUNFRC  --   --   --   --  0.28* 0.60 0.75*  CREATININE 0.99 1.01* 0.96 1.00  --  0.95 0.86  TROPONINI 0.09* 0.16* 0.41*  --   --  0.30*  --     Estimated Creatinine Clearance: 40.7 mL/min (by C-G formula based on SCr of 0.86 mg/dL).   Assessment: 74 YOF who presented on 1/21 with SOB and was noted to have elevated troponins. Pharmacy has been consulted for heparin for anticoagulation while r/o ACS. Heparin to be continued until tomorrow 1/24.   The patient's heparin level this afternoon is supratherapeutic on 850 units/hr (HL 0.75, goal of 0.3-0.7). Hgb/Hct slight drop this AM, plts wnl. Spoke with nurse and no bleeding or problems with infusion noted.   Goal of Therapy:  Heparin level 0.3-0.7 units/ml Monitor platelets by anticoagulation protocol: Yes   Plan:  1. Decrease heparin to 800 units/hr (8 ml/hr) 2. Confirmatory heparin level in 8 hours 3. Daily heparin level/CBC 4. Will continue to monitor for any signs/symptoms of bleeding   Thank you for allowing pharmacy to be a part of this patient's care.  Uvaldo Bristle, PharmD PGY1 Pharmacy  Resident  Pager: [phone] 05/17/2016 1:37 PM

## 2016-05-17 NOTE — Progress Notes (Signed)
ANTICOAGULATION CONSULT NOTE - Follow Up Consult  Pharmacy Consult for Heparin Indication: chest pain/ACS  Allergies  Allergen Reactions  . Lisinopril   . Lunesta [Eszopiclone]   . Simvastatin   . Statins     (Causes weakness in legs and decreases ability to walk.  (lipitor crestor)  . Ultram [Tramadol]     Patient Measurements: Height: 5\' 1"  (154.9 cm) Weight: 133 lb 9.6 oz (60.6 kg) IBW/kg (Calculated) : 47.8 Heparin Dosing Weight: 61 kg  Vital Signs: Temp: 97.8 F (36.6 C) (01/23 1940) Temp Source: Oral (01/23 1940) BP: 135/60 (01/23 2000) Pulse Rate: 69 (01/23 2000)  Labs:  Recent Labs  05/16/16 0007 05/16/16 0352 05/16/16 1018  05/17/16 0437 05/17/16 1219 05/17/16 2122  HGB 12.1 12.1  --   --  11.4*  --   --   HCT 34.2* 33.8*  --   --  33.1*  --   --   PLT 218 245  --   --  244  --   --   HEPARINUNFRC  --   --   --   < > 0.60 0.75* 0.68  CREATININE 0.99 1.01* 0.96  < > 0.95 0.86 1.09*  TROPONINI 0.09* 0.16* 0.41*  --  0.30*  --   --   < > = values in this interval not displayed.  Estimated Creatinine Clearance: 32.1 mL/min (by C-G formula based on SCr of 1.09 mg/dL (H)).   Assessment: [name] who presented on 1/21 with SOB and was noted to have elevated troponins. Pharmacy has been consulted for heparin for anticoagulation while r/o ACS. Heparin to be continued until tomorrow 1/24.   The patient's heparin level is therapeutic 0.68 on 800 units/hr  Hgb/Hct slight drop this AM, plts wnl.    Goal of Therapy:  Heparin level 0.3-0.7 units/ml Monitor platelets by anticoagulation protocol: Yes   Plan:  1. Continue  heparin  800 units/hr (8 ml/hr) 2. Daily heparin level/CBC 3. Will continue to monitor for any signs/symptoms of bleeding   Bonnita Nasuti Pharm.D. CPP, BCPS Clinical Pharmacist [phone] 05/17/2016 10:31 PM

## 2016-05-17 NOTE — Plan of Care (Signed)
Problem: Safety: Goal: Ability to remain free from injury will improve Outcome: Progressing Pt to call nursing staff for assistance, call light within reach, bed alarm is on, bed in lowest position.

## 2016-05-17 NOTE — Progress Notes (Addendum)
Progress Note  Patient Name: [name] Date of Encounter: 05/17/2016  Primary Cardiologist: Dr. Percival Spanish   Subjective   Left shoulder pain (rotator cuff after fall). No SOB. Na++ improved. Discussed fluid restriction.    Inpatient Medications    Scheduled Meds: . amLODipine  10 mg Oral Daily  . artificial tears   Both Eyes Q8H  . aspirin EC  81 mg Oral Daily  . budesonide (PULMICORT) nebulizer solution  0.25 mg Nebulization BID  . calcium-vitamin D  1 tablet Oral BID WC  . citalopram  20 mg Oral Daily  . docusate sodium  100 mg Oral Daily  . doxycycline (VIBRAMYCIN) IV  100 mg Intravenous Q12H  . fluticasone  2 spray Each Nare Daily  . guaiFENesin  600 mg Oral BID  . isosorbide mononitrate  60 mg Oral Daily  . levalbuterol  1.25 mg Nebulization QID  . loratadine  10 mg Oral Daily  . methylPREDNISolone (SOLU-MEDROL) injection  40 mg Intravenous Q12H  . metoprolol succinate  50 mg Oral Daily  . multivitamin with minerals  1 tablet Oral BID  . omega-3 acid ethyl esters  2 capsule Oral BID WC  . oseltamivir  30 mg Oral BID  . pantoprazole  40 mg Oral Daily  . potassium chloride SA  20 mEq Oral Daily  . vitamin C  1,000 mg Oral BID   Continuous Infusions: . heparin 850 Units/hr (05/16/16 1600)   PRN Meds: acetaminophen **OR** acetaminophen, hydroxypropyl methylcellulose / hypromellose, levalbuterol, morphine injection, ondansetron **OR** ondansetron (ZOFRAN) IV   Vital Signs    Vitals:   05/17/16 0500 05/17/16 0600 05/17/16 0700 05/17/16 0830  BP: (!) 148/64 (!) 163/71 (!) 138/51 (!) 139/55  Pulse: 81 88 78 85  Resp: 15 (!) 21 18 17   Temp:    97.5 F (36.4 C)  TempSrc:    Oral  SpO2: 96% 97% 95% (!) 88%  Weight: 133 lb 9.6 oz (60.6 kg)     Height:        Intake/Output Summary (Last 24 hours) at 05/17/16 0925 Last data filed at 05/17/16 0831  Gross per 24 hour  Intake           1047.5 ml  Output              200 ml  Net            847.5 ml   Filed  Weights   05/15/16 1534 05/16/16 0644 05/17/16 0500  Weight: 139 lb (63 kg) 141 lb 8.6 oz (64.2 kg) 133 lb 9.6 oz (60.6 kg)    Telemetry    No adverse rhythms - Personally Reviewed  ECG    Sinus tachycardia 134 bpm Probable LVH with secondary repol abnrm ST depression, probably rate related  - Personally Reviewed  Physical Exam   GEN: No acute distress. Elderly Neck: No JVD Cardiac: RRR, no murmurs, rubs, or gallops.  Respiratory: Mild basilar crackles bilaterally, mild wheeze. GI: Soft, nontender, non-distended  MS: No edema; No deformity. Neuro:  AAOx3. Psych: Normal affect  Labs    Chemistry Recent Labs Lab 05/16/16 1018 05/16/16 1421 05/17/16 0437  NA 122* 124* 128*  K 4.0 4.3 4.6  CL 86* 87* 91*  CO2 24 25 25   GLUCOSE 143* 134* 132*  BUN 28* 31* 35*  CREATININE 0.96 1.00 0.95  CALCIUM 9.0 9.4 9.3  GFRNONAA 53* 50* 53*  GFRAA >60 58* >60  ANIONGAP 12 12 12  Hematology Recent Labs Lab 05/16/16 0007 05/16/16 0352 05/17/16 0437  WBC 4.7 5.2 9.7  RBC 4.00 3.96 3.81*  HGB 12.1 12.1 11.4*  HCT 34.2* 33.8* 33.1*  MCV 85.5 85.4 86.9  MCH 30.3 30.6 29.9  MCHC 35.4 35.8 34.4  RDW 13.1 13.0 13.2  PLT 218 245 244    Cardiac Enzymes Recent Labs Lab 05/16/16 0007 05/16/16 0352 05/16/16 1018 05/17/16 0437  TROPONINI 0.09* 0.16* 0.41* 0.30*   No results for input(s): TROPIPOC in the last 168 hours.   BNP Recent Labs Lab 05/15/16 1603  BNP 129.2*     DDimer No results for input(s): DDIMER in the last 168 hours.   Radiology    Dg Chest 2 View  Result Date: 05/15/2016 CLINICAL DATA:  Left arm pain starting 3 weeks ago EXAM: CHEST  2 VIEW COMPARISON:  05/12/2016 FINDINGS: Cardiomediastinal silhouette is stable. Coarse interstitial prominence bilaterally again noted. Status post median sternotomy. Stable bilateral basilar scarring. No infiltrate or pulmonary edema. Again noted significant degenerative changes left shoulder. Again there is  sclerosis of left glenoid and humeral head with volume loss and deformity of the humeral head. 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 There is anterior inferior subluxation of humeral head from glenohumeral joint. IMPRESSION: Coarse interstitial prominence bilaterally again noted. Status post median sternotomy. Stable bilateral basilar scarring. No infiltrate or pulmonary edema. Again noted significant degenerative changes left shoulder. Again there is sclerosis of left glenoid and humeral head with volume loss and deformity of the humeral head. 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 There is anterior inferior subluxation of humeral head from glenohumeral joint. Electronically Signed   By: Lahoma Crocker M.D.   On: 05/15/2016 17:42    Cardiac Studies   ECHO 2010  - Overall left ventricular systolic function was normal. Left    ventricular ejection fraction was estimated to be 55 %. There    were no left ventricular regional wall motion abnormalities.    Doppler parameters were consistent with high left ventricular    filling pressure. - Aortic valve thickness was mildly increased. There was mild to    moderate aortic valvular regurgitation. - There was mild mitral valvular regurgitation. The effective    orifice of mitral regurgitation by proximal isovelocity    surface area was 0.05 cm^2. The volume of mitral    regurgitation by proximal isovelocity surface area was 10 cc. - The left atrium was mildly dilated. - There was mild to moderate tricuspid valvular regurgitation.   Patient Profile     83 y/o female with h/o CAD s/p remote CABG, bilateral carotid stenosis, HTN and HLD admitted for acute respiratory failure in the setting of presumed bronchitis, as well as for management of hyponatremia w/ Na of 119. Also found to have an abnormal EKG and elevated troponin's x 3.   Assessment & Plan    NSTEMI (likely type 2, demand  ischemia)  - mild rise and fall of troponin (peak 0.4)  - continue heparin IV today with plan to DC tomorrow.   - Plavix 75 added (Cure trial)  - Beta blocker  - Non invasive approach. Family discussion.  - If she ends up having anginal symptoms unrelated to her rotator cuff, we could consider further testing as outpatient.  CAD post CABG  - Last LHC in 2010 showed severe three-vessel coronary artery disease with 2/3 grafts occluded (SVG-PDA and SVG- Diag occluded). Her LIMA-LAD was widely patent. EF was normal.  HTN  -  controlled  on current regimen. Continue metoprolol, amlodipine and Imdur. Maxide discontinued due to hyponatremia.    HLD  -  She has prior h/o statin intolerance. Refuses to restart.   Bronchitis  - per primary team.   Signed, Candee Furbish, MD  05/17/2016, 9:25 AM

## 2016-05-17 NOTE — Progress Notes (Addendum)
PROGRESS NOTE  [name]  [id number] DOB: 04/08/1931 DOA: 05/15/2016 PCP: Anthoney Harada, MD Outpatient Specialists:  Subjective: Denies any new complaints, has some shortness of breath, wheezing and cough. Denies any fever or chills.  Brief Narrative:  [name] is a 83 y.o. female with CAD status post CABG, hypertension, hyperlipidemia who recently moved back to [facility] presents to the ER because of shortness of breath and wheezing over the last 1 week. Patient had gone to the urgent care center recently for bronchitis. In the patient is found to be wheezing and short of breath. Chest x-ray shows chronic findings. Patient denies any chest pain. EKG was showing sinus tachycardia with diffuse ST depression. Labs also revealed severe hyponatremia of 119. Patient states over the last 1 week since she was getting fatigue and weak was drinking more than usual water. Patient in addition also is on Maxzide. Patient is being admitted for acute respiratory failure most likely from bronchitis and severe hyponatremia.   Assessment & Plan:   Principal Problem:   Acute respiratory failure with hypoxemia (HCC) Active Problems:   Essential hypertension, benign   CORONARY ARTERY BYPASS GRAFT, THREE VESSEL, HX OF   Asthma, chronic   Hyponatremia   Acute respiratory failure with hypoxia (HCC)   Acute respiratory failure with hypoxia secondary to influenza bronchitis  She is a hypoxic on admission requiring 2 L of oxygen to keep O2 sats above 90%. Influenza A bronchitis likely causing the hypoxia. Started on Xopenex and steroids, on Tamiflu as well. Continue current respiratory regimen, Tamiflu and doxycycline.  Severe hyponatremia Sodium is 119 on admission. Patient states she has been drinking more than usual water. Patient is also on Maxzide which will be discontinued.  Normal TSH, plasma cortisol is 4.1 but it was done at late night. Continue 800 mL/day fluid  restriction and to hold Maxzide  Non-STEMI Elevated troponin with peak of 0.41, ST segment depression. Cardiology consulted, patient was on aspirin, beta blockers and heparin, Plavix added. Continue IV heparin for one more day, keep and stepdown, check FLP.  CAD status post CABG  EKG shows ST depression. Will cycle cardiac markers and if positive will start heparin.  Patient is on Toprol and aspirin. Intolerant to statins.  Hypertension - discontinuing Maxzide due to hyponatremia. Patient is on Imdur and Toprol and amlodipine.  Left shoulder pain with possible rotator cuff tear - will need orthopedic referral. We'll check MRI if she is able to tolerate it, outpatient orthopedic referral. If she needs surgery in the future, she will probably need cardiac catheterization for cardiac clearance   DVT prophylaxis: Heparin drip Code Status: Full Code Family Communication: Discussed with daughter at bedside Disposition Plan: Continued stepdown Diet: Diet Heart Room service appropriate? Yes; Fluid consistency: Thin; Fluid restriction: Other (see comments)  Consultants:   Cardiology  Procedures:   None  Antimicrobials:   Antiviral: Tamiflu  Objective: Vitals:   05/17/16 0600 05/17/16 0700 05/17/16 0830 05/17/16 0932  BP: (!) 163/71 (!) 138/51 (!) 139/55 (!) 109/40  Pulse: 88 78 85   Resp: (!) 21 18 17    Temp:   97.5 F (36.4 C)   TempSrc:   Oral   SpO2: 97% 95% (!) 88%   Weight:      Height:        Intake/Output Summary (Last 24 hours) at 05/17/16 1100 Last data filed at 05/17/16 0831  Gross per 24 hour  Intake  927.5 ml  Output              200 ml  Net            727.5 ml   Filed Weights   05/15/16 1534 05/16/16 0644 05/17/16 0500  Weight: 63 kg (139 lb) 64.2 kg (141 lb 8.6 oz) 60.6 kg (133 lb 9.6 oz)    Examination: General exam: Appears calm and comfortable  Respiratory system: Clear to auscultation. Respiratory effort normal. Cardiovascular  system: S1 & S2 heard, RRR. No JVD, murmurs, rubs, gallops or clicks. No pedal edema. Gastrointestinal system: Abdomen is nondistended, soft and nontender. No organomegaly or masses felt. Normal bowel sounds heard. Central nervous system: Alert and oriented. No focal neurological deficits. Extremities: Symmetric 5 x 5 power. Skin: No rashes, lesions or ulcers Psychiatry: Judgement and insight appear normal. Mood & affect appropriate.   Data Reviewed: I have personally reviewed following labs and imaging studies  CBC:  Recent Labs Lab 05/15/16 1603 05/16/16 0007 05/16/16 0352 05/17/16 0437  WBC 6.5 4.7 5.2 9.7  NEUTROABS 5.6  --   --   --   HGB 12.8 12.1 12.1 11.4*  HCT 35.5* 34.2* 33.8* 33.1*  MCV 85.5 85.5 85.4 86.9  PLT 201 218 245 [id number]   Basic Metabolic Panel:  Recent Labs Lab 05/16/16 0007 05/16/16 0352 05/16/16 1018 05/16/16 1421 05/17/16 0437  NA 119* 119* 122* 124* 128*  K 3.2* 3.4* 4.0 4.3 4.6  CL 81* 81* 86* 87* 91*  CO2 22 20* 24 25 25   GLUCOSE 313* 230* 143* 134* 132*  BUN 20 21* 28* 31* 35*  CREATININE 0.99 1.01* 0.96 1.00 0.95  CALCIUM 8.8* 8.9 9.0 9.4 9.3   GFR: Estimated Creatinine Clearance: 36.8 mL/min (by C-G formula based on SCr of 0.95 mg/dL). Liver Function Tests: No results for input(s): AST, ALT, ALKPHOS, BILITOT, PROT, ALBUMIN in the last 168 hours. No results for input(s): LIPASE, AMYLASE in the last 168 hours. No results for input(s): AMMONIA in the last 168 hours. Coagulation Profile: No results for input(s): INR, PROTIME in the last 168 hours. Cardiac Enzymes:  Recent Labs Lab 05/15/16 2036 05/16/16 0007 05/16/16 0352 05/16/16 1018 05/17/16 0437  TROPONINI 0.07* 0.09* 0.16* 0.41* 0.30*   BNP (last 3 results) No results for input(s): PROBNP in the last 8760 hours. HbA1C: No results for input(s): HGBA1C in the last 72 hours. CBG: No results for input(s): GLUCAP in the last 168 hours. Lipid Profile: No results for input(s):  CHOL, HDL, LDLCALC, TRIG, CHOLHDL, LDLDIRECT in the last 72 hours. Thyroid Function Tests:  Recent Labs  05/16/16 0007  TSH 0.950   Anemia Panel: No results for input(s): VITAMINB12, FOLATE, FERRITIN, TIBC, IRON, RETICCTPCT in the last 72 hours. Urine analysis:    Component Value Date/Time   APPEARANCEUR Clear 03/07/2016 1428   GLUCOSEU Negative 03/07/2016 1428   BILIRUBINUR Negative 03/07/2016 1428   PROTEINUR Negative 03/07/2016 1428   UROBILINOGEN negative 09/25/2013 1632   NITRITE Negative 03/07/2016 1428   LEUKOCYTESUR 3+ (A) 03/07/2016 1428   Sepsis Labs: @LABRCNTIP (procalcitonin:4,lacticidven:4)  ) Recent Results (from the past 240 hour(s))  MRSA PCR Screening     Status: None   Collection Time: 05/16/16  6:52 AM  Result Value Ref Range Status   MRSA by PCR NEGATIVE NEGATIVE Final    Comment:        The GeneXpert MRSA Assay (FDA approved for NASAL specimens only), is one component of a comprehensive MRSA colonization surveillance  program. It is not intended to diagnose MRSA infection nor to guide or monitor treatment for MRSA infections.      Invalid input(s): PROCALCITONIN, LACTICACIDVEN   Radiology Studies: Dg Chest 2 View  Result Date: 05/15/2016 CLINICAL DATA:  Left arm pain starting 3 weeks ago EXAM: CHEST  2 VIEW COMPARISON:  05/12/2016 FINDINGS: Cardiomediastinal silhouette is stable. Coarse interstitial prominence bilaterally again noted. Status post median sternotomy. Stable bilateral basilar scarring. No infiltrate or pulmonary edema. Again noted significant degenerative changes left shoulder. Again there is sclerosis of left glenoid and humeral head with volume loss and deformity of the humeral head. 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 There is anterior inferior subluxation of humeral head from glenohumeral joint. IMPRESSION: Coarse interstitial prominence bilaterally again noted. Status post median sternotomy. Stable bilateral  basilar scarring. No infiltrate or pulmonary edema. Again noted significant degenerative changes left shoulder. Again there is sclerosis of left glenoid and humeral head with volume loss and deformity of the humeral head. 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 There is anterior inferior subluxation of humeral head from glenohumeral joint. Electronically Signed   By: Lahoma Crocker M.D.   On: 05/15/2016 17:42        Scheduled Meds: . amLODipine  10 mg Oral Daily  . artificial tears   Both Eyes Q8H  . aspirin EC  81 mg Oral Daily  . budesonide (PULMICORT) nebulizer solution  0.25 mg Nebulization BID  . calcium-vitamin D  1 tablet Oral BID WC  . citalopram  20 mg Oral Daily  . docusate sodium  100 mg Oral Daily  . doxycycline (VIBRAMYCIN) IV  100 mg Intravenous Q12H  . fluticasone  2 spray Each Nare Daily  . guaiFENesin  600 mg Oral BID  . isosorbide mononitrate  60 mg Oral Daily  . levalbuterol  1.25 mg Nebulization QID  . loratadine  10 mg Oral Daily  . methylPREDNISolone (SOLU-MEDROL) injection  40 mg Intravenous Q12H  . metoprolol succinate  50 mg Oral Daily  . multivitamin with minerals  1 tablet Oral BID  . omega-3 acid ethyl esters  2 capsule Oral BID WC  . oseltamivir  30 mg Oral BID  . pantoprazole  40 mg Oral Daily  . potassium chloride SA  20 mEq Oral Daily  . vitamin C  1,000 mg Oral BID   Continuous Infusions: . heparin 850 Units/hr (05/16/16 1600)     LOS: 2 days    Time spent: 35 minutes    Verlena Marlette A, MD [facility] Pager [phone]  If 7PM-7AM, please contact night-coverage [email or web address] Password [email or web address] 05/17/2016, 11:00 AM

## 2016-05-17 NOTE — Progress Notes (Addendum)
ANTICOAGULATION CONSULT NOTE - Follow Up Consult  Pharmacy Consult for Heparin Indication: chest pain/ACS  Allergies  Allergen Reactions  . Lisinopril   . Lunesta [Eszopiclone]   . Simvastatin   . Statins     (Causes weakness in legs and decreases ability to walk.  (lipitor crestor)  . Ultram [Tramadol]     Patient Measurements: Height: 5\' 1"  (154.9 cm) Weight: 133 lb 9.6 oz (60.6 kg) IBW/kg (Calculated) : 47.8 Heparin Dosing Weight: 61 kg  Vital Signs: Temp: 97.5 F (36.4 C) (01/23 0830) Temp Source: Oral (01/23 0830) BP: 139/55 (01/23 0830) Pulse Rate: 85 (01/23 0830)  Labs:  Recent Labs  05/16/16 0007 05/16/16 0352 05/16/16 1018 05/16/16 1421 05/16/16 1428 05/17/16 0437  HGB 12.1 12.1  --   --   --  11.4*  HCT 34.2* 33.8*  --   --   --  33.1*  PLT 218 245  --   --   --  244  HEPARINUNFRC  --   --   --   --  0.28* 0.60  CREATININE 0.99 1.01* 0.96 1.00  --  0.95  TROPONINI 0.09* 0.16* 0.41*  --   --  0.30*    Estimated Creatinine Clearance: 36.8 mL/min (by C-G formula based on SCr of 0.95 mg/dL).   Medications:  Heparin @ 850 units/hr (8.5 ml/hr)  Assessment: 57 YOF who presented on 1/21 with SOB and was noted to have elevated troponins. Pharmacy has been consulted for heparin for anticoagulation while r/o ACS.  The patient's heparin level this morning is therapeutic after a rate adjustment yesterday (HL 0.6 << 0.28, goal of 0.3-0.7). Hgb/Hct slight drop this AM, plts wnl. No bleeding noted.   Goal of Therapy:  Heparin level 0.3-0.7 units/ml Monitor platelets by anticoagulation protocol: Yes   Plan:  1. Continue Heparin at 850 units/hr (8.5 ml/hr) 2. Will continue to monitor for any signs/symptoms of bleeding and will follow up with heparin level in 8 hours to confirm  Thank you for allowing pharmacy to be a part of this patient's care.  Alycia Rossetti, PharmD, BCPS Clinical Pharmacist Pager: [phone] 05/17/2016 9:08 AM

## 2016-05-17 NOTE — Progress Notes (Signed)
Trach changed to a 6 cuffless

## 2016-05-18 ENCOUNTER — Inpatient Hospital Stay (HOSPITAL_COMMUNITY): Payer: Medicare Other

## 2016-05-18 DIAGNOSIS — J441 Chronic obstructive pulmonary disease with (acute) exacerbation: Secondary | ICD-10-CM

## 2016-05-18 DIAGNOSIS — J4520 Mild intermittent asthma, uncomplicated: Secondary | ICD-10-CM

## 2016-05-18 DIAGNOSIS — I10 Essential (primary) hypertension: Secondary | ICD-10-CM

## 2016-05-18 LAB — BASIC METABOLIC PANEL
ANION GAP: 10 (ref 5–15)
ANION GAP: 11 (ref 5–15)
ANION GAP: 9 (ref 5–15)
BUN: 32 mg/dL — AB (ref 6–20)
BUN: 32 mg/dL — ABNORMAL HIGH (ref 6–20)
BUN: 31 mg/dL — ABNORMAL HIGH (ref 6–20)
CALCIUM: 9.2 mg/dL (ref 8.9–10.3)
CALCIUM: 9 mg/dL (ref 8.9–10.3)
CO2: 26 mmol/L (ref 22–32)
CO2: 25 mmol/L (ref 22–32)
CO2: 26 mmol/L (ref 22–32)
Calcium: 9.5 mg/dL (ref 8.9–10.3)
Chloride: 92 mmol/L — ABNORMAL LOW (ref 101–111)
Chloride: 90 mmol/L — ABNORMAL LOW (ref 101–111)
Chloride: 92 mmol/L — ABNORMAL LOW (ref 101–111)
Creatinine, Ser: 0.86 mg/dL (ref 0.44–1.00)
Creatinine, Ser: 0.79 mg/dL (ref 0.44–1.00)
Creatinine, Ser: 1.15 mg/dL — ABNORMAL HIGH (ref 0.44–1.00)
GFR calc Af Amer: 49 mL/min — ABNORMAL LOW (ref >60)
GFR, EST NON AFRICAN AMERICAN: 42 mL/min — AB (ref >60)
GLUCOSE: 85 mg/dL (ref 65–99)
GLUCOSE: 113 mg/dL — AB (ref 65–99)
Glucose, Bld: 117 mg/dL — ABNORMAL HIGH (ref 65–99)
POTASSIUM: 4.8 mmol/L (ref 3.5–5.1)
Potassium: 4.4 mmol/L (ref 3.5–5.1)
Potassium: 5.2 mmol/L — ABNORMAL HIGH (ref 3.5–5.1)
SODIUM: 126 mmol/L — AB (ref 135–145)
Sodium: 128 mmol/L — ABNORMAL LOW (ref 135–145)
Sodium: 127 mmol/L — ABNORMAL LOW (ref 135–145)

## 2016-05-18 LAB — CBC
HCT: 33.6 % — ABNORMAL LOW (ref 36.0–46.0)
Hemoglobin: 11.5 g/dL — ABNORMAL LOW (ref 12.0–15.0)
MCH: 30 pg (ref 26.0–34.0)
MCHC: 34.2 g/dL (ref 30.0–36.0)
MCV: 87.7 fL (ref 78.0–100.0)
PLATELETS: 283 10*3/uL (ref 150–400)
RBC: 3.83 MIL/uL — AB (ref 3.87–5.11)
RDW: 13.6 % (ref 11.5–15.5)
WBC: 8 10*3/uL (ref 4.0–10.5)

## 2016-05-18 LAB — URINALYSIS, ROUTINE W REFLEX MICROSCOPIC
BILIRUBIN URINE: NEGATIVE
Glucose, UA: NEGATIVE mg/dL
HGB URINE DIPSTICK: NEGATIVE
Ketones, ur: NEGATIVE mg/dL
LEUKOCYTES UA: NEGATIVE
NITRITE: POSITIVE — AB
PH: 7 (ref 5.0–8.0)
Protein, ur: NEGATIVE mg/dL
Specific Gravity, Urine: 1.011 (ref 1.005–1.030)

## 2016-05-18 LAB — OSMOLALITY, URINE: OSMOLALITY UR: 417 mosm/kg (ref 300–900)

## 2016-05-18 LAB — SODIUM, URINE, RANDOM: Sodium, Ur: 59 mmol/L

## 2016-05-18 LAB — OSMOLALITY: Osmolality: 277 mOsm/kg (ref 275–295)

## 2016-05-18 LAB — HEPARIN LEVEL (UNFRACTIONATED): Heparin Unfractionated: 0.77 IU/mL — ABNORMAL HIGH (ref 0.30–0.70)

## 2016-05-18 MED ORDER — ARFORMOTEROL TARTRATE 15 MCG/2ML IN NEBU
15.0000 ug | INHALATION_SOLUTION | Freq: Two times a day (BID) | RESPIRATORY_TRACT | Status: DC
  Administered 2016-05-18 – 2016-05-19 (×3): 15 ug via RESPIRATORY_TRACT
  Filled 2016-05-18 (×3): qty 2

## 2016-05-18 MED ORDER — GADOBENATE DIMEGLUMINE 529 MG/ML IV SOLN
10.0000 mL | Freq: Once | INTRAVENOUS | Status: AC | PRN
  Administered 2016-05-18: 10 mL via INTRAVENOUS

## 2016-05-18 MED ORDER — HEPARIN SODIUM (PORCINE) 5000 UNIT/ML IJ SOLN
5000.0000 [IU] | Freq: Three times a day (TID) | INTRAMUSCULAR | Status: DC
  Administered 2016-05-18 – 2016-05-19 (×3): 5000 [IU] via SUBCUTANEOUS
  Filled 2016-05-18 (×3): qty 1

## 2016-05-18 NOTE — Progress Notes (Signed)
Progress Note  Patient Name: [name] Date of Encounter: 05/18/2016  Primary Cardiologist: Dr. Percival Spanish   Subjective   Left shoulder pain (rotator cuff after fall). mild SOB. Na++ improved. Discussed fluid restriction again.  Getting breathing tx.   Inpatient Medications    Scheduled Meds: . amLODipine  10 mg Oral Daily  . artificial tears   Both Eyes Q8H  . aspirin EC  81 mg Oral Daily  . budesonide (PULMICORT) nebulizer solution  0.25 mg Nebulization BID  . calcium-vitamin D  1 tablet Oral BID WC  . citalopram  20 mg Oral Daily  . docusate sodium  100 mg Oral Daily  . doxycycline (VIBRAMYCIN) IV  100 mg Intravenous Q12H  . fluticasone  2 spray Each Nare Daily  . guaiFENesin  600 mg Oral BID  . isosorbide mononitrate  60 mg Oral Daily  . levalbuterol  1.25 mg Nebulization BID  . loratadine  10 mg Oral Daily  . methylPREDNISolone (SOLU-MEDROL) injection  40 mg Intravenous Q12H  . metoprolol succinate  50 mg Oral Daily  . multivitamin with minerals  1 tablet Oral BID  . omega-3 acid ethyl esters  2 capsule Oral BID WC  . oseltamivir  30 mg Oral BID  . pantoprazole  40 mg Oral Daily  . polyethylene glycol  17 g Oral Daily  . potassium chloride SA  20 mEq Oral Daily  . vitamin C  1,000 mg Oral BID   Continuous Infusions: . heparin 800 Units/hr (05/17/16 1350)   PRN Meds: acetaminophen **OR** acetaminophen, hydroxypropyl methylcellulose / hypromellose, levalbuterol, morphine injection, ondansetron **OR** ondansetron (ZOFRAN) IV   Vital Signs    Vitals:   05/18/16 0356 05/18/16 0400 05/18/16 0428 05/18/16 0853  BP:  (!) 169/63  132/62  Pulse:  78 69 71  Resp:  18 15 17   Temp: 98.5 F (36.9 C)   97.9 F (36.6 C)  TempSrc: Oral   Oral  SpO2:  99% 98% 96%  Weight: 136 lb 7.4 oz (61.9 kg)     Height:        Intake/Output Summary (Last 24 hours) at 05/18/16 0943 Last data filed at 05/18/16 0400  Gross per 24 hour  Intake          1138.67 ml  Output                 0 ml  Net          1138.67 ml   Filed Weights   05/16/16 0644 05/17/16 0500 05/18/16 0356  Weight: 141 lb 8.6 oz (64.2 kg) 133 lb 9.6 oz (60.6 kg) 136 lb 7.4 oz (61.9 kg)    Telemetry    No adverse rhythms - Personally Reviewed  ECG    Sinus tachycardia 134 bpm Probable LVH with secondary repol abnrm ST depression, probably rate related  - Personally Reviewed  Physical Exam   GEN: No acute distress. Elderly Neck: No JVD Cardiac: RRR, no murmurs, rubs, or gallops.  Respiratory: mild wheeze. GI: Soft, nontender, non-distended  MS: No edema; No deformity. Neuro:  AAOx3. Psych: Normal affect  Labs    Chemistry  Recent Labs Lab 05/17/16 1219 05/17/16 2122 05/18/16 0551  NA 127* 127* 128*  K 4.6 5.1 4.8  CL 93* 92* 92*  CO2 23 23 26   GLUCOSE 99 123* 117*  BUN 34* 37* 32*  CREATININE 0.86 1.09* 0.86  CALCIUM 9.3 9.1 9.5  GFRNONAA >60 45* >60  GFRAA >60 52* >  60  ANIONGAP 11 12 10      Hematology  Recent Labs Lab 05/16/16 0352 05/17/16 0437 05/18/16 0551  WBC 5.2 9.7 8.0  RBC 3.96 3.81* 3.83*  HGB 12.1 11.4* 11.5*  HCT 33.8* 33.1* 33.6*  MCV 85.4 86.9 87.7  MCH 30.6 29.9 30.0  MCHC 35.8 34.4 34.2  RDW 13.0 13.2 13.6  PLT 245 244 283    Cardiac Enzymes  Recent Labs Lab 05/16/16 0007 05/16/16 0352 05/16/16 1018 05/17/16 0437  TROPONINI 0.09* 0.16* 0.41* 0.30*   No results for input(s): TROPIPOC in the last 168 hours.   BNP  Recent Labs Lab 05/15/16 1603  BNP 129.2*     DDimer No results for input(s): DDIMER in the last 168 hours.   Radiology    No results found.  Cardiac Studies   ECHO 2010  - Overall left ventricular systolic function was normal. Left    ventricular ejection fraction was estimated to be 55 %. There    were no left ventricular regional wall motion abnormalities.    Doppler parameters were consistent with high left ventricular    filling pressure. - Aortic valve thickness was  mildly increased. There was mild to    moderate aortic valvular regurgitation. - There was mild mitral valvular regurgitation. The effective    orifice of mitral regurgitation by proximal isovelocity    surface area was 0.05 cm^2. The volume of mitral    regurgitation by proximal isovelocity surface area was 10 cc. - The left atrium was mildly dilated. - There was mild to moderate tricuspid valvular regurgitation.   Patient Profile     83 y/o female with h/o CAD s/p remote CABG, bilateral carotid stenosis, HTN and HLD admitted for acute respiratory failure in the setting of presumed bronchitis, as well as for management of hyponatremia w/ Na of 119. Also found to have an abnormal EKG and elevated troponin's x 3.   Assessment & Plan    NSTEMI (likely type 2, demand ischemia)  - mild rise and fall of troponin (peak 0.4)  - DC heparin IV today    - Plavix 75 added (Cure trial)  - Beta blocker  - Non invasive approach. Family discussion.  - If she ends up having anginal symptoms unrelated to her rotator cuff, we could consider further testing as outpatient.  CAD post CABG  - Last LHC in 2010 showed severe three-vessel coronary artery disease with 2/3 grafts occluded (SVG-PDA and SVG- Diag occluded). Her LIMA-LAD was widely patent. EF was normal.  HTN  -  controlled on current regimen. Continue metoprolol, amlodipine and Imdur. Maxide discontinued due to hyponatremia.    HLD  -  She has prior h/o statin intolerance. Refuses to restart.   Bronchitis  - per primary team.   Will sign off. Please call if ?  Signed, Candee Furbish, MD  05/18/2016, 9:43 AM

## 2016-05-18 NOTE — Progress Notes (Signed)
Attempted to see patient.  She is currently off floor getting shoulder MRI.  Will come by again tomorrow and also review MRI.   Nicholes Stairs, MD [facility].

## 2016-05-18 NOTE — Progress Notes (Signed)
ANTICOAGULATION CONSULT NOTE - Follow Up Consult  Pharmacy Consult for Heparin Indication: chest pain/ACS  Allergies  Allergen Reactions  . Lisinopril   . Lunesta [Eszopiclone]   . Simvastatin   . Statins     (Causes weakness in legs and decreases ability to walk.  (lipitor crestor)  . Ultram [Tramadol]     Patient Measurements: Height: 5\' 1"  (154.9 cm) Weight: 136 lb 7.4 oz (61.9 kg) IBW/kg (Calculated) : 47.8 Heparin Dosing Weight: 61 kg  Vital Signs: Temp: 98.5 F (36.9 C) (01/24 0356) Temp Source: Oral (01/24 0356) BP: 169/63 (01/24 0400) Pulse Rate: 69 (01/24 0428)  Labs:  Recent Labs  05/16/16 0352 05/16/16 1018  05/17/16 0437 05/17/16 1219 05/17/16 2122 05/18/16 0551  HGB 12.1  --   --  11.4*  --   --  11.5*  HCT 33.8*  --   --  33.1*  --   --  33.6*  PLT 245  --   --  244  --   --  283  HEPARINUNFRC  --   --   < > 0.60 0.75* 0.68 0.77*  CREATININE 1.01* 0.96  < > 0.95 0.86 1.09* 0.86  TROPONINI 0.16* 0.41*  --  0.30*  --   --   --   < > = values in this interval not displayed.  Estimated Creatinine Clearance: 41.1 mL/min (by C-G formula based on SCr of 0.86 mg/dL).   Assessment: 60 YOF who presented on 1/21 with SOB and was noted to have elevated troponins. Pharmacy has been consulted for heparin for anticoagulation while r/o ACS. Heparin to be continued until tomorrow 1/24.   The patient's heparin level of 0.77 is slightly above desired range of 0.3-0.7. CBC remains stable. Per cardiology will likely discontinue heparin today.   Goal of Therapy:  Heparin level 0.3-0.7 units/ml Monitor platelets by anticoagulation protocol: Yes   Plan:  1. Reduce heparin infusion to 700 units/hr (7 ml/hr) 2. Daily heparin level/CBC 3. Will continue to monitor for any signs/symptoms of bleeding   Vincenza Hews, PharmD, BCPS 05/18/2016, 8:49 AM

## 2016-05-18 NOTE — Progress Notes (Signed)
   Per patient request, Advanced Directive documentation left at bedside.  If/when patient ready to complete documentation, please page on-call chaplain or contact the spiritual care department.  - Rev. Randlett MDiv ThM

## 2016-05-18 NOTE — Progress Notes (Addendum)
[facility]                                                                              Patient Demographics  [name], is a 83 y.o. female, DOB - 1930/08/09, [id number]  Admit date - 05/15/2016   Admitting Physician Shelly Patience, MD  Outpatient Primary MD for the patient is Shelly Harada, MD  Outpatient specialists:   LOS - 3  days    Chief Complaint  Patient presents with  . Shortness of Breath       Brief summary   Shelly V Bradleyis a 83 y.o.femalewith CAD status post CABG, hypertension, hyperlipidemia who recently moved back to [place] presents to the ER because of shortness of breath and wheezing over the last 1 week. Patient had gone to the urgent care center recently for bronchitis. In the patient was found to be wheezing and short of breath. Chest x-ray showed chronic findings.  EKG showed sinus tachycardia with diffuse ST depression. Labs revealed severe hyponatremia of 119. Patient reported over the last 1 week since she was getting fatigue and weak was drinking more than usual water. Patient in addition also is on Maxzide.Patient was admitted for acute respiratory failure most likely from bronchitis and severe hyponatremia.    Assessment & Plan    Principal Problem:  Acute respiratory failure with hypoxiasecondary to influenza And acute bronchitis - Patient was hypoxic on admission requiring  requiring 2 L of oxygen to keep O2 sats above 90%. - Influenza panel was positive for influenza A - Chest x-ray on admission showed increased coarsening of the bilateral lung interstitium, difficult to exclude a subtle superimposed infectious process - Continue IV Solu-Medrol, Xopenex, doxycycline, Tamiflu - Continue Mucinex, Pulmicort, add Brovana, flutter valve  Active problems Severe hyponatremia - Sodium is 119 on admission. Improving, likely has underlying SIADH  - Patient states she has been drinking more than usual  water. Patient is also on Maxzide which will be discontinued.  - Normal TSH, plasma cortisol is 4.1 but it was done at late night. - Continue 800 mL/day fluid restriction and to hold Maxzide, celexa - will check serum osmolarity, urine osm, UNA  Non-STEMI - Elevated troponin with peak of 0.41, ST segment depression. - Cardiology consulted, patient was on aspirin, beta blockers and heparin, Plavix added. - Cardiology recommended to discontinue heparin today, added Plavix, beta blocker, discussed with family, non-invasive approach.  CAD status post CABG  EKG shows ST depression. Will cycle cardiac markers and if positive will start heparin.  Patient is on Toprol and aspirin. Intolerant to statins.  Hypertension -Discontinued Maxzide due to hyponatremia. - Continue Imdur, Toprol and amlodipine.  Left shoulder pain - Chest x-ray on 1/21 showed synovial chondromatosis anterior inferior subluxation of the humeral head from the glenohumeral joint - Shoulder x-ray consistent with chronic rotator cuff tear - Discussed in detail with patient's daughter at the bedside, she had appointment on Monday, 1/22 with [facility] orthopedics, [name], had to cancel.  - obtain MRI of the left shoulder and consulted [facility] orthopedics.  Generalized debility - PT OT evaluation, patient's daughter interested in skilled nursing facility,  works during the day  Code Status: FULL CODE  DVT Prophylaxis:  heparin  Family Communication: Discussed in detail with the patient, all imaging results, lab results explained to the patient and daughter   Disposition Plan: Patient's daughter interested in skilled nursing facility for rehabilitation   Time Spent in minutes   25 minutes  Procedures:    Consultants:   Cardiology   orthopedics  Antimicrobials:   Doxycycline  Tamiflu    Medications  Scheduled Meds: . amLODipine  10 mg Oral Daily  . artificial tears   Both Eyes Q8H  . aspirin EC   81 mg Oral Daily  . budesonide (PULMICORT) nebulizer solution  0.25 mg Nebulization BID  . calcium-vitamin D  1 tablet Oral BID WC  . citalopram  20 mg Oral Daily  . docusate sodium  100 mg Oral Daily  . doxycycline (VIBRAMYCIN) IV  100 mg Intravenous Q12H  . fluticasone  2 spray Each Nare Daily  . guaiFENesin  600 mg Oral BID  . isosorbide mononitrate  60 mg Oral Daily  . levalbuterol  1.25 mg Nebulization BID  . loratadine  10 mg Oral Daily  . methylPREDNISolone (SOLU-MEDROL) injection  40 mg Intravenous Q12H  . metoprolol succinate  50 mg Oral Daily  . multivitamin with minerals  1 tablet Oral BID  . omega-3 acid ethyl esters  2 capsule Oral BID WC  . oseltamivir  30 mg Oral BID  . pantoprazole  40 mg Oral Daily  . polyethylene glycol  17 g Oral Daily  . potassium chloride SA  20 mEq Oral Daily  . vitamin C  1,000 mg Oral BID   Continuous Infusions: PRN Meds:.acetaminophen **OR** acetaminophen, hydroxypropyl methylcellulose / hypromellose, levalbuterol, morphine injection, ondansetron **OR** ondansetron (ZOFRAN) IV   Antibiotics   Anti-infectives    Start     Dose/Rate Route Frequency Ordered Stop   05/15/16 2315  doxycycline (VIBRAMYCIN) 100 mg in dextrose 5 % 250 mL IVPB     100 mg 125 mL/hr over 120 Minutes Intravenous Every 12 hours 05/15/16 2221     05/15/16 2230  oseltamivir (TAMIFLU) capsule 30 mg     30 mg Oral 2 times daily 05/15/16 2221 05/20/16 2159        Subjective:   [name] was seen and examined today. Complaining of pain in the left shoulder 6/10. No fevers or chills. Shortness of breath is improving.  Patient denies dizziness, chest pain, abdominal pain, N/V/D/C, new weakness, numbess, tingling. No acute events overnight.    Objective:   Vitals:   05/18/16 0400 05/18/16 0428 05/18/16 0853 05/18/16 0943  BP: (!) 169/63  132/62   Pulse: 78 69 71   Resp: 18 15 17    Temp:   97.9 F (36.6 C)   TempSrc:   Oral   SpO2: 99% 98% 96% 97%    Weight:      Height:        Intake/Output Summary (Last 24 hours) at 05/18/16 1132 Last data filed at 05/18/16 0400  Gross per 24 hour  Intake          1138.67 ml  Output                0 ml  Net          1138.67 ml     Wt Readings from Last 3 Encounters:  05/18/16 61.9 kg (136 lb 7.4 oz)  04/22/16 64.2 kg (141 lb 9.6 oz)  03/07/16 65.3  kg (144 lb)     Exam  General: Alert and oriented x 3, NAD  HEENT:   Neck: Supple, no JVD  Cardiovascular: S1 S2 auscultated, no rubs, murmurs or gallops. Regular rate and rhythm.  Respiratory: Diffuse coarse rhonchi bilaterally  Gastrointestinal: Soft, nontender, nondistended, + bowel sounds  Ext: no cyanosis clubbing or edema. Pain with abduction, external rotation of the left shoulder   Neuro: AAOx3, Cr N's II- XII. Strength 5/5 upper and lower extremities bilaterally  Skin: No rashes  Psych: Normal affect and demeanor, alert and oriented x3    Data Reviewed:  I have personally reviewed following labs and imaging studies  Micro Results Recent Results (from the past 240 hour(s))  MRSA PCR Screening     Status: None   Collection Time: 05/16/16  6:52 AM  Result Value Ref Range Status   MRSA by PCR NEGATIVE NEGATIVE Final    Comment:        The GeneXpert MRSA Assay (FDA approved for NASAL specimens only), is one component of a comprehensive MRSA colonization surveillance program. It is not intended to diagnose MRSA infection nor to guide or monitor treatment for MRSA infections.     Radiology Reports Dg Chest 2 View  Result Date: 05/15/2016 CLINICAL DATA:  Left arm pain starting 3 weeks ago EXAM: CHEST  2 VIEW COMPARISON:  05/12/2016 FINDINGS: Cardiomediastinal silhouette is stable. Coarse interstitial prominence bilaterally again noted. Status post median sternotomy. Stable bilateral basilar scarring. No infiltrate or pulmonary edema. Again noted significant degenerative changes left shoulder. Again there is  sclerosis of left glenoid and humeral head with volume loss and deformity of the humeral head. 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 There is anterior inferior subluxation of humeral head from glenohumeral joint. IMPRESSION: Coarse interstitial prominence bilaterally again noted. Status post median sternotomy. Stable bilateral basilar scarring. No infiltrate or pulmonary edema. Again noted significant degenerative changes left shoulder. Again there is sclerosis of left glenoid and humeral head with volume loss and deformity of the humeral head. 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 There is anterior inferior subluxation of humeral head from glenohumeral joint. Electronically Signed   By: Lahoma Crocker M.D.   On: 05/15/2016 17:42   Dg Chest 2 View  Result Date: 05/12/2016 CLINICAL DATA:  Nonproductive cough. EXAM: CHEST  2 VIEW COMPARISON:  August 21, 2012 FINDINGS: Interval increase in coarsened interstitial markings consistent with progression of the suspected interstitial lung disease seen on not high contrast CT of the chest from July 2014. No nodules or masses. No focal infiltrates. The cardiomediastinal silhouette is stable. No other acute abnormalities. 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 IMPRESSION: Increased coarsening of the bilateral lung interstitium suggests worsening of the interstitial lung disease seen in July 2014. It would be difficult to completely exclude a subtle superimposed infectious process but no definitive focal infiltrate is identified. Electronically Signed   By: Dorise Bullion III M.D   On: 05/12/2016 19:08   Dg Shoulder Left  Result Date: 05/12/2016 CLINICAL DATA:  Right shoulder pain. History of fall 8 months ago. Initial encounter. EXAM: LEFT SHOULDER - 2+ VIEW COMPARISON:  None. FINDINGS: No acute bony or joint abnormality is identified. The humeral head is high-riding compatible with chronic rotator cuff tear.  Several small loose bodies are identified subjacent to the coracoid process. There is mild acromioclavicular osteoarthritis. Prominent soft tissue attenuation above the acromion measures 2.6 cm transverse by 1.4 cm craniocaudal and is compatible with fluid. IMPRESSION: No  acute abnormality. High-riding humeral head consistent with chronic rotator cuff tear. Subcutaneous soft tissue attenuation superior and lateral to the acromion is likely fluid which may have extravasated through the patient's rotator cuff tear could be due to adventitial bursitis. Nonemergent MRI could be used for further evaluation. Electronically Signed   By: Inge Shelly M.D.   On: 05/12/2016 19:05   Dg Knee Ap/lat W/sunrise Left  Result Date: 05/12/2016 CLINICAL DATA:  Pain for 1 month. EXAM: LEFT KNEE 3 VIEWS COMPARISON:  None. FINDINGS: No evidence of fracture, dislocation, or joint effusion. No evidence of arthropathy or other focal bone abnormality. Soft tissues are unremarkable. IMPRESSION: Negative. Electronically Signed   By: Dorise Bullion III M.D   On: 05/12/2016 19:05    Lab Data:  CBC:  Recent Labs Lab 05/15/16 1603 05/16/16 0007 05/16/16 0352 05/17/16 0437 05/18/16 0551  WBC 6.5 4.7 5.2 9.7 8.0  NEUTROABS 5.6  --   --   --   --   HGB 12.8 12.1 12.1 11.4* 11.5*  HCT 35.5* 34.2* 33.8* 33.1* 33.6*  MCV 85.5 85.5 85.4 86.9 87.7  PLT 201 218 245 244 [id number]   Basic Metabolic Panel:  Recent Labs Lab 05/16/16 1421 05/17/16 0437 05/17/16 1219 05/17/16 2122 05/18/16 0551  NA 124* 128* 127* 127* 128*  K 4.3 4.6 4.6 5.1 4.8  CL 87* 91* 93* 92* 92*  CO2 25 25 23 23 26   GLUCOSE 134* 132* 99 123* 117*  BUN 31* 35* 34* 37* 32*  CREATININE 1.00 0.95 0.86 1.09* 0.86  CALCIUM 9.4 9.3 9.3 9.1 9.5   GFR: Estimated Creatinine Clearance: 41.1 mL/min (by C-G formula based on SCr of 0.86 mg/dL). Liver Function Tests: No results for input(s): AST, ALT, ALKPHOS, BILITOT, PROT, ALBUMIN in the last 168 hours. No  results for input(s): LIPASE, AMYLASE in the last 168 hours. No results for input(s): AMMONIA in the last 168 hours. Coagulation Profile: No results for input(s): INR, PROTIME in the last 168 hours. Cardiac Enzymes:  Recent Labs Lab 05/15/16 2036 05/16/16 0007 05/16/16 0352 05/16/16 1018 05/17/16 0437  TROPONINI 0.07* 0.09* 0.16* 0.41* 0.30*   BNP (last 3 results) No results for input(s): PROBNP in the last 8760 hours. HbA1C: No results for input(s): HGBA1C in the last 72 hours. CBG: No results for input(s): GLUCAP in the last 168 hours. Lipid Profile: No results for input(s): CHOL, HDL, LDLCALC, TRIG, CHOLHDL, LDLDIRECT in the last 72 hours. Thyroid Function Tests:  Recent Labs  05/16/16 0007  TSH 0.950   Anemia Panel: No results for input(s): VITAMINB12, FOLATE, FERRITIN, TIBC, IRON, RETICCTPCT in the last 72 hours. Urine analysis:    Component Value Date/Time   APPEARANCEUR Clear 03/07/2016 1428   GLUCOSEU Negative 03/07/2016 1428   BILIRUBINUR Negative 03/07/2016 1428   PROTEINUR Negative 03/07/2016 1428   UROBILINOGEN negative 09/25/2013 1632   NITRITE Negative 03/07/2016 1428   LEUKOCYTESUR 3+ (A) 03/07/2016 1428     RAI,RIPUDEEP M.D. [facility] 05/18/2016, 11:32 AM  Pager: [phone] Between 7am to 7pm - call Pager - [phone]  After 7pm go to [email or web address] - password [email or web address]  Call night coverage person covering after 7pm

## 2016-05-19 LAB — BASIC METABOLIC PANEL
ANION GAP: 9 (ref 5–15)
BUN: 34 mg/dL — ABNORMAL HIGH (ref 6–20)
CHLORIDE: 93 mmol/L — AB (ref 101–111)
CO2: 26 mmol/L (ref 22–32)
Calcium: 9.3 mg/dL (ref 8.9–10.3)
Creatinine, Ser: 0.97 mg/dL (ref 0.44–1.00)
GFR calc non Af Amer: 52 mL/min — ABNORMAL LOW (ref >60)
Glucose, Bld: 132 mg/dL — ABNORMAL HIGH (ref 65–99)
POTASSIUM: 5.2 mmol/L — AB (ref 3.5–5.1)
Sodium: 128 mmol/L — ABNORMAL LOW (ref 135–145)

## 2016-05-19 LAB — URINE CULTURE

## 2016-05-19 LAB — CBC
HCT: 31.5 % — ABNORMAL LOW (ref 36.0–46.0)
HEMOGLOBIN: 10.9 g/dL — AB (ref 12.0–15.0)
MCH: 30.3 pg (ref 26.0–34.0)
MCHC: 34.6 g/dL (ref 30.0–36.0)
MCV: 87.5 fL (ref 78.0–100.0)
Platelets: 278 10*3/uL (ref 150–400)
RBC: 3.6 MIL/uL — ABNORMAL LOW (ref 3.87–5.11)
RDW: 13.3 % (ref 11.5–15.5)
WBC: 7.1 10*3/uL (ref 4.0–10.5)

## 2016-05-19 LAB — HEPARIN LEVEL (UNFRACTIONATED): Heparin Unfractionated: <0.1 IU/mL — ABNORMAL LOW (ref 0.30–0.70)

## 2016-05-19 MED ORDER — FUROSEMIDE 10 MG/ML IJ SOLN: 40.0000 mg | Freq: Once | INTRAMUSCULAR | Status: DC

## 2016-05-19 MED ORDER — LORAZEPAM 1 MG PO TABS
1.0000 mg | ORAL_TABLET | Freq: Once | ORAL | Status: AC
  Administered 2016-05-19: 1 mg via ORAL
  Filled 2016-05-19: qty 1

## 2016-05-19 MED ORDER — FUROSEMIDE 10 MG/ML IJ SOLN
20.0000 mg | Freq: Once | INTRAMUSCULAR | Status: AC
  Administered 2016-05-19: 20 mg via INTRAVENOUS
  Filled 2016-05-19: qty 2

## 2016-05-19 MED ORDER — SODIUM POLYSTYRENE SULFONATE 15 GM/60ML PO SUSP
30.0000 g | Freq: Once | ORAL | Status: AC
  Administered 2016-05-19: 30 g via ORAL
  Filled 2016-05-19: qty 120

## 2016-05-19 NOTE — Progress Notes (Signed)
[facility]                                                                              Patient Demographics  [name], is a 83 y.o. female, DOB - 1931-02-12, [id number]  Admit date - 05/15/2016   Admitting Physician Rise Patience, MD  Outpatient Primary MD for the patient is Anthoney Harada, MD  Outpatient specialists:   LOS - 4  days    Chief Complaint  Patient presents with  . Shortness of Breath       Brief summary   Shelly V Bradleyis a 83 y.o.femalewith CAD status post CABG, hypertension, hyperlipidemia who recently moved back to [place] presents to the ER because of shortness of breath and wheezing over the last 1 week. Patient had gone to the urgent care center recently for bronchitis. In the patient was found to be wheezing and short of breath. Chest x-ray showed chronic findings.  EKG showed sinus tachycardia with diffuse ST depression. Labs revealed severe hyponatremia of 119. Patient reported over the last 1 week since she was getting fatigue and weak was drinking more than usual water. Patient in addition also is on Maxzide.Patient was admitted for acute respiratory failure most likely from bronchitis and severe hyponatremia.    Assessment & Plan    Principal Problem:  Acute respiratory failure with hypoxiasecondary to influenza And acute bronchitis - Patient was hypoxic on admission requiring  requiring 2 L of oxygen to keep O2 sats above 90%. - Influenza panel was positive for influenza A - Chest x-ray on admission showed increased coarsening of the bilateral lung interstitium, difficult to exclude a subtle superimposed infectious process - Continue IV Solu-Medrol, Xopenex, doxycycline, Tamiflu - Continue Mucinex, Pulmicort, add Brovana, flutter valve  Active problems Severe hyponatremia Due to SIADH - Sodium is 119 on admission. Improving, likely has underlying SIADH  - Patient states she has been drinking  more than usual water. Patient is also on Maxzide which will be discontinued.  - Normal TSH, plasma cortisol is 4.1 but it was done at late night. - Continue 800 mL/day fluid restriction and to hold Maxzide, celexa - Urine osmolarity 417, urine sodium 59, serum osmolarity 277 - will give 1 dose of IV Lasix, will also bring potassium down to 5.2 today  Non-STEMI - Elevated troponin with peak of 0.41, ST segment depression. - Cardiology consulted, patient was on aspirin, beta blockers and heparin, Plavix added. - Cardiology recommended to discontinue heparin today, added Plavix, beta blocker, discussed with family, non-invasive approach.  Left shoulder pain - Chest x-ray on 1/21 showed synovial chondromatosis anterior inferior subluxation of the humeral head from the glenohumeral joint. Shoulder x-ray consistent with chronic rotator cuff tear - MRI of the left shoulder showed large joint effusion concerning for hemarthrosis, extensive erosive changes of the distal clavicle, humeral head and anterior half of the glenoid can be seen in inflammatory or crystalline arthropathy, septic arthritis considered less likely. Complete chronic rotator cuff tear. - Orthopedics consulted, and Dr. Stann Mainland will follow today  CAD status post CABG  EKG shows ST depression. Will cycle cardiac markers and if positive will  start heparin.  Patient is on Toprol and aspirin. Intolerant to statins.  Hypertension - Discontinued Maxzide due to hyponatremia. - Continue Imdur, Toprol and amlodipine.  Generalized debility - PT OT evaluation, patient's daughter interested in skilled nursing facility, works during the day  Code Status: FULL CODE  DVT Prophylaxis:  heparin  Family Communication: Discussed in detail with the patient, all imaging results, lab results explained to the patient and daughter   Disposition Plan: Patient's daughter interested in skilled nursing facility for rehabilitation , transfer to  telemetry floor today  Time Spent in minutes   25 minutes  Procedures:    Consultants:   Cardiology   orthopedics  Antimicrobials:   Doxycycline  Tamiflu    Medications  Scheduled Meds: . amLODipine  10 mg Oral Daily  . arformoterol  15 mcg Nebulization BID  . artificial tears   Both Eyes Q8H  . aspirin EC  81 mg Oral Daily  . budesonide (PULMICORT) nebulizer solution  0.25 mg Nebulization BID  . calcium-vitamin D  1 tablet Oral BID WC  . docusate sodium  100 mg Oral Daily  . doxycycline (VIBRAMYCIN) IV  100 mg Intravenous Q12H  . fluticasone  2 spray Each Nare Daily  . furosemide  40 mg Intravenous Once  . guaiFENesin  600 mg Oral BID  . isosorbide mononitrate  60 mg Oral Daily  . levalbuterol  1.25 mg Nebulization BID  . loratadine  10 mg Oral Daily  . methylPREDNISolone (SOLU-MEDROL) injection  40 mg Intravenous Q12H  . metoprolol succinate  50 mg Oral Daily  . multivitamin with minerals  1 tablet Oral BID  . omega-3 acid ethyl esters  2 capsule Oral BID WC  . oseltamivir  30 mg Oral BID  . pantoprazole  40 mg Oral Daily  . polyethylene glycol  17 g Oral Daily  . sodium polystyrene  30 g Oral Once  . vitamin C  1,000 mg Oral BID   Continuous Infusions: PRN Meds:.acetaminophen **OR** acetaminophen, hydroxypropyl methylcellulose / hypromellose, levalbuterol, morphine injection, ondansetron **OR** ondansetron (ZOFRAN) IV   Antibiotics   Anti-infectives    Start     Dose/Rate Route Frequency Ordered Stop   05/15/16 2315  doxycycline (VIBRAMYCIN) 100 mg in dextrose 5 % 250 mL IVPB     100 mg 125 mL/hr over 120 Minutes Intravenous Every 12 hours 05/15/16 2221     05/15/16 2230  oseltamivir (TAMIFLU) capsule 30 mg     30 mg Oral 2 times daily 05/15/16 2221 05/20/16 2159        Subjective:   [name] was seen and examined today. Feeling better, overall improving still weak. Shortness of breath is improving. Left shoulder still hurting. Patient denies  dizziness, chest pain, abdominal pain, N/V/D/C, new weakness, numbess, tingling. No acute events overnight.    Objective:   Vitals:   05/19/16 0432 05/19/16 0700 05/19/16 0908 05/19/16 1136  BP:  (!) 166/64  (!) 119/46  Pulse:  66  74  Resp:  17  15  Temp:  97.8 F (36.6 C)  97.6 F (36.4 C)  TempSrc:  Oral  Axillary  SpO2:  99% 100% 95%  Weight: 61.5 kg (135 lb 9.3 oz)     Height:        Intake/Output Summary (Last 24 hours) at 05/19/16 1212 Last data filed at 05/19/16 0900  Gross per 24 hour  Intake              860 ml  Output             1150 ml  Net             -290 ml     Wt Readings from Last 3 Encounters:  05/19/16 61.5 kg (135 lb 9.3 oz)  04/22/16 64.2 kg (141 lb 9.6 oz)  03/07/16 65.3 kg (144 lb)     Exam  General: Alert and oriented x 3, NAD  HEENT:   Neck: Supple, no JVD  Cardiovascular: S1 S2 clear  Respiratory:  Bibasilar crackles  Gastrointestinal: Soft, nontender, nondistended, + bowel sounds  Ext: no cyanosis clubbing or edema. Pain with abduction, external rotation of the left shoulder   Neuro: no new deficits  Skin: No rashes  Psych: Normal affect and demeanor, alert and oriented x3    Data Reviewed:  I have personally reviewed following labs and imaging studies  Micro Results Recent Results (from the past 240 hour(s))  MRSA PCR Screening     Status: None   Collection Time: 05/16/16  6:52 AM  Result Value Ref Range Status   MRSA by PCR NEGATIVE NEGATIVE Final    Comment:        The GeneXpert MRSA Assay (FDA approved for NASAL specimens only), is one component of a comprehensive MRSA colonization surveillance program. It is not intended to diagnose MRSA infection nor to guide or monitor treatment for MRSA infections.     Radiology Reports Dg Chest 2 View  Result Date: 05/15/2016 CLINICAL DATA:  Left arm pain starting 3 weeks ago EXAM: CHEST  2 VIEW COMPARISON:  05/12/2016 FINDINGS: Cardiomediastinal silhouette is  stable. Coarse interstitial prominence bilaterally again noted. Status post median sternotomy. Stable bilateral basilar scarring. No infiltrate or pulmonary edema. Again noted significant degenerative changes left shoulder. Again there is sclerosis of left glenoid and humeral head with volume loss and deformity of the humeral head. 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 There is anterior inferior subluxation of humeral head from glenohumeral joint. IMPRESSION: Coarse interstitial prominence bilaterally again noted. Status post median sternotomy. Stable bilateral basilar scarring. No infiltrate or pulmonary edema. Again noted significant degenerative changes left shoulder. Again there is sclerosis of left glenoid and humeral head with volume loss and deformity of the humeral head. 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 There is anterior inferior subluxation of humeral head from glenohumeral joint. Electronically Signed   By: Lahoma Crocker M.D.   On: 05/15/2016 17:42   Dg Chest 2 View  Result Date: 05/12/2016 CLINICAL DATA:  Nonproductive cough. EXAM: CHEST  2 VIEW COMPARISON:  August 21, 2012 FINDINGS: Interval increase in coarsened interstitial markings consistent with progression of the suspected interstitial lung disease seen on not high contrast CT of the chest from July 2014. No nodules or masses. No focal infiltrates. The cardiomediastinal silhouette is stable. No other acute abnormalities. 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 IMPRESSION: Increased coarsening of the bilateral lung interstitium suggests worsening of the interstitial lung disease seen in July 2014. It would be difficult to completely exclude a subtle superimposed infectious process but no definitive focal infiltrate is identified. Electronically Signed   By: Dorise Bullion III M.D   On: 05/12/2016 19:08   Mr Shoulder Left W Wo Contrast  Result Date: 05/18/2016 CLINICAL DATA:  Pt  has had chronic shoulder pain but said it has gotten much worse here lately. EXAM: MRI OF THE LEFT SHOULDER WITHOUT AND WITH CONTRAST TECHNIQUE: Multiplanar, multisequence MR imaging of the LEFT shoulder  was performed before and after the administration of intravenous contrast. CONTRAST:  45mL MULTIHANCE GADOBENATE DIMEGLUMINE 529 MG/ML IV SOLN COMPARISON:  Chest x-ray 08/21/2012 FINDINGS: Patient motion degrades image quality limiting evaluation. Rotator cuff: Complete chronic tear of the supraspinatus, infraspinatus and subscapularis tendons with 5.4 cm of retraction. Intact teres minor. Muscles: Muscle atrophy of the supraspinatus, infraspinatus and subscapularis muscles. Biceps long head: Complete tear of the long head of the biceps tendon. Acromioclavicular Joint: Erosion of the distal clavicle. Chronic severe widening of the acromioclavicular joint. Glenohumeral Joint: Large joint effusion with small areas of T2 hyperintense signal within the fluid and thin peripheral synovial enhancement. Extensive full-thickness cartilage loss of the glenoid and humeral head. Labrum:  Diffuse labral degeneration. Bones: Inferior pseudosubluxation of the humeral head relative to the glenoid secondary to large amount of joint fluid. Extensive erosive changes of the humeral head with osteolysis. Erosive changes of the anterior half of the glenoid. Abnormal concavity of the superior posterior humeral head which can be seen with a Hill-Sachs lesion secondary to prior anterior shoulder dislocation versus severe erosion with subcortical reactive marrow edema. IMPRESSION: 1. Large joint effusion with small areas of T2 hyperintense signal within the fluid and thin peripheral synovial enhancement. Findings are concerning for hemarthrosis. 2. Inferior pseudosubluxation of the humeral head relative to the glenoid secondary to large amount of joint fluid. Extensive erosive changes of the distal clavicle, humeral head and anterior half of  the glenoid. This appearance can be seen with inflammatory or crystalline arthropathy. Septic arthritis is considered less likely. Arthrocentesis of the joint is recommended. 3. Severe osteoarthritis of the left glenohumeral joint. 4. Complete chronic tear of the supraspinatus, infraspinatus and subscapularis tendons with 5.4 cm of retraction. 5. Complete tear of the intraarticular portion of the long head of the biceps tendon. Electronically Signed   By: Kathreen Devoid   On: 05/18/2016 19:56   Dg Shoulder Left  Result Date: 05/12/2016 CLINICAL DATA:  Right shoulder pain. History of fall 8 months ago. Initial encounter. EXAM: LEFT SHOULDER - 2+ VIEW COMPARISON:  None. FINDINGS: No acute bony or joint abnormality is identified. The humeral head is high-riding compatible with chronic rotator cuff tear. Several small loose bodies are identified subjacent to the coracoid process. There is mild acromioclavicular osteoarthritis. Prominent soft tissue attenuation above the acromion measures 2.6 cm transverse by 1.4 cm craniocaudal and is compatible with fluid. IMPRESSION: No acute abnormality. High-riding humeral head consistent with chronic rotator cuff tear. Subcutaneous soft tissue attenuation superior and lateral to the acromion is likely fluid which may have extravasated through the patient's rotator cuff tear could be due to adventitial bursitis. Nonemergent MRI could be used for further evaluation. Electronically Signed   By: Inge Rise M.D.   On: 05/12/2016 19:05   Dg Knee Ap/lat W/sunrise Left  Result Date: 05/12/2016 CLINICAL DATA:  Pain for 1 month. EXAM: LEFT KNEE 3 VIEWS COMPARISON:  None. FINDINGS: No evidence of fracture, dislocation, or joint effusion. No evidence of arthropathy or other focal bone abnormality. Soft tissues are unremarkable. IMPRESSION: Negative. Electronically Signed   By: Dorise Bullion III M.D   On: 05/12/2016 19:05    Lab Data:  CBC:  Recent Labs Lab 05/15/16 1603  05/16/16 0007 05/16/16 0352 05/17/16 0437 05/18/16 0551 05/19/16 0241  WBC 6.5 4.7 5.2 9.7 8.0 7.1  NEUTROABS 5.6  --   --   --   --   --   HGB 12.8 12.1 12.1 11.4* 11.5* 10.9*  HCT 35.5* 34.2* 33.8* 33.1* 33.6* 31.5*  MCV 85.5 85.5 85.4 86.9 87.7 87.5  PLT 201 218 245 244 283 0000000   Basic Metabolic Panel:  Recent Labs Lab 05/17/16 2122 05/18/16 0551 05/18/16 1239 05/18/16 2042 05/19/16 0241  NA 127* 128* 126* 127* 128*  K 5.1 4.8 4.4 5.2* 5.2*  CL 92* 92* 90* 92* 93*  CO2 23 26 25 26 26   GLUCOSE 123* 117* 85 113* 132*  BUN 37* 32* 32* 31* 34*  CREATININE 1.09* 0.86 0.79 1.15* 0.97  CALCIUM 9.1 9.5 9.2 9.0 9.3   GFR: Estimated Creatinine Clearance: 36.3 mL/min (by C-G formula based on SCr of 0.97 mg/dL). Liver Function Tests: No results for input(s): AST, ALT, ALKPHOS, BILITOT, PROT, ALBUMIN in the last 168 hours. No results for input(s): LIPASE, AMYLASE in the last 168 hours. No results for input(s): AMMONIA in the last 168 hours. Coagulation Profile: No results for input(s): INR, PROTIME in the last 168 hours. Cardiac Enzymes:  Recent Labs Lab 05/15/16 2036 05/16/16 0007 05/16/16 0352 05/16/16 1018 05/17/16 0437  TROPONINI 0.07* 0.09* 0.16* 0.41* 0.30*   BNP (last 3 results) No results for input(s): PROBNP in the last 8760 hours. HbA1C: No results for input(s): HGBA1C in the last 72 hours. CBG: No results for input(s): GLUCAP in the last 168 hours. Lipid Profile: No results for input(s): CHOL, HDL, LDLCALC, TRIG, CHOLHDL, LDLDIRECT in the last 72 hours. Thyroid Function Tests: No results for input(s): TSH, T4TOTAL, FREET4, T3FREE, THYROIDAB in the last 72 hours. Anemia Panel: No results for input(s): VITAMINB12, FOLATE, FERRITIN, TIBC, IRON, RETICCTPCT in the last 72 hours. Urine analysis:    Component Value Date/Time   COLORURINE YELLOW 05/18/2016 1537   APPEARANCEUR CLEAR 05/18/2016 1537   APPEARANCEUR Clear 03/07/2016 1428   LABSPEC 1.011  05/18/2016 1537   PHURINE 7.0 05/18/2016 1537   GLUCOSEU NEGATIVE 05/18/2016 1537   HGBUR NEGATIVE 05/18/2016 1537   BILIRUBINUR NEGATIVE 05/18/2016 1537   BILIRUBINUR Negative 03/07/2016 [place] 05/18/2016 1537   PROTEINUR NEGATIVE 05/18/2016 1537   UROBILINOGEN negative 09/25/2013 1632   NITRITE POSITIVE (A) 05/18/2016 1537   LEUKOCYTESUR NEGATIVE 05/18/2016 1537   LEUKOCYTESUR 3+ (A) 03/07/2016 1428     Teasia Zapf M.D. [facility] 05/19/2016, 12:12 PM  Pager: [phone] Between 7am to 7pm - call Pager - [phone]  After 7pm go to [email or web address] - password [email or web address]  Call night coverage person covering after 7pm

## 2016-05-19 NOTE — Progress Notes (Signed)
Received report from [place], RN on 4E for transfer of pt to [phone].

## 2016-05-19 NOTE — Consult Note (Signed)
ORTHOPAEDIC CONSULTATION  REQUESTING PHYSICIAN: Ripudeep Krystal Eaton, MD  PCP:  Anthoney Harada, MD  Chief Complaint: Left shoulder pain  HPI: [name] is a 83 y.o. female who complains of  Left shoulder pain and inability to left overhead.  She states for about 12 years she has had no ability to elevate the left arm overhead.  Over the last few years she has began to have increasing pain of the left shoulder as well.  She denies hx of injury or prior surgery to the left shoulder.  She is RHD.  Currently she is admitted for respiratory failure and the flu.  She has PMHx of CAD with CABG and Oa, GERD, asthma ad recently diagnosed with COPD.  Past Medical History:  Diagnosis Date  . Asthma   . CAD (coronary artery disease)   . Carotid stenosis    40-59% bilateral  . Chronic pain syndrome   . GERD (gastroesophageal reflux disease)   . HLD (hyperlipidemia)   . HTN (hypertension)   . Skin cancer    Past Surgical History:  Procedure Laterality Date  . ABDOMINAL HYSTERECTOMY  1974  . baldder resuspension  1988  . CORONARY ARTERY BYPASS GRAFT  1996   LIMA to LAD, SVG to PDA and SVG to D1   . CORONARY ARTERY BYPASS GRAFT  2010   cath 2 vessel CAD with 2 of 3 bypass grafts occluded. had a patent LIMA to LAD  . skin cancer resected    . TONSILLECTOMY     Social History   Social History  . Marital status: Single    Spouse name: N/A  . Number of children: N/A  . Years of education: N/A   Occupational History  . retired Retired   Social History Main Topics  . Smoking status: Former Smoker    Packs/day: 2.00    Years: 20.00    Types: Cigarettes    Quit date: 10/25/1977  . Smokeless tobacco: Never Used     Comment: smoked 2ppd for 15 years; quit in 1979  . Alcohol use No  . Drug use: No  . Sexual activity: Not Asked   Other Topics Concern  . None   Social History Narrative   Divorced, 2 children; currrantly lives with her daughter and son - in - Sports [employer].     Family History  Problem Relation Age of Onset  . Heart disease Brother     multiple; ealry onset  . Heart disease Sister     multiple; early onset   . Breast cancer Sister   . Colon cancer Sister   . Emphysema Daughter    Allergies  Allergen Reactions  . Lisinopril   . Lunesta [Eszopiclone]   . Simvastatin   . Statins     (Causes weakness in legs and decreases ability to walk.  (lipitor crestor)  . Ultram [Tramadol]    Prior to Admission medications   Medication Sig Start Date End Date Taking? Authorizing Provider  amLODipine (NORVASC) 10 MG tablet Take 1 tablet (10 mg total) by mouth daily. 04/22/16  Yes Sharion Balloon, FNP  aspirin EC 81 MG tablet Take 81 mg by mouth daily.   Yes Historical Provider, MD  azithromycin (ZITHROMAX) 250 MG tablet Take 1 tablet (250 mg total) by mouth daily. Take first 2 tablets together, then 1 every day until finished. 05/12/16  Yes Lysbeth Penner, FNP  benzonatate (TESSALON) 100 MG capsule Take 1 capsule (100 mg total) by mouth every  8 (eight) hours. 05/12/16  Yes Lysbeth Penner, FNP  Calcium Carbonate-Vitamin D (CALCIUM 600/VITAMIN D) 600-400 MG-UNIT per tablet Take 1 tablet by mouth 2 (two) times daily.     Yes Historical Provider, MD  cholecalciferol (VITAMIN D) 400 units TABS tablet Take 400 Units by mouth daily.   Yes Historical Provider, MD  citalopram (CELEXA) 20 MG tablet TAKE 1 TABLET (20 MG TOTAL) BY MOUTH DAILY. 04/22/16  Yes Sharion Balloon, FNP  docusate sodium (COLACE) 100 MG capsule Take 100 mg by mouth daily.   Yes Historical Provider, MD  fluticasone (FLONASE) 50 MCG/ACT nasal spray PLACE 2 SPRAYS INTO THE NOSE DAILY 04/24/16  Yes Sharion Balloon, FNP  guaiFENesin (MUCINEX) 600 MG 12 hr tablet Take 600 mg by mouth.   Yes Historical Provider, MD  HYDROcodone-acetaminophen (NORCO/VICODIN) 5-325 MG tablet Take 1 tablet by mouth every 4 (four) hours as needed. 05/12/16  Yes Lysbeth Penner, FNP  hydroxypropyl methylcellulose /  hypromellose (ISOPTO TEARS / GONIOVISC) 2.5 % ophthalmic solution Place 1 drop into both eyes as needed for dry eyes.   Yes Historical Provider, MD  isosorbide mononitrate (IMDUR) 60 MG 24 hr tablet TAKE 1 TABLET (60 MG TOTAL) BY MOUTH DAILY. 04/22/16  Yes Sharion Balloon, FNP  loratadine (CLARITIN) 10 MG tablet Take 10 mg by mouth daily.     Yes Historical Provider, MD  Melatonin-Pyridoxine (MELATIN) 3-1 MG TABS Take 2 tablets by mouth at bedtime.     Yes Historical Provider, MD  metoprolol succinate (TOPROL-XL) 50 MG 24 hr tablet TAKE 1 TABLET (50 MG TOTAL) BY MOUTH DAILY. 04/22/16  Yes Sharion Balloon, FNP  Multiple Vitamin (MULTIVITAMIN) tablet Take 1 tablet by mouth 2 (two) times daily.    Yes Historical Provider, MD  Omega-3 Fatty Acids (FISH OIL) 1000 MG CAPS Take 2 capsules by mouth 2 (two) times daily.     Yes Historical Provider, MD  omeprazole (PRILOSEC) 20 MG capsule TAKE 1 CAPSULE (20 MG TOTAL) BY MOUTH 2 (TWO) TIMES DAILY BEFORE A MEAL. 04/22/16  Yes Sharion Balloon, FNP  potassium chloride (MICRO-K) 10 MEQ CR capsule TAKE 1 CAPSULE (10 MEQ TOTAL) BY MOUTH DAILY. 04/22/16  Yes Sharion Balloon, FNP  triamterene-hydrochlorothiazide (MAXZIDE) 75-50 MG tablet TAKE 1 TABLET BY MOUTH DAILY. 04/24/16  Yes Sharion Balloon, FNP  vitamin C (ASCORBIC ACID) 500 MG tablet Take 1,000 mg by mouth 2 (two) times daily.     Yes Historical Provider, MD  acetaminophen-codeine (TYLENOL #3) 300-30 MG tablet TAKE 2 TABLETS AT BEDTIME AS NEEDED Patient not taking: Reported on 05/15/2016 04/22/16   Sharion Balloon, FNP  montelukast (SINGULAIR) 10 MG tablet TAKE 1 TABLET (10 MG TOTAL) BY MOUTH AT BEDTIME. Patient not taking: Reported on 05/15/2016 04/22/16   Sharion Balloon, FNP  promethazine (PHENERGAN) 25 MG tablet Take 2 tablets (50 mg total) by mouth at bedtime. Patient not taking: Reported on 05/15/2016 04/22/16   Sharion Balloon, FNP  triamterene-hydrochlorothiazide (MAXZIDE-25) 37.5-25 MG tablet Take 1  tablet by mouth daily. Patient not taking: Reported on 05/15/2016 04/22/16   Sharion Balloon, FNP   Mr Shoulder Left W Wo Contrast  Result Date: 05/18/2016 CLINICAL DATA:  Pt has had chronic shoulder pain but said it has gotten much worse here lately. EXAM: MRI OF THE LEFT SHOULDER WITHOUT AND WITH CONTRAST TECHNIQUE: Multiplanar, multisequence MR imaging of the LEFT shoulder was performed before and after the administration of intravenous contrast. CONTRAST:  81mL MULTIHANCE GADOBENATE DIMEGLUMINE 529 MG/ML IV SOLN COMPARISON:  Chest x-ray 08/21/2012 FINDINGS: Patient motion degrades image quality limiting evaluation. Rotator cuff: Complete chronic tear of the supraspinatus, infraspinatus and subscapularis tendons with 5.4 cm of retraction. Intact teres minor. Muscles: Muscle atrophy of the supraspinatus, infraspinatus and subscapularis muscles. Biceps long head: Complete tear of the long head of the biceps tendon. Acromioclavicular Joint: Erosion of the distal clavicle. Chronic severe widening of the acromioclavicular joint. Glenohumeral Joint: Large joint effusion with small areas of T2 hyperintense signal within the fluid and thin peripheral synovial enhancement. Extensive full-thickness cartilage loss of the glenoid and humeral head. Labrum:  Diffuse labral degeneration. Bones: Inferior pseudosubluxation of the humeral head relative to the glenoid secondary to large amount of joint fluid. Extensive erosive changes of the humeral head with osteolysis. Erosive changes of the anterior half of the glenoid. Abnormal concavity of the superior posterior humeral head which can be seen with a Hill-Sachs lesion secondary to prior anterior shoulder dislocation versus severe erosion with subcortical reactive marrow edema. IMPRESSION: 1. Large joint effusion with small areas of T2 hyperintense signal within the fluid and thin peripheral synovial enhancement. Findings are concerning for hemarthrosis. 2. Inferior  pseudosubluxation of the humeral head relative to the glenoid secondary to large amount of joint fluid. Extensive erosive changes of the distal clavicle, humeral head and anterior half of the glenoid. This appearance can be seen with inflammatory or crystalline arthropathy. Septic arthritis is considered less likely. Arthrocentesis of the joint is recommended. 3. Severe osteoarthritis of the left glenohumeral joint. 4. Complete chronic tear of the supraspinatus, infraspinatus and subscapularis tendons with 5.4 cm of retraction. 5. Complete tear of the intraarticular portion of the long head of the biceps tendon. Electronically Signed   By: Kathreen Devoid   On: 05/18/2016 19:56    Positive ROS: All other systems have been reviewed and were otherwise negative with the exception of those mentioned in the HPI and as above.  Physical Exam: General: Alert, no acute distress Cardiovascular: No pedal edema Respiratory: No cyanosis, no use of accessory musculature GI: No organomegaly, abdomen is soft and non-tender Skin: No lesions in the area of chief complaint Neurologic: Sensation intact distally Psychiatric: Patient is competent for consent with normal mood and affect Lymphatic: No axillary or cervical lymphadenopathy  MUSCULOSKELETAL:  L shoulder- tender at [facility] joint, effusion associated with cuff arthropathy, she has passive elevation of 120, active to only 80, ER 0 and internal to hip.  +NVI distally  Assessment: Left shoulder rotator cuff arthropathy  Plan: -she has advanced rotator cuff arthropathy from chronic massive rotator cuff tear -I have reviewed her MRI, which is consistent with her clinical exam -we discussed options, which for her are injections or reverse shoulder arthroplasty.  She is a poor surgery candidate and is ok with the limited function if the pain can be improved. -at this time her shoulder is not very painful and thus we will see her in the outpatient setting to continue  her workup and management -follow up after dc 3 weeks with Stann Mainland -will sign off    Nicholes Stairs, MD Cell [phone]    05/19/2016 9:30 PM

## 2016-05-19 NOTE — Evaluation (Signed)
Physical Therapy Evaluation Patient Details Name: [name] MRN: [id number] DOB: 19-Aug-1930 Today's Date: 05/19/2016   History of Present Illness  Pt is an 83 y/o female admitted secondary to SOB, + for flu and acute bronchitis. Pt also found to have had a NSTEMI, will be managed conservatively. PMH including but not limited to CAD s/p CABG in 1996 and 2010, HTN, L rotator cuff tear.  Clinical Impression  Pt presented supine in bed with HOB elevated, awake and willing to participate in therapy session. Pt requesting to transfer to [facility] as she needed to have a BM. Prior to admission, pt reported that she was independent with ADLs and occasionally uses a SPC to ambulate. Pt currently requires min guard for safety with bed mobility, min A for stability with transfers and min A for stability with ambulation with one HHA. Pt very cautious with transfers and ambulation secondary to towels on the floor as pt had a BM in bed and continued with loose stool during transfers and standing. PT currently recommending pt d/c home with HHPT. PT will continue to f/u with pt acutely to address her below listed limitations in order to improve her overall safety and independence with functional mobility.     Follow Up Recommendations Home health PT;Supervision - Intermittent    Equipment Recommendations  None recommended by PT    Recommendations for Other Services       Precautions / Restrictions Precautions Precautions: Fall Restrictions Weight Bearing Restrictions: No      Mobility  Bed Mobility Overal bed mobility: Needs Assistance Bed Mobility: Supine to Sit;Sit to Supine     Supine to sit: Min guard;HOB elevated Sit to supine: Min guard   General bed mobility comments: pt required increased time, use of bed rails and min guard for safety  Transfers Overall transfer level: Needs assistance Equipment used: 1 person hand held assist Transfers: Sit to/from [facility] Sit  to Stand: Min assist Stand pivot transfers: Min assist       General transfer comment: pt required increased time and min A for stability with standing and pivotal movements  Ambulation/Gait Ambulation/Gait assistance: Min assist Ambulation Distance (Feet): 2 Feet Assistive device: 1 person hand held assist Gait Pattern/deviations: Step-through pattern;Decreased step length - right;Decreased step length - left;Decreased stride length Gait velocity: decreased Gait velocity interpretation: Below normal speed for age/gender General Gait Details: mild instability requiring min A without an AD. However, pt very cautious secondary to ambulating over and around towels on floor. Pt had a BM in bed and then continued to have loose stool with tranfser to [facility]. Therapist and RN needed to clean up pt, bed and floor.  Stairs            Wheelchair Mobility    Modified Rankin (Stroke Patients Only)       Balance Overall balance assessment: Needs assistance Sitting-balance support: Feet supported;No upper extremity supported Sitting balance-Leahy Scale: Fair     Standing balance support: During functional activity;Single extremity supported Standing balance-Leahy Scale: Poor Standing balance comment: pt requiring at least one UE support to maintain upright standing position                             Pertinent Vitals/Pain Pain Assessment: No/denies pain    Home Living Family/patient expects to be discharged to:: Private residence Living Arrangements: Children Available Help at Discharge: Family;Available PRN/intermittently   Home Access: Level entry  Home Layout: One level Home Equipment: Cane - single point      Prior Function Level of Independence: Independent with assistive device(s)         Comments: pt reported that she occasionally uses a SPC to ambulate. She reported that she is very active and continues to drive.     Hand Dominance         Extremity/Trunk Assessment   Upper Extremity Assessment Upper Extremity Assessment: Overall WFL for tasks assessed    Lower Extremity Assessment Lower Extremity Assessment: Overall WFL for tasks assessed       Communication   Communication: No difficulties  Cognition Arousal/Alertness: Awake/alert Behavior During Therapy: WFL for tasks assessed/performed Overall Cognitive Status: Within Functional Limits for tasks assessed                      General Comments      Exercises     Assessment/Plan    PT Assessment Patient needs continued PT services  PT Problem List Decreased balance;Decreased mobility;Decreased coordination;Decreased knowledge of use of DME;Decreased safety awareness          PT Treatment Interventions DME instruction;Gait training;Stair training;Functional mobility training;Therapeutic exercise;Therapeutic activities;Balance training;Neuromuscular re-education;Patient/family education    PT Goals (Current goals can be found in the Care Plan section)  Acute Rehab PT Goals Patient Stated Goal: return home PT Goal Formulation: With patient Time For Goal Achievement: 06/02/16 Potential to Achieve Goals: Good    Frequency Min 3X/week   Barriers to discharge        Co-evaluation               End of Session   Activity Tolerance: Patient tolerated treatment well Patient left: in bed;with call bell/phone within reach;with nursing/sitter in room;Other (comment) (pt's RN and NT present at end of session) Nurse Communication: Mobility status         Time: [id number] PT Time Calculation (min) (ACUTE ONLY): 28 min   Charges:   PT Evaluation $PT Eval Low Complexity: 1 Procedure PT Treatments $Therapeutic Activity: 8-22 mins   PT G CodesClearnce Sorrel [name] 05/19/2016, 3:46 PM Sherie Don, [place], DPT [phone]

## 2016-05-19 NOTE — Plan of Care (Signed)
Problem: Pain Managment: Goal: General experience of comfort will improve Outcome: Progressing Pt is still complaining of significant pain at this time, however she reports that she does get relief from her current pain regimen.

## 2016-05-20 DIAGNOSIS — Z951 Presence of aortocoronary bypass graft: Secondary | ICD-10-CM

## 2016-05-20 LAB — BASIC METABOLIC PANEL
ANION GAP: 10 (ref 5–15)
BUN: 24 mg/dL — ABNORMAL HIGH (ref 6–20)
CALCIUM: 9.1 mg/dL (ref 8.9–10.3)
CO2: 27 mmol/L (ref 22–32)
CREATININE: 0.8 mg/dL (ref 0.44–1.00)
Chloride: 94 mmol/L — ABNORMAL LOW (ref 101–111)
GFR calc non Af Amer: >60 mL/min (ref >60)
GLUCOSE: 120 mg/dL — AB (ref 65–99)
Potassium: 3.3 mmol/L — ABNORMAL LOW (ref 3.5–5.1)
Sodium: 131 mmol/L — ABNORMAL LOW (ref 135–145)

## 2016-05-20 MED ORDER — PREDNISONE 50 MG PO TABS
50.0000 mg | ORAL_TABLET | Freq: Every day | ORAL | Status: DC
  Administered 2016-05-20: 50 mg via ORAL
  Filled 2016-05-20: qty 1

## 2016-05-20 MED ORDER — FUROSEMIDE 10 MG/ML IJ SOLN
20.0000 mg | Freq: Once | INTRAMUSCULAR | Status: AC
  Administered 2016-05-20: 20 mg via INTRAVENOUS
  Filled 2016-05-20: qty 2

## 2016-05-20 MED ORDER — DOXYCYCLINE HYCLATE 100 MG PO CAPS: 100.0000 mg | ORAL_CAPSULE | Freq: Two times a day (BID) | ORAL | 0 refills | Status: DC

## 2016-05-20 MED ORDER — PREDNISONE 10 MG PO TABS: ORAL_TABLET | ORAL | 0 refills | Status: DC

## 2016-05-20 MED ORDER — ALBUTEROL SULFATE HFA 108 (90 BASE) MCG/ACT IN AERS: 2.0000 | INHALATION_SPRAY | Freq: Four times a day (QID) | RESPIRATORY_TRACT | 3 refills | Status: DC | PRN

## 2016-05-20 MED ORDER — MOMETASONE FURO-FORMOTEROL FUM 100-5 MCG/ACT IN AERO: 2.0000 | INHALATION_SPRAY | Freq: Two times a day (BID) | RESPIRATORY_TRACT | 4 refills | Status: DC

## 2016-05-20 MED ORDER — MOMETASONE FURO-FORMOTEROL FUM 100-5 MCG/ACT IN AERO
2.0000 | INHALATION_SPRAY | Freq: Two times a day (BID) | RESPIRATORY_TRACT | Status: DC
  Administered 2016-05-20: 2 via RESPIRATORY_TRACT
  Filled 2016-05-20: qty 8.8

## 2016-05-20 MED ORDER — POTASSIUM CHLORIDE CRYS ER 20 MEQ PO TBCR
40.0000 meq | EXTENDED_RELEASE_TABLET | Freq: Once | ORAL | Status: AC
  Administered 2016-05-20: 40 meq via ORAL
  Filled 2016-05-20: qty 2

## 2016-05-20 MED ORDER — PREDNISONE 10 MG PO TABS
ORAL_TABLET | ORAL | 0 refills | Status: DC
Start: 2016-05-20 — End: 2016-05-20

## 2016-05-20 NOTE — Progress Notes (Signed)
[place] discharged Home with family and home O2 per MD order.  Discharge instructions reviewed and discussed with the patient. MD paged for pt. Concerns of pain medication and for something to sleep.  Patient will sign d/c sheet when daughter comes to get her, all questions and concerns answered. Copy of instructions, care notes for new diagnosis and medications and scripts will be given to patient once daughter arrives also.  Allergies as of 05/20/2016      Reactions   Lisinopril    Lunesta [eszopiclone]    Simvastatin    Statins    (Causes weakness in legs and decreases ability to walk.  (lipitor crestor)   Ultram [tramadol]       Medication List    STOP taking these medications   acetaminophen-codeine 300-30 MG tablet Commonly known as:  TYLENOL #3   azithromycin 250 MG tablet Commonly known as:  ZITHROMAX   citalopram 20 MG tablet Commonly known as:  CELEXA   HYDROcodone-acetaminophen 5-325 MG tablet Commonly known as:  NORCO/VICODIN   promethazine 25 MG tablet Commonly known as:  PHENERGAN   triamterene-hydrochlorothiazide 37.5-25 MG tablet Commonly known as:  MAXZIDE-25   triamterene-hydrochlorothiazide 75-50 MG tablet Commonly known as:  MAXZIDE     TAKE these medications   albuterol 108 (90 Base) MCG/ACT inhaler Commonly known as:  PROVENTIL HFA;VENTOLIN HFA Inhale 2 puffs into the lungs every 6 (six) hours as needed for wheezing or shortness of breath.   amLODipine 10 MG tablet Commonly known as:  NORVASC Take 1 tablet (10 mg total) by mouth daily.   aspirin EC 81 MG tablet Take 81 mg by mouth daily.   benzonatate 100 MG capsule Commonly known as:  TESSALON Take 1 capsule (100 mg total) by mouth every 8 (eight) hours.   CALCIUM 600/VITAMIN D 600-400 MG-UNIT tablet Generic drug:  Calcium Carbonate-Vitamin D Take 1 tablet by mouth 2 (two) times daily.   cholecalciferol 400 units Tabs tablet Commonly known as:  VITAMIN D Take 400 Units by  mouth daily.   docusate sodium 100 MG capsule Commonly known as:  COLACE Take 100 mg by mouth daily.   doxycycline 100 MG capsule Commonly known as:  VIBRAMYCIN Take 1 capsule (100 mg total) by mouth 2 (two) times daily. X 3 more days   Fish Oil 1000 MG Caps Take 2 capsules by mouth 2 (two) times daily.   fluticasone 50 MCG/ACT nasal spray Commonly known as:  FLONASE PLACE 2 SPRAYS INTO THE NOSE DAILY   guaiFENesin 600 MG 12 hr tablet Commonly known as:  MUCINEX Take 600 mg by mouth.   hydroxypropyl methylcellulose / hypromellose 2.5 % ophthalmic solution Commonly known as:  ISOPTO TEARS / GONIOVISC Place 1 drop into both eyes as needed for dry eyes.   isosorbide mononitrate 60 MG 24 hr tablet Commonly known as:  IMDUR TAKE 1 TABLET (60 MG TOTAL) BY MOUTH DAILY.   loratadine 10 MG tablet Commonly known as:  CLARITIN Take 10 mg by mouth daily.   MELATIN 3-1 MG Tabs Generic drug:  Melatonin-Pyridoxine Take 2 tablets by mouth at bedtime.   metoprolol succinate 50 MG 24 hr tablet Commonly known as:  TOPROL-XL TAKE 1 TABLET (50 MG TOTAL) BY MOUTH DAILY.   mometasone-formoterol 100-5 MCG/ACT Aero Commonly known as:  DULERA Inhale 2 puffs into the lungs 2 (two) times daily.   montelukast 10 MG tablet Commonly known as:  SINGULAIR TAKE 1 TABLET (10 MG TOTAL) BY MOUTH AT BEDTIME.  multivitamin tablet Take 1 tablet by mouth 2 (two) times daily.   omeprazole 20 MG capsule Commonly known as:  PRILOSEC TAKE 1 CAPSULE (20 MG TOTAL) BY MOUTH 2 (TWO) TIMES DAILY BEFORE A MEAL.   potassium chloride 10 MEQ CR capsule Commonly known as:  MICRO-K TAKE 1 CAPSULE (10 MEQ TOTAL) BY MOUTH DAILY.   predniSONE 10 MG tablet Commonly known as:  DELTASONE Prednisone dosing: Take  Prednisone 40mg  (4 tabs) x 3 days, then taper to 30mg  (3 tabs) x 3 days, then 20mg  (2 tabs) x 3days, then 10mg  (1 tab) x 3days, then OFF.   vitamin C 500 MG tablet Commonly known as:  ASCORBIC ACID Take  1,000 mg by mouth 2 (two) times daily.            Durable Medical Equipment        Start     Ordered   05/20/16 1209  For home use only DME 3 n 1  Once     05/20/16 1208   05/20/16 1208  For home use only DME Walker rolling  Once    Comments:  5 inches wheels  Question:  Patient needs a walker to treat with the following condition  Answer:  Gait instability   05/20/16 1208   05/20/16 1109  For home use only DME oxygen  Once    Question Answer Comment  Mode or (Route) Nasal cannula   Liters per Minute 2   Frequency Continuous (stationary and portable oxygen unit needed)   Oxygen conserving device Yes   Oxygen delivery system Gas      05/20/16 1108       IV site discontinued and catheter remains intact. Site without signs and symptoms of complications. Dressing and pressure applied.  Patient will be escorted to car by NT in a wheelchair once daughter arrives,  no distress noted upon discharge.  Mardy Lucier C 05/20/2016 2:15 PM

## 2016-05-20 NOTE — Discharge Summary (Signed)
Physician Discharge Summary   Patient ID: [name] MRN: [id number] DOB/AGE: 08-28-1930 83 y.o.  Admit date: 05/15/2016 Discharge date: 05/20/2016  Primary Care Physician:  Anthoney Harada, MD  Discharge Diagnoses:     Acute respiratory failure with hypoxia   Influenza A bronchitis . Hyponatremia due to SIADH . NSTEMI   . Essential hypertension, benign . Asthma, chronic . Left shoulder pain due to complete rotator cuff tear   Consults:   Cardiology Orthopedics  Recommendations for Outpatient Follow-up:  1. Home health PT, OT, RN, home health aide but arranged by case management 2. Please repeat CBC/BMET at next visit 3. Celexa, triamterene HCTZ discontinued due to hyponatremia   DIET: Heart healthy diet    Allergies:   Allergies  Allergen Reactions  . Lisinopril   . Lunesta [Eszopiclone]   . Simvastatin   . Statins     (Causes weakness in legs and decreases ability to walk.  (lipitor crestor)  . Ultram [Tramadol]      DISCHARGE MEDICATIONS: Current Discharge Medication List    START taking these medications   Details  albuterol (PROVENTIL HFA;VENTOLIN HFA) 108 (90 Base) MCG/ACT inhaler Inhale 2 puffs into the lungs every 6 (six) hours as needed for wheezing or shortness of breath. Qty: 1 Inhaler, Refills: 3    doxycycline (VIBRAMYCIN) 100 MG capsule Take 1 capsule (100 mg total) by mouth 2 (two) times daily. X 3 more days Qty: 6 capsule, Refills: 0    mometasone-formoterol (DULERA) 100-5 MCG/ACT AERO Inhale 2 puffs into the lungs 2 (two) times daily. Qty: 1 Inhaler, Refills: 4    predniSONE (DELTASONE) 10 MG tablet Prednisone dosing: Take  Prednisone 40mg  (4 tabs) x 3 days, then taper to 30mg  (3 tabs) x 3 days, then 20mg  (2 tabs) x 3days, then 10mg  (1 tab) x 3days, then OFF. Qty: 30 tablet, Refills: 0      CONTINUE these medications which have NOT CHANGED   Details  amLODipine (NORVASC) 10 MG tablet Take 1 tablet (10 mg total) by mouth  daily. Qty: 90 tablet, Refills: 2    aspirin EC 81 MG tablet Take 81 mg by mouth daily.    benzonatate (TESSALON) 100 MG capsule Take 1 capsule (100 mg total) by mouth every 8 (eight) hours. Qty: 21 capsule, Refills: 0    Calcium Carbonate-Vitamin D (CALCIUM 600/VITAMIN D) 600-400 MG-UNIT per tablet Take 1 tablet by mouth 2 (two) times daily.      cholecalciferol (VITAMIN D) 400 units TABS tablet Take 400 Units by mouth daily.    docusate sodium (COLACE) 100 MG capsule Take 100 mg by mouth daily.    fluticasone (FLONASE) 50 MCG/ACT nasal spray PLACE 2 SPRAYS INTO THE NOSE DAILY Qty: 48 g, Refills: 1    guaiFENesin (MUCINEX) 600 MG 12 hr tablet Take 600 mg by mouth.    hydroxypropyl methylcellulose / hypromellose (ISOPTO TEARS / GONIOVISC) 2.5 % ophthalmic solution Place 1 drop into both eyes as needed for dry eyes.    isosorbide mononitrate (IMDUR) 60 MG 24 hr tablet TAKE 1 TABLET (60 MG TOTAL) BY MOUTH DAILY. Qty: 90 tablet, Refills: 2    loratadine (CLARITIN) 10 MG tablet Take 10 mg by mouth daily.      Melatonin-Pyridoxine (MELATIN) 3-1 MG TABS Take 2 tablets by mouth at bedtime.      metoprolol succinate (TOPROL-XL) 50 MG 24 hr tablet TAKE 1 TABLET (50 MG TOTAL) BY MOUTH DAILY. Qty: 90 tablet, Refills: 2    Multiple  Vitamin (MULTIVITAMIN) tablet Take 1 tablet by mouth 2 (two) times daily.     Omega-3 Fatty Acids (FISH OIL) 1000 MG CAPS Take 2 capsules by mouth 2 (two) times daily.      omeprazole (PRILOSEC) 20 MG capsule TAKE 1 CAPSULE (20 MG TOTAL) BY MOUTH 2 (TWO) TIMES DAILY BEFORE A MEAL. Qty: 180 capsule, Refills: 1    potassium chloride (MICRO-K) 10 MEQ CR capsule TAKE 1 CAPSULE (10 MEQ TOTAL) BY MOUTH DAILY. Qty: 90 capsule, Refills: 2    vitamin C (ASCORBIC ACID) 500 MG tablet Take 1,000 mg by mouth 2 (two) times daily.      montelukast (SINGULAIR) 10 MG tablet TAKE 1 TABLET (10 MG TOTAL) BY MOUTH AT BEDTIME. Qty: 90 tablet, Refills: 3      STOP taking these  medications     azithromycin (ZITHROMAX) 250 MG tablet      citalopram (CELEXA) 20 MG tablet      HYDROcodone-acetaminophen (NORCO/VICODIN) 5-325 MG tablet      triamterene-hydrochlorothiazide (MAXZIDE) 75-50 MG tablet      acetaminophen-codeine (TYLENOL #3) 300-30 MG tablet      promethazine (PHENERGAN) 25 MG tablet      triamterene-hydrochlorothiazide (MAXZIDE-25) 37.5-25 MG tablet          Brief H and P: For complete details please refer to admission H and P, but in brief Shelly V Bradleyis a 83 y.o.femalewith CAD status post CABG, hypertension, hyperlipidemia who recently moved back to [place] presents to the ER because of shortness of breath and wheezing over the last 1 week. Patient had gone to the urgent care center recently for bronchitis. In the patient was found to be wheezing and short of breath. Chest x-ray showed chronic findings.  EKG showed sinus tachycardia with diffuse ST depression. Labs revealed severe hyponatremia of 119. Patient reported over the last 1 week since she was getting fatigue and weak was drinking more than usual water. Patient in addition also is on Maxzide.Patient was admitted for acute respiratory failure most likely from bronchitis and severe hyponatremia  Hospital Course:  Acute respiratory failure with hypoxiasecondary to influenza And acute bronchitis - Patient was hypoxic on admission requiring  requiring 2 L of oxygen to keep O2 sats above 90%. - Influenza panel was positive for influenza A - Chest x-ray on admission showed increased coarsening of the bilateral lung interstitium, difficult to exclude a subtle superimposed infectious process - Patient was placed on IV Solu-Medrol, Xopenex, doxycycline, Tamiflu - She has finished Tamiflu on the day of discharge. She will be discharged on prednisone taper, doxycycline, Ventolin inhaler, dulera   Severe hyponatremia Due to SIADH - Sodium is 119 on admission. Improving, likely has  underlying SIADH  - Patient states she has been drinking more than usual water. Patient is also on Albert will be discontinued.  - Normal TSH, plasma cortisol is 4.1 but done late at night - Urine osmolarity 417, urine sodium 59, serum osmolarity 277 - Sodium 131 at the time of discharge  - Celexa, Maxide, discontinued   Non-STEMI - Elevated troponin with peak of 0.41, ST segment depression. - Cardiology consulted, patient was on aspirin, beta blockers - Cardiology recommended to discontinue heparin and discussed with family, non-invasive approach was recommended.  Left shoulder pain - Chest x-ray on 1/21 showed synovial chondromatosis anterior inferior subluxation of the humeral head from the glenohumeral joint. Shoulder x-ray consistent with chronic rotator cuff tear - MRI of the left shoulder showed large joint effusion concerning  for hemarthrosis, extensive erosive changes of the distal clavicle, humeral head and anterior half of the glenoid can be seen in inflammatory or crystalline arthropathy, septic arthritis considered less likely. Complete chronic rotator cuff tear. - Orthopedics  was consulted and patient was seen by Dr. Stann Mainland who recommended outpatient follow-up in 3 weeks for further management.   CAD status post CABG  EKG shows ST depression. Will cycle cardiac markers and if positive will start heparin.  Patient is on Toprol and aspirin. Intolerant to statins.  Hypertension - Discontinued Maxzide due to hyponatremia. - Continue Imdur, Toprol and amlodipine.  Generalized debility - PT OT evaluation recommended home health PT OT, arranged by case management      Day of Discharge BP (!) 125/50 (BP Location: Left Arm)   Pulse 82   Temp 97.4 F (36.3 C)   Resp 19   Ht 5\' 1"  (1.549 m)   Wt 58 kg (127 lb 14.4 oz)   SpO2 92%   BMI 24.17 kg/m   Physical Exam: General: Alert and awake oriented x3 not in any acute distress. HEENT: anicteric sclera,  pupils reactive to light and accommodation CVS: S1-S2 clear no murmur rubs or gallops Chest: Decreased breath sounds at the bases Abdomen: soft nontender, nondistended, normal bowel sounds Extremities: no cyanosis, clubbing or edema noted bilaterally Neuro: Cranial nerves II-XII intact, no focal neurological deficits   The results of significant diagnostics from this hospitalization (including imaging, microbiology, ancillary and laboratory) are listed below for reference.    LAB RESULTS: Basic Metabolic Panel:  Recent Labs Lab 05/19/16 0241 05/20/16 0607  NA 128* 131*  K 5.2* 3.3*  CL 93* 94*  CO2 26 27  GLUCOSE 132* 120*  BUN 34* 24*  CREATININE 0.97 0.80  CALCIUM 9.3 9.1   Liver Function Tests: No results for input(s): AST, ALT, ALKPHOS, BILITOT, PROT, ALBUMIN in the last 168 hours. No results for input(s): LIPASE, AMYLASE in the last 168 hours. No results for input(s): AMMONIA in the last 168 hours. CBC:  Recent Labs Lab 05/15/16 1603  05/18/16 0551 05/19/16 0241  WBC 6.5  < > 8.0 7.1  NEUTROABS 5.6  --   --   --   HGB 12.8  < > 11.5* 10.9*  HCT 35.5*  < > 33.6* 31.5*  MCV 85.5  < > 87.7 87.5  PLT 201  < > 283 278  < > = values in this interval not displayed. Cardiac Enzymes:  Recent Labs Lab 05/16/16 1018 05/17/16 0437  TROPONINI 0.41* 0.30*   BNP: Invalid input(s): POCBNP CBG: No results for input(s): GLUCAP in the last 168 hours.  Significant Diagnostic Studies:  Dg Chest 2 View  Result Date: 05/15/2016 CLINICAL DATA:  Left arm pain starting 3 weeks ago EXAM: CHEST  2 VIEW COMPARISON:  05/12/2016 FINDINGS: Cardiomediastinal silhouette is stable. Coarse interstitial prominence bilaterally again noted. Status post median sternotomy. Stable bilateral basilar scarring. No infiltrate or pulmonary edema. Again noted significant degenerative changes left shoulder. Again there is sclerosis of left glenoid and humeral head with volume loss and deformity of  the humeral head. 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 There is anterior inferior subluxation of humeral head from glenohumeral joint. IMPRESSION: Coarse interstitial prominence bilaterally again noted. Status post median sternotomy. Stable bilateral basilar scarring. No infiltrate or pulmonary edema. Again noted significant degenerative changes left shoulder. Again there is sclerosis of left glenoid and humeral head with volume loss and deformity of the humeral head. 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 There is anterior inferior subluxation of humeral head from glenohumeral joint. Electronically Signed   By: Lahoma Crocker M.D.   On: 05/15/2016 17:42      Disposition and Follow-up: Discharge Instructions    Diet - low sodium heart healthy    Complete by:  As directed    Increase activity slowly    Complete by:  As directed        DISPOSITION: home    [place], MD. Schedule an appointment as soon as possible for a visit in 3 week(s).   Specialty:  Orthopedic Surgery Contact information: [address] [place] [address] [phone]        [facility] - [facility] Follow up.   Why:  home DME: oxygen, 3 in 1/ BSC and rollling walker arranged and will be delivered to bedside prior to discharge Contact information: [place] [address] [phone]        Anthoney Harada, MD. Schedule an appointment as soon as possible for a visit in 2 week(s).   Specialty:  Family Medicine Contact information: [place] [address] [phone]        Candee Furbish, MD. Schedule an appointment as soon as possible for a visit in 2 week(s).   Specialty:  Cardiology Contact information: [id number] [place] [address] [phone]            Time spent on Discharge: 25 mins   Signed:   RAI,RIPUDEEP  M.D. [facility] 05/20/2016, 1:34 PM Pager: [phone]

## 2016-05-20 NOTE — Evaluation (Signed)
Occupational Therapy Evaluation Patient Details Name: [name] MRN: [id number] DOB: 1931/02/27 Today's Date: 05/20/2016    History of Present Illness Pt is an 83 y/o female admitted secondary to SOB, + for flu and acute bronchitis. Pt also found to have had a NSTEMI, will be managed conservatively. PMH including but not limited to CAD s/p CABG in 1996 and 2010, HTN, L rotator cuff tear.   Clinical Impression   Patient presenting with decreased ADL and functional mobility independence and safety. Patient independent to mod I PTA. Patient currently functioning at an overall supervision to min assist level. Patient will benefit from acute OT to increase overall independence in the areas of ADLs, functional mobility, and overalls afety in order to safely discharge home with her daughter.     Follow Up Recommendations  No OT follow up;Supervision/Assistance - 24 hour    Equipment Recommendations  3 in 1 bedside commode    Recommendations for Other Services  None at this time   Precautions / Restrictions Precautions Precautions: Fall Restrictions Weight Bearing Restrictions: No    Mobility Bed Mobility Overal bed mobility: Modified Independent Bed Mobility: Sit to Supine           General bed mobility comments: No assist required. Pt was able to transition back to supine without difficulty. No use of rails required.   Transfers Overall transfer level: Needs assistance Equipment used: None Transfers: Sit to/from Stand Sit to Stand: Supervision         General transfer comment: Light supervision for safety. No assist required, and no unsteadiness noted. However, pt furniture walking when not using RW.     Balance Overall balance assessment: Needs assistance Sitting-balance support: Feet supported;No upper extremity supported Sitting balance-Leahy Scale: Good     Standing balance support: No upper extremity supported;During functional activity Standing  balance-Leahy Scale: Fair Standing balance comment: Pt able to stand unsupported and perform peri-care after toileting, as well as wash hands and fix gown without LOB or support.     ADL Overall ADL's : Needs assistance/impaired Eating/Feeding: Set up;Sitting   Grooming: Set up;Sitting   Upper Body Bathing: Set up;Sitting   Lower Body Bathing: Supervison/ safety;Set up;Sit to/from stand   Upper Body Dressing : Set up;Sitting   Lower Body Dressing: Supervision/safety;Set up;Sit to/from stand   Toilet Transfer: Min guard;BSC;Ambulation   Toileting- Water quality scientist and Hygiene: Supervision/safety;Sit to/from Nurse, children's Details (indicate cue type and reason): did not occur Functional mobility during ADLs: Min guard General ADL Comments: Talked with pt regarding recommendation for use of 3-n-1 over toilet seat due to toilet riser not having a way to bolt or screw into seat (per pt report). Also recommeding grab bars in shower and discussed safe way to use suction cup grab bars (wiping suction cup and shower down after each use). Discussed recommendation of patient's daughter being present for showers initially once she goes home. Pt able to cross BLEs for LB ADL.     Pertinent Vitals/Pain Pain Assessment: No/denies pain     Hand Dominance Right   Extremity/Trunk Assessment Upper Extremity Assessment Upper Extremity Assessment: RUE deficits/detail;LUE deficits/detail RUE Deficits / Details: sublux noted, however pt's ROM is WFL LUE Deficits / Details: decreased ROM due to h/o rotator cuff damage    Lower Extremity Assessment Lower Extremity Assessment: Defer to PT evaluation   Cervical / Trunk Assessment Cervical / Trunk Assessment: Normal   Communication Communication Communication: No difficulties   Cognition  Arousal/Alertness: Awake/alert Behavior During Therapy: WFL for tasks assessed/performed Overall Cognitive Status: Within Functional Limits  for tasks assessed              Home Living Family/patient expects to be discharged to:: Private residence Living Arrangements: Children Available Help at Discharge: Family;Available PRN/intermittently Type of Home: House Home Access: Level entry     Home Layout: One level     Bathroom Shower/Tub: Teacher, early years/pre: Standard     Home Equipment: Cane - single point;Toilet riser   Prior Functioning/Environment Level of Independence: Independent with assistive device(s)        Comments: pt reported that she occasionally uses a SPC to ambulate. She reported that she is very active and continues to drive.        OT Problem List: Decreased strength;Decreased range of motion;Decreased activity tolerance;Impaired balance (sitting and/or standing);Decreased safety awareness;Decreased knowledge of use of DME or AE;Decreased knowledge of precautions   OT Treatment/Interventions: Self-care/ADL training;Therapeutic exercise;Energy conservation;DME and/or AE instruction;Therapeutic activities;Patient/family education;Balance training    OT Goals(Current goals can be found in the care plan section) Acute Rehab OT Goals Patient Stated Goal: return home OT Goal Formulation: With patient Time For Goal Achievement: 06/03/16 Potential to Achieve Goals: Good ADL Goals Pt Will Perform Grooming: with modified independence;standing Pt Will Perform Lower Body Dressing: with modified independence;sit to/from stand Pt Will Transfer to Toilet: with modified independence;ambulating;bedside commode Pt Will Perform Tub/Shower Transfer: Tub transfer;ambulating;shower seat;with modified independence  OT Frequency: Min 2X/week   Barriers to D/C: Decreased caregiver support    End of Session Activity Tolerance: Patient tolerated treatment well Patient left: in bed;with call bell/phone within reach   Time: 1400-1416 OT Time Calculation (min): 16 min Charges:  OT General  Charges $OT Visit: 1 Procedure OT Evaluation $OT Eval Moderate Complexity: 1 Procedure  Chrys Racer , MS, OTR/L, CLT  05/20/2016, 2:27 PM

## 2016-05-20 NOTE — Progress Notes (Signed)
Physical Therapy Treatment Patient Details Name: [name] MRN: [id number] DOB: October 16, 1930 Today's Date: 05/20/2016    History of Present Illness Pt is an 83 y/o female admitted secondary to SOB, + for flu and acute bronchitis. Pt also found to have had a NSTEMI, will be managed conservatively. PMH including but not limited to CAD s/p CABG in 1996 and 2010, HTN, L rotator cuff tear.    PT Comments    Pt progressing well towards physical therapy goals. Discussed d/c plans, and pt states she does not wish to go to rehab and feels comfortable returning home. Pt states that between daughter and friends, she will have almost 24 hour assistance if she feels she needs it once she gets home. Pt is currently functioning at a supervision to mod I level, and would benefit from RW use at d/c. Recommending HHPT to follow up to help pt return to PLOF. Will continue to follow.   Follow Up Recommendations  Home health PT;Supervision - Intermittent     Equipment Recommendations  Rolling walker with 5" wheels;3in1 (PT)    Recommendations for Other Services       Precautions / Restrictions Precautions Precautions: Fall Restrictions Weight Bearing Restrictions: No    Mobility  Bed Mobility Overal bed mobility: Modified Independent Bed Mobility: Sit to Supine           General bed mobility comments: No assist required. Pt was able to transition back to supine without difficulty. No use of rails required.   Transfers Overall transfer level: Needs assistance Equipment used: Rolling walker (2 wheeled) Transfers: Sit to/from Stand Sit to Stand: Supervision         General transfer comment: Light supervision for safety. No assist required, and no unsteadiness noted.   Ambulation/Gait Ambulation/Gait assistance: Supervision Ambulation Distance (Feet): 150 Feet Assistive device: Rolling walker (2 wheeled) Gait Pattern/deviations: Step-through pattern;Decreased stride length Gait  velocity: Decreased Gait velocity interpretation: Below normal speed for age/gender General Gait Details: With RW, pt did not require assistance. Light supervision and assist for management of IV pole was provided during gait training   Stairs            Wheelchair Mobility    Modified Rankin (Stroke Patients Only)       Balance Overall balance assessment: Needs assistance Sitting-balance support: Feet supported;No upper extremity supported Sitting balance-Leahy Scale: Good     Standing balance support: No upper extremity supported;During functional activity Standing balance-Leahy Scale: Fair Standing balance comment: Pt able to stand unsupported and perform peri-care after toileting, as well as wash hands and fix gown without LOB or support.                     Cognition Arousal/Alertness: Awake/alert Behavior During Therapy: WFL for tasks assessed/performed Overall Cognitive Status: Within Functional Limits for tasks assessed                      Exercises      General Comments        Pertinent Vitals/Pain Pain Assessment: No/denies pain    Home Living Family/patient expects to be discharged to:: Private residence                    Prior Function            PT Goals (current goals can now be found in the care plan section) Acute Rehab PT Goals Patient Stated Goal: return home PT Goal  Formulation: With patient Time For Goal Achievement: 06/02/16 Potential to Achieve Goals: Good Progress towards PT goals: Progressing toward goals    Frequency    Min 3X/week      PT Plan Current plan remains appropriate    Co-evaluation             End of Session   Activity Tolerance: Patient tolerated treatment well Patient left: in bed;with call bell/phone within reach;with bed alarm set     Time: 0934-1010 PT Time Calculation (min) (ACUTE ONLY): 36 min  Charges:  $Gait Training: 23-37 mins                    G Codes:       [name] 05-27-16, 11:39 AM   [name], PT, DPT Acute Rehabilitation Services Pager: [phone]

## 2016-05-20 NOTE — Progress Notes (Signed)
   05/20/16 1050  Clinical Encounter Type  Visited With Patient  Visit Type Other (Comment) ([facility] consult)  Spiritual Encounters  Spiritual Needs Emotional  Stress Factors  Patient Stress Factors None identified  Introduction to Pt. Assisted Pt with compilation of Advanced Directive. Notarized and witnessed. Original and copy to PT and copy to chart. Gave blessing.

## 2016-05-20 NOTE — Progress Notes (Signed)
SATURATION QUALIFICATIONS: (This note is used to comply with regulatory documentation for home oxygen)  Patient Saturations on Room Air at Rest = 95%  Patient Saturations on Room Air while Ambulating = 86%  Patient Saturations on 2Liters of oxygen while Ambulating = 92%  Please briefly explain why patient needs home oxygen: Pt desaturated while ambulating on RA.

## 2016-05-22 DIAGNOSIS — J45909 Unspecified asthma, uncomplicated: Secondary | ICD-10-CM | POA: Diagnosis not present

## 2016-05-22 DIAGNOSIS — J09X2 Influenza due to identified novel influenza A virus with other respiratory manifestations: Secondary | ICD-10-CM | POA: Diagnosis not present

## 2016-05-22 DIAGNOSIS — M75122 Complete rotator cuff tear or rupture of left shoulder, not specified as traumatic: Secondary | ICD-10-CM | POA: Diagnosis not present

## 2016-05-22 DIAGNOSIS — Z951 Presence of aortocoronary bypass graft: Secondary | ICD-10-CM | POA: Diagnosis not present

## 2016-05-22 DIAGNOSIS — I2510 Atherosclerotic heart disease of native coronary artery without angina pectoris: Secondary | ICD-10-CM | POA: Diagnosis not present

## 2016-05-22 DIAGNOSIS — Z9981 Dependence on supplemental oxygen: Secondary | ICD-10-CM | POA: Diagnosis not present

## 2016-05-22 DIAGNOSIS — J208 Acute bronchitis due to other specified organisms: Secondary | ICD-10-CM | POA: Diagnosis not present

## 2016-05-22 DIAGNOSIS — I214 Non-ST elevation (NSTEMI) myocardial infarction: Secondary | ICD-10-CM | POA: Diagnosis not present

## 2016-05-22 DIAGNOSIS — J9601 Acute respiratory failure with hypoxia: Secondary | ICD-10-CM | POA: Diagnosis not present

## 2016-05-22 DIAGNOSIS — I110 Hypertensive heart disease with heart failure: Secondary | ICD-10-CM | POA: Diagnosis not present

## 2016-05-23 ENCOUNTER — Other Ambulatory Visit: Payer: Self-pay | Admitting: Family

## 2016-05-23 DIAGNOSIS — I2510 Atherosclerotic heart disease of native coronary artery without angina pectoris: Secondary | ICD-10-CM | POA: Diagnosis not present

## 2016-05-23 DIAGNOSIS — J09X2 Influenza due to identified novel influenza A virus with other respiratory manifestations: Secondary | ICD-10-CM | POA: Diagnosis not present

## 2016-05-23 DIAGNOSIS — M75122 Complete rotator cuff tear or rupture of left shoulder, not specified as traumatic: Secondary | ICD-10-CM | POA: Diagnosis not present

## 2016-05-23 DIAGNOSIS — J208 Acute bronchitis due to other specified organisms: Secondary | ICD-10-CM | POA: Diagnosis not present

## 2016-05-23 DIAGNOSIS — I214 Non-ST elevation (NSTEMI) myocardial infarction: Secondary | ICD-10-CM | POA: Diagnosis not present

## 2016-05-23 DIAGNOSIS — I110 Hypertensive heart disease with heart failure: Secondary | ICD-10-CM | POA: Diagnosis not present

## 2016-05-24 DIAGNOSIS — M75122 Complete rotator cuff tear or rupture of left shoulder, not specified as traumatic: Secondary | ICD-10-CM | POA: Diagnosis not present

## 2016-05-24 DIAGNOSIS — I110 Hypertensive heart disease with heart failure: Secondary | ICD-10-CM | POA: Diagnosis not present

## 2016-05-24 DIAGNOSIS — I2510 Atherosclerotic heart disease of native coronary artery without angina pectoris: Secondary | ICD-10-CM | POA: Diagnosis not present

## 2016-05-24 DIAGNOSIS — I214 Non-ST elevation (NSTEMI) myocardial infarction: Secondary | ICD-10-CM | POA: Diagnosis not present

## 2016-05-24 DIAGNOSIS — J09X2 Influenza due to identified novel influenza A virus with other respiratory manifestations: Secondary | ICD-10-CM | POA: Diagnosis not present

## 2016-05-24 DIAGNOSIS — J208 Acute bronchitis due to other specified organisms: Secondary | ICD-10-CM | POA: Diagnosis not present

## 2016-05-25 DIAGNOSIS — M1712 Unilateral primary osteoarthritis, left knee: Secondary | ICD-10-CM | POA: Diagnosis not present

## 2016-05-25 DIAGNOSIS — J09X2 Influenza due to identified novel influenza A virus with other respiratory manifestations: Secondary | ICD-10-CM | POA: Diagnosis not present

## 2016-05-25 DIAGNOSIS — M12812 Other specific arthropathies, not elsewhere classified, left shoulder: Secondary | ICD-10-CM | POA: Diagnosis not present

## 2016-05-25 DIAGNOSIS — J208 Acute bronchitis due to other specified organisms: Secondary | ICD-10-CM | POA: Diagnosis not present

## 2016-05-25 DIAGNOSIS — I214 Non-ST elevation (NSTEMI) myocardial infarction: Secondary | ICD-10-CM | POA: Diagnosis not present

## 2016-05-25 DIAGNOSIS — M75122 Complete rotator cuff tear or rupture of left shoulder, not specified as traumatic: Secondary | ICD-10-CM | POA: Diagnosis not present

## 2016-05-25 DIAGNOSIS — I110 Hypertensive heart disease with heart failure: Secondary | ICD-10-CM | POA: Diagnosis not present

## 2016-05-25 DIAGNOSIS — I2510 Atherosclerotic heart disease of native coronary artery without angina pectoris: Secondary | ICD-10-CM | POA: Diagnosis not present

## 2016-05-26 DIAGNOSIS — Z09 Encounter for follow-up examination after completed treatment for conditions other than malignant neoplasm: Secondary | ICD-10-CM | POA: Diagnosis not present

## 2016-05-26 DIAGNOSIS — E871 Hypo-osmolality and hyponatremia: Secondary | ICD-10-CM | POA: Diagnosis not present

## 2016-05-26 DIAGNOSIS — I214 Non-ST elevation (NSTEMI) myocardial infarction: Secondary | ICD-10-CM | POA: Diagnosis not present

## 2016-05-26 DIAGNOSIS — J09X2 Influenza due to identified novel influenza A virus with other respiratory manifestations: Secondary | ICD-10-CM | POA: Diagnosis not present

## 2016-05-26 DIAGNOSIS — J208 Acute bronchitis due to other specified organisms: Secondary | ICD-10-CM | POA: Diagnosis not present

## 2016-05-26 DIAGNOSIS — R0989 Other specified symptoms and signs involving the circulatory and respiratory systems: Secondary | ICD-10-CM | POA: Diagnosis not present

## 2016-05-26 DIAGNOSIS — I110 Hypertensive heart disease with heart failure: Secondary | ICD-10-CM | POA: Diagnosis not present

## 2016-05-26 DIAGNOSIS — I2510 Atherosclerotic heart disease of native coronary artery without angina pectoris: Secondary | ICD-10-CM | POA: Diagnosis not present

## 2016-05-26 DIAGNOSIS — G933 Postviral fatigue syndrome: Secondary | ICD-10-CM | POA: Diagnosis not present

## 2016-05-26 DIAGNOSIS — M75122 Complete rotator cuff tear or rupture of left shoulder, not specified as traumatic: Secondary | ICD-10-CM | POA: Diagnosis not present

## 2016-05-26 DIAGNOSIS — I10 Essential (primary) hypertension: Secondary | ICD-10-CM | POA: Diagnosis not present

## 2016-05-26 DIAGNOSIS — J4541 Moderate persistent asthma with (acute) exacerbation: Secondary | ICD-10-CM | POA: Diagnosis not present

## 2016-05-27 DIAGNOSIS — I110 Hypertensive heart disease with heart failure: Secondary | ICD-10-CM | POA: Diagnosis not present

## 2016-05-27 DIAGNOSIS — J09X2 Influenza due to identified novel influenza A virus with other respiratory manifestations: Secondary | ICD-10-CM | POA: Diagnosis not present

## 2016-05-27 DIAGNOSIS — I214 Non-ST elevation (NSTEMI) myocardial infarction: Secondary | ICD-10-CM | POA: Diagnosis not present

## 2016-05-27 DIAGNOSIS — M75122 Complete rotator cuff tear or rupture of left shoulder, not specified as traumatic: Secondary | ICD-10-CM | POA: Diagnosis not present

## 2016-05-27 DIAGNOSIS — I2510 Atherosclerotic heart disease of native coronary artery without angina pectoris: Secondary | ICD-10-CM | POA: Diagnosis not present

## 2016-05-27 DIAGNOSIS — J208 Acute bronchitis due to other specified organisms: Secondary | ICD-10-CM | POA: Diagnosis not present

## 2016-05-30 DIAGNOSIS — J09X2 Influenza due to identified novel influenza A virus with other respiratory manifestations: Secondary | ICD-10-CM | POA: Diagnosis not present

## 2016-05-30 DIAGNOSIS — M75122 Complete rotator cuff tear or rupture of left shoulder, not specified as traumatic: Secondary | ICD-10-CM | POA: Diagnosis not present

## 2016-05-30 DIAGNOSIS — J208 Acute bronchitis due to other specified organisms: Secondary | ICD-10-CM | POA: Diagnosis not present

## 2016-05-30 DIAGNOSIS — I2510 Atherosclerotic heart disease of native coronary artery without angina pectoris: Secondary | ICD-10-CM | POA: Diagnosis not present

## 2016-05-30 DIAGNOSIS — I214 Non-ST elevation (NSTEMI) myocardial infarction: Secondary | ICD-10-CM | POA: Diagnosis not present

## 2016-05-30 DIAGNOSIS — I110 Hypertensive heart disease with heart failure: Secondary | ICD-10-CM | POA: Diagnosis not present

## 2016-05-31 DIAGNOSIS — I214 Non-ST elevation (NSTEMI) myocardial infarction: Secondary | ICD-10-CM | POA: Diagnosis not present

## 2016-05-31 DIAGNOSIS — I110 Hypertensive heart disease with heart failure: Secondary | ICD-10-CM | POA: Diagnosis not present

## 2016-05-31 DIAGNOSIS — M75122 Complete rotator cuff tear or rupture of left shoulder, not specified as traumatic: Secondary | ICD-10-CM | POA: Diagnosis not present

## 2016-05-31 DIAGNOSIS — J208 Acute bronchitis due to other specified organisms: Secondary | ICD-10-CM | POA: Diagnosis not present

## 2016-05-31 DIAGNOSIS — J09X2 Influenza due to identified novel influenza A virus with other respiratory manifestations: Secondary | ICD-10-CM | POA: Diagnosis not present

## 2016-05-31 DIAGNOSIS — I2510 Atherosclerotic heart disease of native coronary artery without angina pectoris: Secondary | ICD-10-CM | POA: Diagnosis not present

## 2016-06-01 DIAGNOSIS — J208 Acute bronchitis due to other specified organisms: Secondary | ICD-10-CM | POA: Diagnosis not present

## 2016-06-01 DIAGNOSIS — I2510 Atherosclerotic heart disease of native coronary artery without angina pectoris: Secondary | ICD-10-CM | POA: Diagnosis not present

## 2016-06-01 DIAGNOSIS — J09X2 Influenza due to identified novel influenza A virus with other respiratory manifestations: Secondary | ICD-10-CM | POA: Diagnosis not present

## 2016-06-01 DIAGNOSIS — I214 Non-ST elevation (NSTEMI) myocardial infarction: Secondary | ICD-10-CM | POA: Diagnosis not present

## 2016-06-01 DIAGNOSIS — M75122 Complete rotator cuff tear or rupture of left shoulder, not specified as traumatic: Secondary | ICD-10-CM | POA: Diagnosis not present

## 2016-06-01 DIAGNOSIS — I110 Hypertensive heart disease with heart failure: Secondary | ICD-10-CM | POA: Diagnosis not present

## 2016-06-03 DIAGNOSIS — J208 Acute bronchitis due to other specified organisms: Secondary | ICD-10-CM | POA: Diagnosis not present

## 2016-06-03 DIAGNOSIS — I110 Hypertensive heart disease with heart failure: Secondary | ICD-10-CM | POA: Diagnosis not present

## 2016-06-03 DIAGNOSIS — J09X2 Influenza due to identified novel influenza A virus with other respiratory manifestations: Secondary | ICD-10-CM | POA: Diagnosis not present

## 2016-06-03 DIAGNOSIS — I2510 Atherosclerotic heart disease of native coronary artery without angina pectoris: Secondary | ICD-10-CM | POA: Diagnosis not present

## 2016-06-03 DIAGNOSIS — M75122 Complete rotator cuff tear or rupture of left shoulder, not specified as traumatic: Secondary | ICD-10-CM | POA: Diagnosis not present

## 2016-06-03 DIAGNOSIS — I214 Non-ST elevation (NSTEMI) myocardial infarction: Secondary | ICD-10-CM | POA: Diagnosis not present

## 2016-06-06 DIAGNOSIS — I2510 Atherosclerotic heart disease of native coronary artery without angina pectoris: Secondary | ICD-10-CM | POA: Diagnosis not present

## 2016-06-06 DIAGNOSIS — I110 Hypertensive heart disease with heart failure: Secondary | ICD-10-CM | POA: Diagnosis not present

## 2016-06-06 DIAGNOSIS — J09X2 Influenza due to identified novel influenza A virus with other respiratory manifestations: Secondary | ICD-10-CM | POA: Diagnosis not present

## 2016-06-06 DIAGNOSIS — I214 Non-ST elevation (NSTEMI) myocardial infarction: Secondary | ICD-10-CM | POA: Diagnosis not present

## 2016-06-06 DIAGNOSIS — M75122 Complete rotator cuff tear or rupture of left shoulder, not specified as traumatic: Secondary | ICD-10-CM | POA: Diagnosis not present

## 2016-06-06 DIAGNOSIS — J208 Acute bronchitis due to other specified organisms: Secondary | ICD-10-CM | POA: Diagnosis not present

## 2016-06-07 DIAGNOSIS — M75122 Complete rotator cuff tear or rupture of left shoulder, not specified as traumatic: Secondary | ICD-10-CM | POA: Diagnosis not present

## 2016-06-07 DIAGNOSIS — J09X2 Influenza due to identified novel influenza A virus with other respiratory manifestations: Secondary | ICD-10-CM | POA: Diagnosis not present

## 2016-06-07 DIAGNOSIS — J208 Acute bronchitis due to other specified organisms: Secondary | ICD-10-CM | POA: Diagnosis not present

## 2016-06-07 DIAGNOSIS — I110 Hypertensive heart disease with heart failure: Secondary | ICD-10-CM | POA: Diagnosis not present

## 2016-06-07 DIAGNOSIS — I2510 Atherosclerotic heart disease of native coronary artery without angina pectoris: Secondary | ICD-10-CM | POA: Diagnosis not present

## 2016-06-07 DIAGNOSIS — I214 Non-ST elevation (NSTEMI) myocardial infarction: Secondary | ICD-10-CM | POA: Diagnosis not present

## 2016-06-09 ENCOUNTER — Ambulatory Visit (INDEPENDENT_AMBULATORY_CARE_PROVIDER_SITE_OTHER): Payer: Medicare Other | Admitting: Cardiology

## 2016-06-09 ENCOUNTER — Encounter: Payer: Self-pay | Admitting: Cardiology

## 2016-06-09 VITALS — BP 130/60 | HR 81 | Ht 61.0 in | Wt 132.4 lb

## 2016-06-09 DIAGNOSIS — I2510 Atherosclerotic heart disease of native coronary artery without angina pectoris: Secondary | ICD-10-CM

## 2016-06-09 DIAGNOSIS — E871 Hypo-osmolality and hyponatremia: Secondary | ICD-10-CM | POA: Diagnosis not present

## 2016-06-09 DIAGNOSIS — I6523 Occlusion and stenosis of bilateral carotid arteries: Secondary | ICD-10-CM

## 2016-06-09 DIAGNOSIS — I214 Non-ST elevation (NSTEMI) myocardial infarction: Secondary | ICD-10-CM | POA: Diagnosis not present

## 2016-06-09 NOTE — Progress Notes (Signed)
Cardiology Office Note    Date:  06/09/2016   ID:  [name], DOB 30-Dec-1930, MRN [id number]  PCP:  Anthoney Harada, [name]  Cardiologist: Dr. Percival Spanish (saw in [place])  [name], [name]     History of Present Illness:  [name] is a 83 y.o. female seen last by Dr. Percival Spanish in [employer] in the outpatient setting on 01/30/13 with previous follow-up for dyspnea. I ended up seeing her in the hospital setting in January 2018 for atypical chest pain, left shoulder pain, rotator cuff after fall with mild shortness of breath, hyponatremia which she has had in the past and elevated troponin peak of 0.4 likely demand ischemia. Heparin was administered for 48 hours. Plavix was added. Extensive family discussion, noninvasive approach.   Back in 2010 she had severe three-vessel disease noted on cardiac catheterization post bypass. 2 out of the 3 grafts were occluded (SVG to PDA and SVG to diagonal occluded) disease. LIMA to LAD was widely patent. EF was normal at that time.. At that time, her BNP was mildly elevated with fine crackles on exam as well as mildly abnormal pulmonary function studies with decreased diffusion capacity. Pulmonary medicine saw her and saw scarring on CT scan but apparently no evidence of pulmonary fibrosis. At that time in 2014 she was feeling a lot better. She only felt shortness of breath when hurrying. None at rest. No palpitations, no syncope, no chest pain.   Since hospitalization, she is feeling better. No chest pain. Her shortness of breath is improved. Hopefully she will be coming off of her oxygen soon.   Past Medical History:  Diagnosis Date  . Asthma   . CAD (coronary artery disease)   . Carotid stenosis    40-59% bilateral  . Chronic pain syndrome   . GERD (gastroesophageal reflux disease)   . HLD (hyperlipidemia)   . HTN (hypertension)   . Skin cancer     Past Surgical History:  Procedure Laterality Date  . ABDOMINAL HYSTERECTOMY  1974  .  baldder resuspension  1988  . CORONARY ARTERY BYPASS GRAFT  1996   LIMA to LAD, SVG to PDA and SVG to D1   . CORONARY ARTERY BYPASS GRAFT  2010   cath 2 vessel CAD with 2 of 3 bypass grafts occluded. had a patent LIMA to LAD  . skin cancer resected    . TONSILLECTOMY      Current Medications: Outpatient Medications Prior to Visit  Medication Sig Dispense Refill  . albuterol (PROVENTIL HFA;VENTOLIN HFA) 108 (90 Base) MCG/ACT inhaler Inhale 2 puffs into the lungs every 6 (six) hours as needed for wheezing or shortness of breath. 1 Inhaler 3  . amLODipine (NORVASC) 10 MG tablet Take 1 tablet (10 mg total) by mouth daily. 90 tablet 2  . aspirin EC 81 MG tablet Take 81 mg by mouth daily.    . Calcium Carbonate-Vitamin D (CALCIUM 600/VITAMIN D) 600-400 MG-UNIT per tablet Take 1 tablet by mouth 2 (two) times daily.      . cholecalciferol (VITAMIN D) 400 units TABS tablet Take 400 Units by mouth daily.    Marland Kitchen docusate sodium (COLACE) 100 MG capsule Take 100 mg by mouth daily.    . fluticasone (FLONASE) 50 MCG/ACT nasal spray PLACE 2 SPRAYS INTO THE NOSE DAILY 48 g 1  . guaiFENesin (MUCINEX) 600 MG 12 hr tablet Take 600 mg by mouth.    . hydroxypropyl methylcellulose / hypromellose (ISOPTO TEARS / GONIOVISC) 2.5 % ophthalmic  solution Place 1 drop into both eyes as needed for dry eyes.    . isosorbide mononitrate (IMDUR) 60 MG 24 hr tablet TAKE 1 TABLET (60 MG TOTAL) BY MOUTH DAILY. 90 tablet 2  . loratadine (CLARITIN) 10 MG tablet Take 10 mg by mouth daily.      . Melatonin-Pyridoxine (MELATIN) 3-1 MG TABS Take 2 tablets by mouth at bedtime.      . metoprolol succinate (TOPROL-XL) 50 MG 24 hr tablet TAKE 1 TABLET (50 MG TOTAL) BY MOUTH DAILY. 90 tablet 2  . montelukast (SINGULAIR) 10 MG tablet TAKE 1 TABLET (10 MG TOTAL) BY MOUTH AT BEDTIME. 90 tablet 3  . Multiple Vitamin (MULTIVITAMIN) tablet Take 1 tablet by mouth 2 (two) times daily.     . Omega-3 Fatty Acids (FISH OIL) 1000 MG CAPS Take 2  capsules by mouth 2 (two) times daily.      Marland Kitchen omeprazole (PRILOSEC) 20 MG capsule TAKE 1 CAPSULE (20 MG TOTAL) BY MOUTH 2 (TWO) TIMES DAILY BEFORE A MEAL. 180 capsule 1  . potassium chloride (MICRO-K) 10 MEQ CR capsule TAKE 1 CAPSULE (10 MEQ TOTAL) BY MOUTH DAILY. 90 capsule 2  . vitamin C (ASCORBIC ACID) 500 MG tablet Take 1,000 mg by mouth 2 (two) times daily.      . benzonatate (TESSALON) 100 MG capsule Take 1 capsule (100 mg total) by mouth every 8 (eight) hours. 21 capsule 0  . doxycycline (VIBRAMYCIN) 100 MG capsule Take 1 capsule (100 mg total) by mouth 2 (two) times daily. X 3 more days 6 capsule 0  . metoprolol succinate (TOPROL-XL) 50 MG 24 hr tablet TAKE 1 TABLET (50 MG TOTAL) BY MOUTH DAILY. 90 tablet 0  . mometasone-formoterol (DULERA) 100-5 MCG/ACT AERO Inhale 2 puffs into the lungs 2 (two) times daily. 1 Inhaler 4  . potassium chloride (MICRO-K) 10 MEQ CR capsule TAKE 1 CAPSULE (10 MEQ TOTAL) BY MOUTH DAILY. 90 capsule 0  . predniSONE (DELTASONE) 10 MG tablet Prednisone dosing: Take  Prednisone 40mg  (4 tabs) x 3 days, then taper to 30mg  (3 tabs) x 3 days, then 20mg  (2 tabs) x 3days, then 10mg  (1 tab) x 3days, then OFF. 30 tablet 0   No facility-administered medications prior to visit.      Allergies:   Lunesta [eszopiclone]; Ultram [tramadol]; Lisinopril; Simvastatin; and Statins   Social History   Social History  . Marital status: Single    Spouse name: N/A  . Number of children: N/A  . Years of education: N/A   Occupational History  . retired Retired   Social History Main Topics  . Smoking status: Former Smoker    Packs/day: 2.00    Years: 20.00    Types: Cigarettes    Quit date: 10/25/1977  . Smokeless tobacco: Never Used     Comment: smoked 2ppd for 15 years; quit in 1979  . Alcohol use No  . Drug use: No  . Sexual activity: Not Asked   Other Topics Concern  . None   Social History Narrative   Divorced, 2 children; currrantly lives with her daughter and son  - in - Sports coach.      Family History:  The patient's family history includes Breast cancer in her sister; Colon cancer in her sister; Emphysema in her daughter; Heart disease in her brother and sister.   ROS:   Please see the history of present illness.    ROS All other systems reviewed and are negative.   PHYSICAL EXAM:  VS:  BP 130/60   Pulse 81   Ht 5\' 1"  (1.549 m)   Wt 132 lb 6.4 oz (60.1 kg)   SpO2 97%   BMI 25.02 kg/m    GEN: Well nourished, well developed, in no acute distress  HEENT: normal on [place] O2 Neck: no JVD, carotid bruits, or masses Cardiac: RRR; no murmurs, rubs, or gallops,no edema  Respiratory:  clear to auscultation bilaterally, normal work of breathing GI: soft, nontender, nondistended, + BS MS: no deformity or atrophy  Skin: warm and dry, no rash Neuro:  Alert and Oriented x 3, Strength and sensation are intact Psych: euthymic mood, full affect  Wt Readings from Last 3 Encounters:  06/09/16 132 lb 6.4 oz (60.1 kg)  05/20/16 127 lb 14.4 oz (58 kg)  04/22/16 141 lb 9.6 oz (64.2 kg)      Studies/Labs Reviewed:   EKG:  No new EKG today. Previously EKG in the hospital setting during hypoxia showed sinus tachycardia  Recent Labs: 04/22/2016: ALT 17 05/15/2016: B Natriuretic Peptide 129.2 05/16/2016: TSH 0.950 05/19/2016: Hemoglobin 10.9; Platelets 278 05/20/2016: BUN 24; Creatinine, Ser 0.80; Potassium 3.3; Sodium 131   Lipid Panel    Component Value Date/Time   CHOL 333 (H) 08/15/2013 1138   CHOL 307 (H) 08/21/2012 1427   TRIG 137 08/15/2013 1138   TRIG 142 08/21/2012 1427   HDL 75 08/15/2013 1138   HDL 80 08/21/2012 1427   CHOLHDL 7 02/26/2009 0944   VLDL 20.6 02/26/2009 0944   LDLCALC 231 (H) 08/15/2013 1138   LDLCALC 199 (H) 08/21/2012 1427   LDLDIRECT 238.0 02/26/2009 0944    Additional studies/ records that were reviewed today include:  Hospital records reviewed, prior telemetry, EKG reviewed    ASSESSMENT:    1. Non-ST elevation  myocardial infarction (NSTEMI) ([place])   2. Bilateral carotid artery stenosis   3. Hyponatremia   4. Coronary artery disease involving native coronary artery of native heart without angina pectoris      PLAN:  In order of problems listed above:  Type II non-ST elevation myocardial infarction  - Treated medically in the hospital. Very low level troponin elevation of 0.4.  - Likely secondary to influenza a bronchitis. This resulted in hypoxia.  - Plavix added, beta blocker  - CAD post CABG. ST depression noted on ECG. Intolerant to statins.  - Imdur, Toprol, amlodipine.  - Left shoulder pain from fall, orthopedics consult.  Dyspnea  - Previously did not want any further extensive evaluation. She was following up with both family medicine as well as pulmonary medicine.  Carotid stenosis  - previously moderate bilateral  Hyponatremia  - She was as low as 119 on admission. SIADH. Sodium 131 on discharge. Celexa, Maxide discontinued. Fluid restrictions    Medication Adjustments/Labs and Tests Ordered: Current medicines are reviewed at length with the patient today.  Concerns regarding medicines are outlined above.  Medication changes, Labs and Tests ordered today are listed in the Patient Instructions below. Patient Instructions  Medication Instructions:  The current medical regimen is effective;  continue present plan and medications.  Testing/Procedures: Your physician has requested that you have a carotid duplex. This test is an ultrasound of the carotid arteries in your neck. It looks at blood flow through these arteries that supply the brain with blood. Allow one hour for this exam. There are no restrictions or special instructions.  Follow-Up: Follow up as needed with Dr Marlou Porch  Thank you for choosing [facility]!!  Signed, [name], [name]  06/09/2016 4:35 PM    [address], [place], [place]  [address] Phone: [phone]; Fax: [phone]

## 2016-06-09 NOTE — Patient Instructions (Signed)
Medication Instructions:  The current medical regimen is effective;  continue present plan and medications.  Testing/Procedures: Your physician has requested that you have a carotid duplex. This test is an ultrasound of the carotid arteries in your neck. It looks at blood flow through these arteries that supply the brain with blood. Allow one hour for this exam. There are no restrictions or special instructions.  Follow-Up: Follow up as needed with Dr Marlou Porch  Thank you for choosing [facility]!!

## 2016-06-10 DIAGNOSIS — I110 Hypertensive heart disease with heart failure: Secondary | ICD-10-CM | POA: Diagnosis not present

## 2016-06-10 DIAGNOSIS — M75122 Complete rotator cuff tear or rupture of left shoulder, not specified as traumatic: Secondary | ICD-10-CM | POA: Diagnosis not present

## 2016-06-10 DIAGNOSIS — J09X2 Influenza due to identified novel influenza A virus with other respiratory manifestations: Secondary | ICD-10-CM | POA: Diagnosis not present

## 2016-06-10 DIAGNOSIS — I214 Non-ST elevation (NSTEMI) myocardial infarction: Secondary | ICD-10-CM | POA: Diagnosis not present

## 2016-06-10 DIAGNOSIS — J208 Acute bronchitis due to other specified organisms: Secondary | ICD-10-CM | POA: Diagnosis not present

## 2016-06-10 DIAGNOSIS — I2510 Atherosclerotic heart disease of native coronary artery without angina pectoris: Secondary | ICD-10-CM | POA: Diagnosis not present

## 2016-06-13 DIAGNOSIS — M25551 Pain in right hip: Secondary | ICD-10-CM | POA: Diagnosis not present

## 2016-06-13 DIAGNOSIS — M7061 Trochanteric bursitis, right hip: Secondary | ICD-10-CM | POA: Diagnosis not present

## 2016-06-13 DIAGNOSIS — G8929 Other chronic pain: Secondary | ICD-10-CM | POA: Diagnosis not present

## 2016-06-13 DIAGNOSIS — M545 Low back pain: Secondary | ICD-10-CM | POA: Diagnosis not present

## 2016-06-14 DIAGNOSIS — I110 Hypertensive heart disease with heart failure: Secondary | ICD-10-CM | POA: Diagnosis not present

## 2016-06-14 DIAGNOSIS — I214 Non-ST elevation (NSTEMI) myocardial infarction: Secondary | ICD-10-CM | POA: Diagnosis not present

## 2016-06-14 DIAGNOSIS — J208 Acute bronchitis due to other specified organisms: Secondary | ICD-10-CM | POA: Diagnosis not present

## 2016-06-14 DIAGNOSIS — J09X2 Influenza due to identified novel influenza A virus with other respiratory manifestations: Secondary | ICD-10-CM | POA: Diagnosis not present

## 2016-06-14 DIAGNOSIS — M75122 Complete rotator cuff tear or rupture of left shoulder, not specified as traumatic: Secondary | ICD-10-CM | POA: Diagnosis not present

## 2016-06-14 DIAGNOSIS — I2510 Atherosclerotic heart disease of native coronary artery without angina pectoris: Secondary | ICD-10-CM | POA: Diagnosis not present

## 2016-06-15 DIAGNOSIS — I110 Hypertensive heart disease with heart failure: Secondary | ICD-10-CM | POA: Diagnosis not present

## 2016-06-15 DIAGNOSIS — M75122 Complete rotator cuff tear or rupture of left shoulder, not specified as traumatic: Secondary | ICD-10-CM | POA: Diagnosis not present

## 2016-06-15 DIAGNOSIS — J208 Acute bronchitis due to other specified organisms: Secondary | ICD-10-CM | POA: Diagnosis not present

## 2016-06-15 DIAGNOSIS — I214 Non-ST elevation (NSTEMI) myocardial infarction: Secondary | ICD-10-CM | POA: Diagnosis not present

## 2016-06-15 DIAGNOSIS — I2510 Atherosclerotic heart disease of native coronary artery without angina pectoris: Secondary | ICD-10-CM | POA: Diagnosis not present

## 2016-06-15 DIAGNOSIS — J09X2 Influenza due to identified novel influenza A virus with other respiratory manifestations: Secondary | ICD-10-CM | POA: Diagnosis not present

## 2016-06-21 DIAGNOSIS — I214 Non-ST elevation (NSTEMI) myocardial infarction: Secondary | ICD-10-CM | POA: Diagnosis not present

## 2016-06-21 DIAGNOSIS — J208 Acute bronchitis due to other specified organisms: Secondary | ICD-10-CM | POA: Diagnosis not present

## 2016-06-21 DIAGNOSIS — J09X2 Influenza due to identified novel influenza A virus with other respiratory manifestations: Secondary | ICD-10-CM | POA: Diagnosis not present

## 2016-06-21 DIAGNOSIS — I2510 Atherosclerotic heart disease of native coronary artery without angina pectoris: Secondary | ICD-10-CM | POA: Diagnosis not present

## 2016-06-21 DIAGNOSIS — I110 Hypertensive heart disease with heart failure: Secondary | ICD-10-CM | POA: Diagnosis not present

## 2016-06-21 DIAGNOSIS — M75122 Complete rotator cuff tear or rupture of left shoulder, not specified as traumatic: Secondary | ICD-10-CM | POA: Diagnosis not present

## 2016-06-23 ENCOUNTER — Ambulatory Visit (HOSPITAL_COMMUNITY)
Admission: RE | Admit: 2016-06-23 | Discharge: 2016-06-23 | Disposition: A | Discharge status: Home / Self Care | Payer: Medicare Other | Source: Ambulatory Visit | Attending: Cardiology | Admitting: Cardiology

## 2016-06-23 DIAGNOSIS — M75122 Complete rotator cuff tear or rupture of left shoulder, not specified as traumatic: Secondary | ICD-10-CM | POA: Diagnosis not present

## 2016-06-23 DIAGNOSIS — J4541 Moderate persistent asthma with (acute) exacerbation: Secondary | ICD-10-CM | POA: Diagnosis not present

## 2016-06-23 DIAGNOSIS — E785 Hyperlipidemia, unspecified: Secondary | ICD-10-CM | POA: Diagnosis not present

## 2016-06-23 DIAGNOSIS — I10 Essential (primary) hypertension: Secondary | ICD-10-CM | POA: Insufficient documentation

## 2016-06-23 DIAGNOSIS — Z951 Presence of aortocoronary bypass graft: Secondary | ICD-10-CM | POA: Insufficient documentation

## 2016-06-23 DIAGNOSIS — R0989 Other specified symptoms and signs involving the circulatory and respiratory systems: Secondary | ICD-10-CM | POA: Diagnosis not present

## 2016-06-23 DIAGNOSIS — M170 Bilateral primary osteoarthritis of knee: Secondary | ICD-10-CM | POA: Diagnosis not present

## 2016-06-23 DIAGNOSIS — I6523 Occlusion and stenosis of bilateral carotid arteries: Secondary | ICD-10-CM | POA: Insufficient documentation

## 2016-06-23 DIAGNOSIS — I2510 Atherosclerotic heart disease of native coronary artery without angina pectoris: Secondary | ICD-10-CM | POA: Insufficient documentation

## 2016-06-27 DIAGNOSIS — I110 Hypertensive heart disease with heart failure: Secondary | ICD-10-CM | POA: Diagnosis not present

## 2016-06-27 DIAGNOSIS — M75122 Complete rotator cuff tear or rupture of left shoulder, not specified as traumatic: Secondary | ICD-10-CM | POA: Diagnosis not present

## 2016-06-27 DIAGNOSIS — I2510 Atherosclerotic heart disease of native coronary artery without angina pectoris: Secondary | ICD-10-CM | POA: Diagnosis not present

## 2016-06-27 DIAGNOSIS — J208 Acute bronchitis due to other specified organisms: Secondary | ICD-10-CM | POA: Diagnosis not present

## 2016-06-27 DIAGNOSIS — I214 Non-ST elevation (NSTEMI) myocardial infarction: Secondary | ICD-10-CM | POA: Diagnosis not present

## 2016-06-27 DIAGNOSIS — J09X2 Influenza due to identified novel influenza A virus with other respiratory manifestations: Secondary | ICD-10-CM | POA: Diagnosis not present

## 2016-06-28 DIAGNOSIS — M12812 Other specific arthropathies, not elsewhere classified, left shoulder: Secondary | ICD-10-CM | POA: Diagnosis not present

## 2016-06-28 DIAGNOSIS — M1712 Unilateral primary osteoarthritis, left knee: Secondary | ICD-10-CM | POA: Diagnosis not present

## 2016-07-07 DIAGNOSIS — I214 Non-ST elevation (NSTEMI) myocardial infarction: Secondary | ICD-10-CM | POA: Diagnosis not present

## 2016-07-07 DIAGNOSIS — J208 Acute bronchitis due to other specified organisms: Secondary | ICD-10-CM | POA: Diagnosis not present

## 2016-07-07 DIAGNOSIS — J09X2 Influenza due to identified novel influenza A virus with other respiratory manifestations: Secondary | ICD-10-CM | POA: Diagnosis not present

## 2016-07-07 DIAGNOSIS — I2510 Atherosclerotic heart disease of native coronary artery without angina pectoris: Secondary | ICD-10-CM | POA: Diagnosis not present

## 2016-07-07 DIAGNOSIS — I110 Hypertensive heart disease with heart failure: Secondary | ICD-10-CM | POA: Diagnosis not present

## 2016-07-07 DIAGNOSIS — M75122 Complete rotator cuff tear or rupture of left shoulder, not specified as traumatic: Secondary | ICD-10-CM | POA: Diagnosis not present

## 2016-07-08 DIAGNOSIS — I214 Non-ST elevation (NSTEMI) myocardial infarction: Secondary | ICD-10-CM | POA: Diagnosis not present

## 2016-07-08 DIAGNOSIS — I110 Hypertensive heart disease with heart failure: Secondary | ICD-10-CM | POA: Diagnosis not present

## 2016-07-08 DIAGNOSIS — M75122 Complete rotator cuff tear or rupture of left shoulder, not specified as traumatic: Secondary | ICD-10-CM | POA: Diagnosis not present

## 2016-07-08 DIAGNOSIS — I2510 Atherosclerotic heart disease of native coronary artery without angina pectoris: Secondary | ICD-10-CM | POA: Diagnosis not present

## 2016-07-08 DIAGNOSIS — J208 Acute bronchitis due to other specified organisms: Secondary | ICD-10-CM | POA: Diagnosis not present

## 2016-07-08 DIAGNOSIS — J09X2 Influenza due to identified novel influenza A virus with other respiratory manifestations: Secondary | ICD-10-CM | POA: Diagnosis not present

## 2016-07-14 DIAGNOSIS — I110 Hypertensive heart disease with heart failure: Secondary | ICD-10-CM | POA: Diagnosis not present

## 2016-07-14 DIAGNOSIS — M75122 Complete rotator cuff tear or rupture of left shoulder, not specified as traumatic: Secondary | ICD-10-CM | POA: Diagnosis not present

## 2016-07-14 DIAGNOSIS — I2510 Atherosclerotic heart disease of native coronary artery without angina pectoris: Secondary | ICD-10-CM | POA: Diagnosis not present

## 2016-07-14 DIAGNOSIS — J208 Acute bronchitis due to other specified organisms: Secondary | ICD-10-CM | POA: Diagnosis not present

## 2016-07-14 DIAGNOSIS — I214 Non-ST elevation (NSTEMI) myocardial infarction: Secondary | ICD-10-CM | POA: Diagnosis not present

## 2016-07-14 DIAGNOSIS — J09X2 Influenza due to identified novel influenza A virus with other respiratory manifestations: Secondary | ICD-10-CM | POA: Diagnosis not present

## 2016-07-19 DIAGNOSIS — I214 Non-ST elevation (NSTEMI) myocardial infarction: Secondary | ICD-10-CM | POA: Diagnosis not present

## 2016-07-19 DIAGNOSIS — I110 Hypertensive heart disease with heart failure: Secondary | ICD-10-CM | POA: Diagnosis not present

## 2016-07-19 DIAGNOSIS — J09X2 Influenza due to identified novel influenza A virus with other respiratory manifestations: Secondary | ICD-10-CM | POA: Diagnosis not present

## 2016-07-19 DIAGNOSIS — I2510 Atherosclerotic heart disease of native coronary artery without angina pectoris: Secondary | ICD-10-CM | POA: Diagnosis not present

## 2016-07-19 DIAGNOSIS — J208 Acute bronchitis due to other specified organisms: Secondary | ICD-10-CM | POA: Diagnosis not present

## 2016-07-19 DIAGNOSIS — M75122 Complete rotator cuff tear or rupture of left shoulder, not specified as traumatic: Secondary | ICD-10-CM | POA: Diagnosis not present

## 2016-07-20 DIAGNOSIS — I214 Non-ST elevation (NSTEMI) myocardial infarction: Secondary | ICD-10-CM | POA: Diagnosis not present

## 2016-07-20 DIAGNOSIS — I2510 Atherosclerotic heart disease of native coronary artery without angina pectoris: Secondary | ICD-10-CM | POA: Diagnosis not present

## 2016-07-20 DIAGNOSIS — M75122 Complete rotator cuff tear or rupture of left shoulder, not specified as traumatic: Secondary | ICD-10-CM | POA: Diagnosis not present

## 2016-07-20 DIAGNOSIS — J208 Acute bronchitis due to other specified organisms: Secondary | ICD-10-CM | POA: Diagnosis not present

## 2016-07-20 DIAGNOSIS — J09X2 Influenza due to identified novel influenza A virus with other respiratory manifestations: Secondary | ICD-10-CM | POA: Diagnosis not present

## 2016-07-20 DIAGNOSIS — I110 Hypertensive heart disease with heart failure: Secondary | ICD-10-CM | POA: Diagnosis not present

## 2016-07-26 DIAGNOSIS — I10 Essential (primary) hypertension: Secondary | ICD-10-CM | POA: Diagnosis not present

## 2016-07-26 DIAGNOSIS — R634 Abnormal weight loss: Secondary | ICD-10-CM | POA: Diagnosis not present

## 2016-07-26 DIAGNOSIS — J4541 Moderate persistent asthma with (acute) exacerbation: Secondary | ICD-10-CM | POA: Diagnosis not present

## 2016-07-26 DIAGNOSIS — R0989 Other specified symptoms and signs involving the circulatory and respiratory systems: Secondary | ICD-10-CM | POA: Diagnosis not present

## 2016-07-26 DIAGNOSIS — R0902 Hypoxemia: Secondary | ICD-10-CM | POA: Diagnosis not present

## 2016-07-26 DIAGNOSIS — Z9981 Dependence on supplemental oxygen: Secondary | ICD-10-CM | POA: Diagnosis not present

## 2016-07-26 DIAGNOSIS — M75122 Complete rotator cuff tear or rupture of left shoulder, not specified as traumatic: Secondary | ICD-10-CM | POA: Diagnosis not present

## 2016-07-26 DIAGNOSIS — M170 Bilateral primary osteoarthritis of knee: Secondary | ICD-10-CM | POA: Diagnosis not present

## 2016-07-26 DIAGNOSIS — I2510 Atherosclerotic heart disease of native coronary artery without angina pectoris: Secondary | ICD-10-CM | POA: Diagnosis not present

## 2016-08-18 ENCOUNTER — Other Ambulatory Visit: Payer: Self-pay | Admitting: Family

## 2016-08-26 DIAGNOSIS — M1712 Unilateral primary osteoarthritis, left knee: Secondary | ICD-10-CM | POA: Diagnosis not present

## 2016-08-26 DIAGNOSIS — S83412A Sprain of medial collateral ligament of left knee, initial encounter: Secondary | ICD-10-CM | POA: Diagnosis not present

## 2016-09-02 ENCOUNTER — Encounter: Payer: Self-pay | Admitting: Internal Medicine

## 2016-09-02 ENCOUNTER — Other Ambulatory Visit (INDEPENDENT_AMBULATORY_CARE_PROVIDER_SITE_OTHER): Payer: Medicare Other

## 2016-09-02 ENCOUNTER — Ambulatory Visit (INDEPENDENT_AMBULATORY_CARE_PROVIDER_SITE_OTHER): Payer: Medicare Other | Admitting: Internal Medicine

## 2016-09-02 VITALS — BP 98/58 | HR 73 | Ht 61.0 in | Wt 121.4 lb

## 2016-09-02 DIAGNOSIS — J849 Interstitial pulmonary disease, unspecified: Secondary | ICD-10-CM

## 2016-09-02 DIAGNOSIS — Z825 Family history of asthma and other chronic lower respiratory diseases: Secondary | ICD-10-CM

## 2016-09-02 DIAGNOSIS — J9621 Acute and chronic respiratory failure with hypoxia: Secondary | ICD-10-CM | POA: Diagnosis not present

## 2016-09-02 DIAGNOSIS — R600 Localized edema: Secondary | ICD-10-CM

## 2016-09-02 DIAGNOSIS — Z8709 Personal history of other diseases of the respiratory system: Secondary | ICD-10-CM | POA: Diagnosis not present

## 2016-09-02 DIAGNOSIS — I6523 Occlusion and stenosis of bilateral carotid arteries: Secondary | ICD-10-CM | POA: Diagnosis not present

## 2016-09-02 DIAGNOSIS — J84112 Idiopathic pulmonary fibrosis: Secondary | ICD-10-CM | POA: Insufficient documentation

## 2016-09-02 LAB — CARDIAC PANEL
CK MB: 1.3 ng/mL (ref 0.3–4.0)
Relative Index: 4.1 calc — ABNORMAL HIGH (ref 0.0–2.5)
Total CK: 31 U/L (ref 7–177)

## 2016-09-02 LAB — ANGIOTENSIN CONVERTING ENZYME: Angiotensin-Converting Enzyme: 35 U/L (ref 9–67)

## 2016-09-02 LAB — SEDIMENTATION RATE: Sed Rate: 24 mm/hr (ref 0–30)

## 2016-09-02 NOTE — Progress Notes (Signed)
Subjective:    Patient ID: [name], female    DOB: February 01, 1931, 83 y.o.   MRN: [id number]  PCP Vernie Shanks, MD   HPI   IOV 09/02/2016  Chief Complaint  Patient presents with  . Pulmonary Consult    interstitial changes on xray, on 2L of O2, previous Dr. Gwenette Greet patient, SOB with activity    83 year old female referred by her primary care physician. Accompanied by her daughter. History is gained from her and review of the primary care physician chart and summarized. Also reviewed Dr. Lupita Leash  notes in pulmonary clinic in 2014. She is a new patient because it has been more than 3 years since she visited here. Review of the chart shows that then 2014 she had isolated reduction in diffusion capacity to 55% and with a CT scan of the chest done July 2014 and documented below. I personally visualized the film and the pattern is not consistent with UIP. She tells me that she was asymptomatic at that point. She says the only reason for the workup back then was that because one of her daughters lives in [place] had COPD and she incidentally found herself to have low pulse oximetry while in [place]. But because she was asymptomatic she said she did not follow-up further. In the interim doing really well doing groceries, driving car, and having independent activities of daily life. Then in January 2018 suffered influenza A abruptly and since then has had a new low baseline requiring 2 L of oxygen continuously and home oxygen DME company is unable to wean her off. She desaturates into the 70s according to the daughter and the patient. In addition around the same time she has noticed that she's had a leftward deviation of her left knee and has had to apply compression stockings to it and is using a walker to walk. Both function status and her oxygen status of symptoms below baseline. Therefore she is here today. The primary care physician note confirms the same. There is no prior history of deep vein  thrombosis of blood clots or pulmonary embolism. She's not on any anticoagulation. She is not on any immunosuppression.  11/14/2012 pulmonary function test shows isolated reduction in diffusion capacity to 55%.  Most recent hemoglobin 10.9 g percent Jan 2018. January 2008 and creatinine of 0.8 mg percent. She had admission for influenza A - JAn  2018  IMPRESSION: 1.  The appearance of the lung parenchyma is suggestive of an underlying interstitial lung disease, however, the pattern is not definitive at this time.  While there is a subpleural reticulation and potential early evidence of honeycombing, this suspected honeycombing is not yet definitive.  Additionally, there is lack of a well-defined craniocaudal gradient.  These findings can be seen in the setting of NSIP (nonspecific interstitial pneumonia), or early UIP (usual interstitial pneumonia).  Follow-up high- resolution chest CT and 6-12 months would be useful to assess for temporal changes in the appearance of the lung parenchyma. 2.  There is also mild to moderate centrilobular emphysema. 3.  Mild dilatation of the pulmonic trunk (3.6 cm in diameter), suggestive of pulmonary arterial hypertension. 4.  Evidence of mild air trapping, suggesting small airways disease. 5.  Atherosclerosis, including left main and three-vessel coronary artery disease.   Status post median sternotomy for CABG including LIMA to LAD. 6. Multiple tiny calcifications in the visualized upper poles of the kidneys bilaterally may represent vascular calculi calcifications or may represent tiny nonobstructive calculi.  Original Report Authenticated By: Vinnie Langton, M.D.   has a past medical history of Asthma; CAD (coronary artery disease); Carotid stenosis; Chronic pain syndrome; GERD (gastroesophageal reflux disease); HLD (hyperlipidemia); HTN (hypertension); and Skin cancer.   reports that she quit smoking about 38 years ago. Her smoking use  included Cigarettes. She has a 40.00 pack-year smoking history. She has never used smokeless tobacco.  Past Surgical History:  Procedure Laterality Date  . ABDOMINAL HYSTERECTOMY  1974  . baldder resuspension  1988  . CORONARY ARTERY BYPASS GRAFT  1996   LIMA to LAD, SVG to PDA and SVG to D1   . CORONARY ARTERY BYPASS GRAFT  2010   cath 2 vessel CAD with 2 of 3 bypass grafts occluded. had a patent LIMA to LAD  . skin cancer resected    . TONSILLECTOMY      Allergies  Allergen Reactions  . Lunesta [Eszopiclone]     Didn't work  . Ultram [Tramadol] Itching  . Lisinopril   . Simvastatin   . Statins     (Causes weakness in legs and decreases ability to walk.  (lipitor crestor)    Immunization History  Administered Date(s) Administered  . Influenza Split 04/25/2012  . Influenza, High Dose Seasonal PF 03/24/2014, 03/09/2015, 01/27/2016  . Influenza-Unspecified 12/24/2012  . Pneumococcal Conjugate-13 02/12/2015  . Pneumococcal-Unspecified 04/26/2003  . Tdap 08/24/2010    Family History  Problem Relation Age of Onset  . Heart disease Brother        multiple; ealry onset  . Heart disease Sister        multiple; early onset   . Breast cancer Sister   . Colon cancer Sister   . Emphysema Daughter      Current Outpatient Prescriptions:  .  albuterol (PROVENTIL HFA;VENTOLIN HFA) 108 (90 Base) MCG/ACT inhaler, Inhale 2 puffs into the lungs every 6 (six) hours as needed for wheezing or shortness of breath., Disp: 1 Inhaler, Rfl: 3 .  amLODipine (NORVASC) 10 MG tablet, Take 1 tablet (10 mg total) by mouth daily., Disp: 90 tablet, Rfl: 2 .  aspirin EC 81 MG tablet, Take 81 mg by mouth daily., Disp: , Rfl:  .  BREO ELLIPTA 100-25 MCG/INH AEPB, Inhale 1 puff into the lungs daily., Disp: , Rfl: 3 .  Calcium Carbonate-Vitamin D (CALCIUM 600/VITAMIN D) 600-400 MG-UNIT per tablet, Take 1 tablet by mouth 2 (two) times daily.  , Disp: , Rfl:  .  cholecalciferol (VITAMIN D) 400 units TABS  tablet, Take 400 Units by mouth daily., Disp: , Rfl:  .  docusate sodium (COLACE) 100 MG capsule, Take 100 mg by mouth daily., Disp: , Rfl:  .  fluticasone (FLONASE) 50 MCG/ACT nasal spray, PLACE 2 SPRAYS INTO THE NOSE DAILY, Disp: 48 g, Rfl: 1 .  guaiFENesin (MUCINEX) 600 MG 12 hr tablet, Take 600 mg by mouth., Disp: , Rfl:  .  hydroxypropyl methylcellulose / hypromellose (ISOPTO TEARS / GONIOVISC) 2.5 % ophthalmic solution, Place 1 drop into both eyes as needed for dry eyes., Disp: , Rfl:  .  isosorbide mononitrate (IMDUR) 60 MG 24 hr tablet, TAKE 1 TABLET (60 MG TOTAL) BY MOUTH DAILY., Disp: 90 tablet, Rfl: 2 .  loratadine (CLARITIN) 10 MG tablet, Take 10 mg by mouth daily.  , Disp: , Rfl:  .  Melatonin-Pyridoxine (MELATIN) 3-1 MG TABS, Take 2 tablets by mouth at bedtime.  , Disp: , Rfl:  .  metoprolol succinate (TOPROL-XL) 50 MG 24 hr tablet, TAKE 1  TABLET (50 MG TOTAL) BY MOUTH DAILY., Disp: 90 tablet, Rfl: 2 .  montelukast (SINGULAIR) 10 MG tablet, TAKE 1 TABLET (10 MG TOTAL) BY MOUTH AT BEDTIME., Disp: 90 tablet, Rfl: 3 .  Multiple Vitamin (MULTIVITAMIN) tablet, Take 1 tablet by mouth 2 (two) times daily. , Disp: , Rfl:  .  Omega-3 Fatty Acids (FISH OIL) 1000 MG CAPS, Take 2 capsules by mouth 2 (two) times daily.  , Disp: , Rfl:  .  omeprazole (PRILOSEC) 20 MG capsule, TAKE 1 CAPSULE (20 MG TOTAL) BY MOUTH 2 (TWO) TIMES DAILY BEFORE A MEAL., Disp: 180 capsule, Rfl: 1 .  potassium chloride (MICRO-K) 10 MEQ CR capsule, TAKE 1 CAPSULE (10 MEQ TOTAL) BY MOUTH DAILY., Disp: 90 capsule, Rfl: 2 .  vitamin C (ASCORBIC ACID) 500 MG tablet, Take 1,000 mg by mouth 2 (two) times daily.  , Disp: , Rfl:     Review of Systems  Constitutional: Negative for fever and unexpected weight change.  HENT: Negative for congestion, dental problem, ear pain, nosebleeds, postnasal drip, rhinorrhea, sinus pressure, sneezing, sore throat and trouble swallowing.   Eyes: Negative for redness and itching.  Respiratory:  Positive for shortness of breath. Negative for cough, chest tightness and wheezing.   Cardiovascular: Negative for palpitations and leg swelling.  Gastrointestinal: Negative for nausea and vomiting.  Genitourinary: Negative for dysuria.  Musculoskeletal: Negative for joint swelling.  Skin: Negative for rash.  Neurological: Negative for headaches.  Hematological: Does not bruise/bleed easily.  Psychiatric/Behavioral: Negative for dysphoric mood. The patient is not nervous/anxious.        Objective:   Physical Exam  Constitutional: She is oriented to person, place, and time. She appears well-developed and well-nourished. No distress.  eldelry female sittingon wheel chair  HENT:  Head: Normocephalic and atraumatic.  Right Ear: External ear normal.  Left Ear: External ear normal.  Mouth/Throat: Oropharynx is clear and moist. No oropharyngeal exudate.  o2 on  Eyes: Conjunctivae and EOM are normal. Pupils are equal, round, and reactive to light. Right eye exhibits no discharge. Left eye exhibits no discharge. No scleral icterus.  Neck: Normal range of motion. Neck supple. No JVD present. No tracheal deviation present. No thyromegaly present.  Cardiovascular: Normal rate, regular rhythm, normal heart sounds and intact distal pulses.  Exam reveals no gallop and no friction rub.   No murmur heard. Pulmonary/Chest: Effort normal. No respiratory distress. She has no wheezes. She has rales. She exhibits no tenderness.  creackles bilateral UL And bilateral LL No resp distress  Abdominal: Soft. Bowel sounds are normal. She exhibits no distension and no mass. There is no tenderness. There is no rebound and no guarding.  Musculoskeletal: Normal range of motion. She exhibits no edema or tenderness.  Left knee lateral deviation Left calf with edema +  Lymphadenopathy:    She has no cervical adenopathy.  Neurological: She is alert and oriented to person, place, and time. She has normal reflexes. No  cranial nerve deficit. She exhibits normal muscle tone. Coordination normal.  Skin: Skin is warm and dry. No rash noted. She is not diaphoretic. No erythema. No pallor.  Aging skin No dry hands  Psychiatric: She has a normal mood and affect. Her behavior is normal. Judgment and thought content normal.  Vitals reviewed.  Vitals:   09/02/16 1055  BP: (!) 98/58  Pulse: 73  SpO2: 97%  Weight: 121 lb 6.4 oz (55.1 kg)  Height: _0  (1.549 m)    Estimated body mass index  is 22.94 kg/m as calculated from the following:   Height as of this encounter: _0  (1.549 m).   Weight as of this encounter: 121 lb 6.4 oz (55.1 kg).        Assessment & Plan:     ICD-9-CM ICD-10-CM   1. Acute on chronic respiratory failure with hypoxemia (HCC) 518.84 J96.21 Alpha-1 antitrypsin phenotype  2. History of influenza V12.09 Z87.09   3. Edema of left lower extremity 782.3 R60.0   4. ILD (interstitial lung disease) ([place]) 515 J84.9   5. Family history of COPD (chronic obstructive pulmonary disease) V17.6 Z82.5 Alpha-1 antitrypsin phenotype     Suspect ILD flare with new low baseline If ILD , unclear why  You have it  PLAN Check room air pulse ox now with forehead probe 09/02/2016 - regardless of result continue o2 for now at home Check d-dimer blood work 09/02/2016 Check Serum: ESR, ACE, ANA, DS-DNA, RF, anti-CCP, ssA, ssB, scl-70 Total CK,  RNP, Aldolase,  Hypersensitivity Pneumonitis Panel Check alpha 1 Do Duplex Lower extremity Do HRCT supine and prone  Followup  - return to see me or my APP Tammy Parrrett next days to few weeks to review results    Dr. Brand Males, M.D., [facility].C.P Pulmonary and Critical Care Medicine Staff Physician [place] [facility] Pager: [phone], If no answer or between  15:00h - 7:00h: call [phone]  09/02/2016 11:44 AM   The

## 2016-09-02 NOTE — Patient Instructions (Addendum)
ICD-9-CM ICD-10-CM   1. Acute on chronic respiratory failure with hypoxemia (HCC) 518.84 J96.21 Alpha-1 antitrypsin phenotype  2. History of influenza V12.09 Z87.09   3. Edema of left lower extremity 782.3 R60.0   4. ILD (interstitial lung disease) ([place]) 515 J84.9   5. Family history of COPD (chronic obstructive pulmonary disease) V17.6 Z82.5 Alpha-1 antitrypsin phenotype   Suspect ILD flare with new low baseline If ILD , unclear why  You have it  PLAN Check room air pulse ox now with forehead probe 09/02/2016 - regardless of result continue o2 for now at home Check d-dimer blood work 09/02/2016 Check Serum: ESR, ACE, ANA, DS-DNA, RF, anti-CCP, ssA, ssB, scl-70 Total CK,  RNP, Aldolase,  Hypersensitivity Pneumonitis Panel Check alpha 1 Do Duplex Lower extremity Do HRCT supine and prone  Followup  - return to see me or my APP Tammy Parrrett next days to few weeks to review results

## 2016-09-03 LAB — D-DIMER, QUANTITATIVE: D-Dimer, Quant: 4.42 mcg/mL FEU — ABNORMAL HIGH (ref <0.50)

## 2016-09-03 LAB — RNP ANTIBODIES: ENA RNP Ab: <0.2 AI (ref 0.0–0.9)

## 2016-09-05 ENCOUNTER — Inpatient Hospital Stay (HOSPITAL_COMMUNITY): Admission: RE | Admit: 2016-09-05 | Payer: Medicare Other | Source: Ambulatory Visit

## 2016-09-05 LAB — CYCLIC CITRUL PEPTIDE ANTIBODY, IGG

## 2016-09-05 LAB — SJOGRENS SYNDROME-B EXTRACTABLE NUCLEAR ANTIBODY: SSB (LA) (ENA) ANTIBODY, IGG: NEGATIVE

## 2016-09-05 LAB — RHEUMATOID FACTOR: Rheumatoid fact SerPl-aCnc: 33 IU/mL — ABNORMAL HIGH (ref <14)

## 2016-09-05 LAB — ANTI-SCLERODERMA ANTIBODY: SCLERODERMA (SCL-70) (ENA) ANTIBODY, IGG: NEGATIVE

## 2016-09-05 LAB — ANTI-DNA ANTIBODY, DOUBLE-STRANDED: ds DNA Ab: 1 IU/mL

## 2016-09-05 LAB — SJOGRENS SYNDROME-A EXTRACTABLE NUCLEAR ANTIBODY: SSA (Ro) (ENA) Antibody, IgG: <1

## 2016-09-06 DIAGNOSIS — S83412A Sprain of medial collateral ligament of left knee, initial encounter: Secondary | ICD-10-CM | POA: Diagnosis not present

## 2016-09-06 LAB — ALDOLASE: ALDOLASE: 2.8 U/L (ref <=8.1)

## 2016-09-07 LAB — ALPHA-1 ANTITRYPSIN PHENOTYPE: A1 ANTITRYPSIN: 155 mg/dL (ref 83–199)

## 2016-09-08 ENCOUNTER — Ambulatory Visit (INDEPENDENT_AMBULATORY_CARE_PROVIDER_SITE_OTHER)
Admission: RE | Admit: 2016-09-08 | Discharge: 2016-09-08 | Disposition: A | Discharge status: Home / Self Care | Payer: Medicare Other | Source: Ambulatory Visit | Attending: Internal Medicine | Admitting: Internal Medicine

## 2016-09-08 DIAGNOSIS — R0602 Shortness of breath: Secondary | ICD-10-CM | POA: Diagnosis not present

## 2016-09-08 DIAGNOSIS — J849 Interstitial pulmonary disease, unspecified: Secondary | ICD-10-CM

## 2016-09-08 LAB — HYPERSENSITIVITY PNUEMONITIS PROFILE

## 2016-09-13 DIAGNOSIS — M1712 Unilateral primary osteoarthritis, left knee: Secondary | ICD-10-CM | POA: Diagnosis not present

## 2016-09-22 ENCOUNTER — Ambulatory Visit (HOSPITAL_COMMUNITY)
Admission: RE | Admit: 2016-09-22 | Discharge: 2016-09-22 | Disposition: A | Discharge status: Home / Self Care | Payer: Medicare Other | Source: Ambulatory Visit | Attending: Cardiovascular Disease | Admitting: Cardiovascular Disease

## 2016-09-22 DIAGNOSIS — R600 Localized edema: Secondary | ICD-10-CM | POA: Diagnosis not present

## 2016-09-23 ENCOUNTER — Ambulatory Visit (INDEPENDENT_AMBULATORY_CARE_PROVIDER_SITE_OTHER): Payer: Medicare Other | Admitting: Internal Medicine

## 2016-09-23 ENCOUNTER — Telehealth: Payer: Self-pay | Admitting: Internal Medicine

## 2016-09-23 ENCOUNTER — Encounter: Payer: Self-pay | Admitting: Internal Medicine

## 2016-09-23 DIAGNOSIS — J8410 Pulmonary fibrosis, unspecified: Secondary | ICD-10-CM | POA: Diagnosis not present

## 2016-09-23 DIAGNOSIS — J84112 Idiopathic pulmonary fibrosis: Secondary | ICD-10-CM

## 2016-09-23 DIAGNOSIS — I6523 Occlusion and stenosis of bilateral carotid arteries: Secondary | ICD-10-CM

## 2016-09-23 DIAGNOSIS — J439 Emphysema, unspecified: Secondary | ICD-10-CM | POA: Insufficient documentation

## 2016-09-23 NOTE — Progress Notes (Signed)
Subjective:     Patient ID: [name], female   DOB: [birth year], 83 y.o.   MRN: [id number]  PCP Shelly Shanks, MD  HPI     PCP Shelly Shanks, MD   HPI   IOV 09/02/2016  Chief Complaint  Patient presents with  . Pulmonary Consult    interstitial changes on xray, on 2L of O2, previous Dr. Gwenette French patient, SOB with activity    83 year old female referred by her primary care physician. Accompanied by her daughter. History is gained from her and review of the primary care physician chart and summarized. Also reviewed Dr. Lupita French  notes in pulmonary clinic in 2014. She is a new patient because it has been more than 3 years since she visited here. Review of the chart shows that then 2014 she had isolated reduction in diffusion capacity to 55% and with a CT scan of the chest done July 2014 and documented below. I personally visualized the film and the pattern is not consistent with UIP. She tells me that she was asymptomatic at that point. She says the only reason for the workup back then was that because one of her daughters lives in [place] had COPD and she incidentally found herself to have low pulse oximetry while in [place]. But because she was asymptomatic she said she did not follow-up further. In the interim doing really well doing groceries, driving car, and having independent activities of daily life. Then in January 2018 suffered influenza A abruptly and since then has had a new low baseline requiring 2 L of oxygen continuously and home oxygen DME company is unable to wean her off. She desaturates into the 70s according to the daughter and the patient. In addition around the same time she has noticed that she's had a leftward deviation of her left knee and has had to apply compression stockings to it and is using a walker to walk. Both function status and her oxygen status of symptoms below baseline. Therefore she is here today. The primary care physician note confirms the same. There is  no prior history of deep vein thrombosis of blood clots or pulmonary embolism. She's not on any anticoagulation. She is not on any immunosuppression.  11/14/2012 pulmonary function test shows isolated reduction in diffusion capacity to 55%.  Most recent hemoglobin 10.9 g percent Jan 2018. January 2008 and creatinine of 0.8 mg percent. She had admission for influenza A - JAn  2018  IMPRESSION: 1.  The appearance of the lung parenchyma is suggestive of an underlying interstitial lung disease, however, the pattern is not definitive at this time.  While there is a subpleural reticulation and potential early evidence of honeycombing, this suspected honeycombing is not yet definitive.  Additionally, there is lack of a well-defined craniocaudal gradient.  These findings can be seen in the setting of NSIP (nonspecific interstitial pneumonia), or early UIP (usual interstitial pneumonia).  Follow-up high- resolution chest CT and 6-12 months would be useful to assess for temporal changes in the appearance of the lung parenchyma. 2.  There is also mild to moderate centrilobular emphysema. 3.  Mild dilatation of the pulmonic trunk (3.6 cm in diameter), suggestive of pulmonary arterial hypertension. 4.  Evidence of mild air trapping, suggesting small airways disease. 5.  Atherosclerosis, including left main and three-vessel coronary artery disease.   Status post median sternotomy for CABG including LIMA to LAD. 6. Multiple tiny calcifications in the visualized upper poles of the kidneys bilaterally may represent vascular  calculi calcifications or may represent tiny nonobstructive calculi.   Original Report Authenticated By: [name], M.D. July 2014  IOV 09/23/2016  Chief Complaint  Patient presents with  . Follow-up    review labs and ct.  pt states she is doing well, does note sob with exertion.      Here to discuss test results  A. Autoimmune negative B. Alpha 1 normal C. 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 Duplex - neg for dvt but left calf has a non vascular prominence - she wears brace and si decodnitoined -=they will d.w pcp Shelly Shanks, MD E. Still on 2L [place] and stable but still dealing with left knee meniscal tear   alpah 1 - MM 09/02/16  Results for Shelly, French (MRN [id number]) as of 09/23/2016 11:22  Ref. Range 09/02/2016 12:13  Aspergillus flavus Unknown REPORT  A fumigatus #1 Unknown REPORT  A pullulans Unknown REPORT  [place] Serum Unknown REPORT  Aspergillus [place] Unknown REPORT  Angiotensin-Converting Enzyme Latest Ref Range: 9 - 67 U/L 35  Cyclic Citrullin Peptide Ab Latest Units: Units <16  ds DNA Ab Latest Units: IU/mL 1  ENA RNP Ab Latest Ref Range: 0.0 - 0.9 AI <0.2  RA Latex Turbid. Latest Ref Range: <14 IU/mL 33 (H)  SSA (Ro) (ENA) Antibody, IgG Latest Ref Range: <1.0 NEG AI  <1.0 NEG  SSB (La) (ENA) Antibody, IgG Latest Ref Range: <1.0 NEG AI  <1.0 NEG  Scleroderma (Scl-70) (ENA) Antibody, IgG Latest Ref Range: <1.0 NEG AI  <1.0 NEG     has a past medical history of Asthma; CAD (coronary artery disease); Carotid stenosis; Chronic pain syndrome; GERD (gastroesophageal reflux disease); HLD (hyperlipidemia); HTN (hypertension); and Skin cancer.   reports that she quit smoking about 38 years ago. Her smoking use included Cigarettes. She has a 40.00 pack-year smoking history. She has never used smokeless tobacco.  Past Surgical History:  Procedure Laterality Date  . ABDOMINAL HYSTERECTOMY  1974  . baldder resuspension  1988  . CORONARY ARTERY BYPASS GRAFT  1996   LIMA to LAD, SVG to PDA and SVG to D1   . CORONARY ARTERY BYPASS GRAFT  2010   cath 2 vessel CAD with 2 of 3 bypass grafts occluded. had a patent LIMA to LAD  . skin cancer resected    . TONSILLECTOMY      Allergies  Allergen Reactions  . Lunesta [Eszopiclone]      Didn't work  . Ultram [Tramadol] Itching  . Lisinopril   . Simvastatin   . Statins     (Causes weakness in legs and decreases ability to walk.  (lipitor crestor)    Immunization History  Administered Date(s) Administered  . Influenza Split 04/25/2012  . Influenza, High Dose Seasonal PF 03/24/2014, 03/09/2015, 01/27/2016  . Influenza-Unspecified 12/24/2012  . Pneumococcal Conjugate-13 02/12/2015  . Pneumococcal-Unspecified 04/26/2003  . Tdap 08/24/2010    Family History  Problem Relation Age of Onset  . Heart disease Brother        multiple; ealry onset  . Heart disease Sister        multiple; early onset   . Breast cancer Sister   . Colon cancer Sister   . Emphysema Daughter      Current Outpatient Prescriptions:  .  albuterol (PROVENTIL HFA;VENTOLIN HFA) 108 (90 Base) MCG/ACT inhaler, Inhale 2 puffs into the lungs every 6 (six)  hours as needed for wheezing or shortness of breath., Disp: 1 Inhaler, Rfl: 3 .  amLODipine (NORVASC) 10 MG tablet, Take 1 tablet (10 mg total) by mouth daily., Disp: 90 tablet, Rfl: 2 .  aspirin EC 81 MG tablet, Take 81 mg by mouth daily., Disp: , Rfl:  .  BREO ELLIPTA 100-25 MCG/INH AEPB, Inhale 1 puff into the lungs daily., Disp: , Rfl: 3 .  Calcium Carbonate-Vitamin D (CALCIUM 600/VITAMIN D) 600-400 MG-UNIT per tablet, Take 1 tablet by mouth 2 (two) times daily.  , Disp: , Rfl:  .  cholecalciferol (VITAMIN D) 400 units TABS tablet, Take 400 Units by mouth daily., Disp: , Rfl:  .  docusate sodium (COLACE) 100 MG capsule, Take 100 mg by mouth daily., Disp: , Rfl:  .  fluticasone (FLONASE) 50 MCG/ACT nasal spray, PLACE 2 SPRAYS INTO THE NOSE DAILY, Disp: 48 g, Rfl: 1 .  guaiFENesin (MUCINEX) 600 MG 12 hr tablet, Take 600 mg by mouth., Disp: , Rfl:  .  hydroxypropyl methylcellulose / hypromellose (ISOPTO TEARS / GONIOVISC) 2.5 % ophthalmic solution, Place 1 drop into both eyes as needed for dry eyes., Disp: , Rfl:  .  isosorbide mononitrate (IMDUR) 60  MG 24 hr tablet, TAKE 1 TABLET (60 MG TOTAL) BY MOUTH DAILY., Disp: 90 tablet, Rfl: 2 .  loratadine (CLARITIN) 10 MG tablet, Take 10 mg by mouth daily.  , Disp: , Rfl:  .  Melatonin-Pyridoxine (MELATIN) 3-1 MG TABS, Take 2 tablets by mouth at bedtime.  , Disp: , Rfl:  .  metoprolol succinate (TOPROL-XL) 50 MG 24 hr tablet, TAKE 1 TABLET (50 MG TOTAL) BY MOUTH DAILY., Disp: 90 tablet, Rfl: 2 .  montelukast (SINGULAIR) 10 MG tablet, TAKE 1 TABLET (10 MG TOTAL) BY MOUTH AT BEDTIME., Disp: 90 tablet, Rfl: 3 .  Multiple Vitamin (MULTIVITAMIN) tablet, Take 1 tablet by mouth 2 (two) times daily. , Disp: , Rfl:  .  Omega-3 Fatty Acids (FISH OIL) 1000 MG CAPS, Take 2 capsules by mouth 2 (two) times daily.  , Disp: , Rfl:  .  omeprazole (PRILOSEC) 20 MG capsule, TAKE 1 CAPSULE (20 MG TOTAL) BY MOUTH 2 (TWO) TIMES DAILY BEFORE A MEAL., Disp: 180 capsule, Rfl: 1 .  potassium chloride (MICRO-K) 10 MEQ CR capsule, TAKE 1 CAPSULE (10 MEQ TOTAL) BY MOUTH DAILY., Disp: 90 capsule, Rfl: 2 .  vitamin C (ASCORBIC ACID) 500 MG tablet, Take 1,000 mg by mouth 2 (two) times daily.  , Disp: , Rfl:     Review of Systems     Objective:   Physical Exam Vitals:   09/23/16 1107  BP: 126/62  Pulse: 67  SpO2: 97%  Weight: 120 lb (54.4 kg)  Height: 5\' 1"  (1.549 m)   Frail 02 on wheel chair  Estimated body mass index is 22.67 kg/m as calculated from the following:   Height as of this encounter: 5\' 1"  (1.549 m).   Weight as of this encounter: 120 lb (54.4 kg).     Assessment:       ICD-9-CM ICD-10-CM   1. Pulmonary emphysema with fibrosis of lung (HCC) 492.8 J43.9    515 J84.10   2. IPF (idiopathic pulmonary fibrosis) (HCC) 516.31 J84.112        Plan:     Pulmonary emphysema (HCC) contnue breo and singulair  IPF (idiopathic pulmonary fibrosis) (HCC)  Re IPF - dX - I think yo [place] IPF based on age, possible UIP on CT, and negative autoimune profile.  Bx will be risky . So this will be a clinical  dx - Course   - progressive disease in almost all patients (> 90%); typically few to several year progression   - super unlukcy 10% - progress to death in a year   - super lucky  10% - stability > 5 years or even rarely 10 years   -unpredictable in each individual - Rx:  anti-fibrotics + since 2014 but they can only slow disease down; preventative. Not Rx symptoms  - they have side effects including intolerance is < 1/3rd patients  - most 55-65% patients tolerate them well  - further details below - Other pillars of management  - Symptoms - cough and dyspnea RX - currently we will watch  - O2 - definitely helps symptoms so contnue to use  - Rehab - definitely helps symptoms and conditioning but currentll you are not eligibile due to your left knee issue and brace  - Transplant - you are not eligible  - Pulmonary Trials: highly encourage participation in rsearch to help development of bette treatment  - Patient Support Group    - http://[place].com/ \   - https://smith-[place].net/   Anti-fibrotic Drugs Both drugs OFEV and Esbriet only slow down progression, 1 out of 6 patients  - this means extension in quality of life but no difference in symptoms  - no study directly compares the 2 drugs but efficacy roughly equal at 1 year time point   - OFEV   - do not recommend due to prior heart attack   - ESBRIET  - 3 pill three times daily, slow titration is full dose but for you max is 2 pills three times daily - Need to wean sunscreen when exposed to sun  - Some chance of nausea and anorexia with small chance for diarrhea  - no known heart attack risk - no known bleeding risk,   - need monthly blood work for 6 months - monitor liver function - possible mortality benefit in pooled analysis   Followup 6-8 weeks or sooner if needed    > 50% of this > 25  min visit spent in face to face counseling or coordination of care    Dr. Brand Males, M.D., [facility].C.P Pulmonary and Critical Care Medicine Staff Physician [place] [facility]monary [facility] Pager: [phone], If no answer or between  15:00h - 7:00h: call [phone]  09/23/2016 11:51 AM

## 2016-09-23 NOTE — Telephone Encounter (Signed)
[name]  Ensure fu 09/23/2016 0 myriad abnormalities    - CT with ILD but also osteolytic rib lesions (autoimmune negative)  - duplex with negative dVT but mid-calf left side unclear lesion - I would need to examine this area   Dr. Brand Males, M.D., [facility].C.P Pulmonary and Critical Care Medicine Staff Physician [place] [facility] Critical [facility] Pager: [phone], If no answer or between  15:00h - 7:00h: call [phone]  09/23/2016 3:55 AM

## 2016-09-23 NOTE — Patient Instructions (Signed)
Pulmonary emphysema (HCC) contnue breo and singulair  IPF (idiopathic pulmonary fibrosis) (HCC)  Re IPF - Course   - progressive disease in almost all patients (> 90%); typically few to several year progression   - super unlukcy 10% - progress to death in a year   - super lucky  10% - stability > 5 years or even rarely 10 years   -unpredictable in each individual - Rx:  anti-fibrotics + since 2014 but they can only slow disease down; preventative. Not Rx symptoms  - they have side effects including intolerance is < 1/3rd patients  - most 55-65% patients tolerate them well  - further details below - Other pillars of management  - Symptoms - cough and dyspnea RX  - O2 - definitely helps symptoms  - Rehab - definitely helps symptoms and conditioning but currentll you are not eligibile  - Transplant - you are not eligible  - Pulmonary Trials: highly encourage participation in rsearch to help development of bette treatment  - Patient Support Group    - http://[place].com/ \   - https://smith-[place].net/   Anti-fibrotic Drugs Both drugs OFEV and Esbriet only slow down progression, 1 out of 6 patients  - this means extension in quality of life but no difference in symptoms  - no study directly compares the 2 drugs but efficacy roughly equal at 1 year time point   - OFEV   - do not recommend due to prior heart attack   - ESBRIET  - 3 pill three times daily, slow titration is full dose but for you max is 2 pills three times daily - Need to wean sunscreen when exposed to sun  - Some chance of nausea and anorexia with small chance for diarrhea  - no known heart attack risk - no known bleeding risk,   - need monthly blood work for 6 months - monitor liver function - possible mortality benefit in pooled analysis   Followup 6-8 weeks or sooner if  needed

## 2016-09-23 NOTE — Assessment & Plan Note (Addendum)
  Re IPF - dX - I think yo [place] IPF based on age, possible UIP on CT, and negative autoimune profile. Bx will be risky . So this will be a clinical dx - Course   - progressive disease in almost all patients (> 90%); typically few to several year progression   - super unlukcy 10% - progress to death in a year   - super lucky  10% - stability > 5 years or even rarely 10 years   -unpredictable in each individual - Rx:  anti-fibrotics + since 2014 but they can only slow disease down; preventative. Not Rx symptoms.  AGREE THAT YOU WANT TO THINK ABOUT IT - brochure given  - they have side effects including intolerance is < 1/3rd patients  - most 55-65% patients tolerate them well  - further details below - Other pillars of management  - Symptoms - cough and dyspnea RX - currently we will watch  - O2 - definitely helps symptoms so contnue to use  - Rehab - definitely helps symptoms and conditioning but currentll you are not eligibile due to your left knee issue and brace  - Transplant - you are not eligible  - Pulmonary Trials: highly encourage participation in rsearch to help development of bette treatment  - Patient Support Group    - http://[place].com/ \   - https://smith-[place].net/   Anti-fibrotic Drugs Both drugs OFEV and Esbriet only slow down progression, 1 out of 6 patients  - this means extension in quality of life but no difference in symptoms  - no study directly compares the 2 drugs but efficacy roughly equal at 1 year time point   - OFEV   - do not recommend due to prior heart attack   - ESBRIET  - 3 pill three times daily, slow titration is full dose but for you max is 2 pills three times daily - Need to wean sunscreen when exposed to sun  - Some chance of nausea and anorexia with small chance for diarrhea  - no known heart attack risk - no  known bleeding risk,   - need monthly blood work for 6 months - monitor liver function - possible mortality benefit in pooled analysis   Followup 6-8 weeks or sooner if needed

## 2016-09-23 NOTE — Assessment & Plan Note (Signed)
contnue breo and singulair

## 2016-09-23 NOTE — Telephone Encounter (Signed)
Pt was seen today 09/23/16 by MR. Will sign off.

## 2016-09-27 ENCOUNTER — Telehealth: Payer: Self-pay | Admitting: Internal Medicine

## 2016-09-27 MED ORDER — BREO ELLIPTA 100-25 MCG/INH IN AEPB: 1.0000 | INHALATION_SPRAY | Freq: Every day | RESPIRATORY_TRACT | 3 refills | Status: DC

## 2016-09-27 NOTE — Telephone Encounter (Signed)
lmtcb X1 for pt's daughter Daine Floras (dpr on file) breo sent to preferred pharmacy.

## 2016-09-28 NOTE — Telephone Encounter (Signed)
Called and left a detailed message on [name]'s VM to make her aware of rx that has been sent to the pharmacy.

## 2016-10-01 ENCOUNTER — Other Ambulatory Visit: Payer: Self-pay | Admitting: Family

## 2016-10-07 DIAGNOSIS — Z9981 Dependence on supplemental oxygen: Secondary | ICD-10-CM | POA: Diagnosis not present

## 2016-10-07 DIAGNOSIS — J4541 Moderate persistent asthma with (acute) exacerbation: Secondary | ICD-10-CM | POA: Diagnosis not present

## 2016-10-07 DIAGNOSIS — I10 Essential (primary) hypertension: Secondary | ICD-10-CM | POA: Diagnosis not present

## 2016-10-07 DIAGNOSIS — J84112 Idiopathic pulmonary fibrosis: Secondary | ICD-10-CM | POA: Diagnosis not present

## 2016-10-07 DIAGNOSIS — I2510 Atherosclerotic heart disease of native coronary artery without angina pectoris: Secondary | ICD-10-CM | POA: Diagnosis not present

## 2016-10-07 DIAGNOSIS — R0902 Hypoxemia: Secondary | ICD-10-CM | POA: Diagnosis not present

## 2016-10-07 DIAGNOSIS — R0989 Other specified symptoms and signs involving the circulatory and respiratory systems: Secondary | ICD-10-CM | POA: Diagnosis not present

## 2016-10-07 DIAGNOSIS — Z5181 Encounter for therapeutic drug level monitoring: Secondary | ICD-10-CM | POA: Diagnosis not present

## 2016-10-07 DIAGNOSIS — R634 Abnormal weight loss: Secondary | ICD-10-CM | POA: Diagnosis not present

## 2016-10-07 DIAGNOSIS — M75122 Complete rotator cuff tear or rupture of left shoulder, not specified as traumatic: Secondary | ICD-10-CM | POA: Diagnosis not present

## 2016-10-07 DIAGNOSIS — M170 Bilateral primary osteoarthritis of knee: Secondary | ICD-10-CM | POA: Diagnosis not present

## 2016-10-13 DIAGNOSIS — M1712 Unilateral primary osteoarthritis, left knee: Secondary | ICD-10-CM | POA: Diagnosis not present

## 2016-10-20 DIAGNOSIS — M1712 Unilateral primary osteoarthritis, left knee: Secondary | ICD-10-CM | POA: Diagnosis not present

## 2016-10-20 DIAGNOSIS — S83412D Sprain of medial collateral ligament of left knee, subsequent encounter: Secondary | ICD-10-CM | POA: Diagnosis not present

## 2016-11-10 DIAGNOSIS — M1712 Unilateral primary osteoarthritis, left knee: Secondary | ICD-10-CM | POA: Diagnosis not present

## 2016-11-15 ENCOUNTER — Telehealth (HOSPITAL_COMMUNITY): Payer: Self-pay | Admitting: *Deleted

## 2016-11-15 ENCOUNTER — Encounter: Payer: Self-pay | Admitting: Cardiology

## 2016-11-15 ENCOUNTER — Ambulatory Visit (INDEPENDENT_AMBULATORY_CARE_PROVIDER_SITE_OTHER): Payer: Medicare Other | Admitting: Cardiology

## 2016-11-15 VITALS — BP 100/54 | HR 69 | Ht 61.0 in

## 2016-11-15 DIAGNOSIS — E871 Hypo-osmolality and hyponatremia: Secondary | ICD-10-CM

## 2016-11-15 DIAGNOSIS — Z01810 Encounter for preprocedural cardiovascular examination: Secondary | ICD-10-CM | POA: Diagnosis not present

## 2016-11-15 DIAGNOSIS — I6523 Occlusion and stenosis of bilateral carotid arteries: Secondary | ICD-10-CM | POA: Diagnosis not present

## 2016-11-15 DIAGNOSIS — Z951 Presence of aortocoronary bypass graft: Secondary | ICD-10-CM

## 2016-11-15 DIAGNOSIS — R0602 Shortness of breath: Secondary | ICD-10-CM | POA: Diagnosis not present

## 2016-11-15 DIAGNOSIS — I2510 Atherosclerotic heart disease of native coronary artery without angina pectoris: Secondary | ICD-10-CM

## 2016-11-15 NOTE — Telephone Encounter (Signed)
Patient given detailed instructions per Myocardial Perfusion Study Information Sheet for the test on 11/17/16 at 7:15. Patient notified to arrive 15 minutes early and that it is imperative to arrive on time for appointment to keep from having the test rescheduled.  If you need to cancel or reschedule your appointment, please call the office within 24 hours of your appointment. . Patient verbalized understanding.[name]

## 2016-11-15 NOTE — Progress Notes (Signed)
Cardiology Office Note    Date:  11/15/2016   ID:  [name], DOB 1931/03/29, MRN [id number]  PCP:  Vernie Shanks, MD  Cardiologist: Dr. Percival Spanish (saw in [place])  Candee Furbish, MD     History of Present Illness:  [name] is a 83 y.o. female seen last by Dr. Percival Spanish in [place] in the outpatient setting on 01/30/13 with previous follow-up for dyspnea Here for preoperative left knee risk stratification at the request of Dr. Alvan Dame.   I ended up seeing her in the hospital setting in [id number] for atypical chest pain, left shoulder pain, rotator cuff after fall with mild shortness of breath, hyponatremia which she has had in the past and elevated troponin peak of 0.4 likely demand ischemia. Heparin was administered for 48 hours. Plavix was added. Extensive family discussion, noninvasive approach at that time.   Back in 2010 she had severe three-vessel disease noted on cardiac catheterization post bypass. 2 out of the 3 grafts were occluded (SVG to PDA and SVG to diagonal occluded) disease. LIMA to LAD was widely patent. EF was normal at that time.. At that time, her BNP was mildly elevated with fine crackles on exam as well as mildly abnormal pulmonary function studies with decreased diffusion capacity. Pulmonary medicine saw her and saw scarring on CT scan but apparently no evidence of pulmonary fibrosis. At that time in 2014 she was feeling a lot better. She only felt shortness of breath when hurrying. None at rest. No palpitations, no syncope, no chest pain.  Today, she states that she is overall at baseline in fact her cough has improved, her breathing perhaps has improved as well. Her main complaint is her left knee pain which is debilitating. She is able to get to the bathroom but other than that, does not perform much activity. She is in a wheelchair today. She denies any chest pain, syncope, palpitations, excessive shortness of breath.   Past Medical History:  Diagnosis Date   . Asthma   . CAD (coronary artery disease)   . Carotid stenosis    40-59% bilateral  . Chronic pain syndrome   . GERD (gastroesophageal reflux disease)   . HLD (hyperlipidemia)   . HTN (hypertension)   . Skin cancer     Past Surgical History:  Procedure Laterality Date  . ABDOMINAL HYSTERECTOMY  1974  . baldder resuspension  1988  . CORONARY ARTERY BYPASS GRAFT  1996   LIMA to LAD, SVG to PDA and SVG to D1   . CORONARY ARTERY BYPASS GRAFT  2010   cath 2 vessel CAD with 2 of 3 bypass grafts occluded. had a patent LIMA to LAD  . skin cancer resected    . TONSILLECTOMY      Current Medications: Outpatient Medications Prior to Visit  Medication Sig Dispense Refill  . albuterol (PROVENTIL HFA;VENTOLIN HFA) 108 (90 Base) MCG/ACT inhaler Inhale 2 puffs into the lungs every 6 (six) hours as needed for wheezing or shortness of breath. 1 Inhaler 3  . amLODipine (NORVASC) 10 MG tablet Take 1 tablet (10 mg total) by mouth daily. 90 tablet 2  . aspirin EC 81 MG tablet Take 81 mg by mouth daily.    Marland Kitchen BREO ELLIPTA 100-25 MCG/INH AEPB Inhale 1 puff into the lungs daily. 60 each 3  . Calcium Carbonate-Vitamin D (CALCIUM 600/VITAMIN D) 600-400 MG-UNIT per tablet Take 1 tablet by mouth 2 (two) times daily.      . cholecalciferol (VITAMIN  D) 400 units TABS tablet Take 400 Units by mouth daily.    Marland Kitchen docusate sodium (COLACE) 100 MG capsule Take 100 mg by mouth daily.    . fluticasone (FLONASE) 50 MCG/ACT nasal spray PLACE 2 SPRAYS INTO THE NOSE DAILY 48 g 1  . guaiFENesin (MUCINEX) 600 MG 12 hr tablet Take 600 mg by mouth.    . hydroxypropyl methylcellulose / hypromellose (ISOPTO TEARS / GONIOVISC) 2.5 % ophthalmic solution Place 1 drop into both eyes as needed for dry eyes.    . isosorbide mononitrate (IMDUR) 60 MG 24 hr tablet TAKE 1 TABLET (60 MG TOTAL) BY MOUTH DAILY. 90 tablet 2  . loratadine (CLARITIN) 10 MG tablet Take 10 mg by mouth daily.      . Melatonin-Pyridoxine (MELATIN) 3-1 MG TABS  Take 2 tablets by mouth at bedtime.      . metoprolol succinate (TOPROL-XL) 50 MG 24 hr tablet TAKE 1 TABLET (50 MG TOTAL) BY MOUTH DAILY. 90 tablet 2  . montelukast (SINGULAIR) 10 MG tablet TAKE 1 TABLET (10 MG TOTAL) BY MOUTH AT BEDTIME. 90 tablet 3  . Multiple Vitamin (MULTIVITAMIN) tablet Take 1 tablet by mouth 2 (two) times daily.     . Omega-3 Fatty Acids (FISH OIL) 1000 MG CAPS Take 2 capsules by mouth 2 (two) times daily.      Marland Kitchen omeprazole (PRILOSEC) 20 MG capsule TAKE 1 CAPSULE (20 MG TOTAL) BY MOUTH 2 (TWO) TIMES DAILY BEFORE A MEAL. 180 capsule 0  . potassium chloride (MICRO-K) 10 MEQ CR capsule TAKE 1 CAPSULE (10 MEQ TOTAL) BY MOUTH DAILY. 90 capsule 2  . vitamin C (ASCORBIC ACID) 500 MG tablet Take 1,000 mg by mouth 2 (two) times daily.       No facility-administered medications prior to visit.      Allergies:   Lunesta [eszopiclone]; Ultram [tramadol]; Lisinopril; Simvastatin; and Statins   Social History   Social History  . Marital status: Divorced    Spouse name: N/A  . Number of children: N/A  . Years of education: N/A   Occupational History  . retired Retired   Social History Main Topics  . Smoking status: Former Smoker    Packs/day: 2.00    Years: 20.00    Types: Cigarettes    Quit date: 10/25/1977  . Smokeless tobacco: Never Used     Comment: smoked 2ppd for 15 years; quit in 1979  . Alcohol use No  . Drug use: No  . Sexual activity: Not Asked   Other Topics Concern  . None   Social History Narrative   Divorced, 2 children; currrantly lives with her daughter and son - in - Sports [employer].      Family History:  The patient's family history includes Breast cancer in her sister; Colon cancer in her sister; Emphysema in her daughter; Heart disease in her brother and sister.   ROS:   Please see the history of present illness.   Denies any syncope, palpitations, racing heart rate, anginal symptoms ROS All other systems reviewed and are negative.   PHYSICAL EXAM:     VS:  BP (!) 100/54   Pulse 69   Ht 5\' 1"  (1.549 m)   SpO2 98%    GEN: Well nourished, well developed, in no acute distress  HEENT: normal Oxygen nasal cannula. Neck: no JVD, carotid bruits, or masses Cardiac: RRR; no murmurs, rubs, or gallops,no edema  Respiratory:  clear to auscultation bilaterally, normal work of breathing GI: soft, nontender, nondistended, +  BS MS: no deformity or atrophy  Skin: warm and dry, no rash Neuro:  Alert and Oriented x 3, Strength and sensation are intact Psych: euthymic mood, full affect   Wt Readings from Last 3 Encounters:  09/23/16 120 lb (54.4 kg)  09/02/16 121 lb 6.4 oz (55.1 kg)  06/09/16 132 lb 6.4 oz (60.1 kg)      Studies/Labs Reviewed:   EKG:  EKG today 11/15/16 shows sinus rhythm 69 with no other abnormalities personally viewed-prior Previously EKG in the hospital setting during hypoxia showed sinus tachycardia. In the setting of her demand ischemia, ST segment depression was noted previously.  Recent Labs: 04/22/2016: ALT 17 05/15/2016: B Natriuretic Peptide 129.2 05/16/2016: TSH 0.950 05/19/2016: Hemoglobin 10.9; Platelets 278 05/20/2016: BUN 24; Creatinine, Ser 0.80; Potassium 3.3; Sodium 131   Lipid Panel    Component Value Date/Time   CHOL 333 (H) 08/15/2013 1138   CHOL 307 (H) 08/21/2012 1427   TRIG 137 08/15/2013 1138   TRIG 142 08/21/2012 1427   HDL 75 08/15/2013 1138   HDL 80 08/21/2012 1427   CHOLHDL 7 02/26/2009 0944   VLDL 20.6 02/26/2009 0944   LDLCALC 231 (H) 08/15/2013 1138   LDLCALC 199 (H) 08/21/2012 1427   LDLDIRECT 238.0 02/26/2009 0944    Additional studies/ records that were reviewed today include:  Prior lab work, hospital records, EKG reviewed    ASSESSMENT:    1. Pre-operative cardiovascular examination   2. CORONARY ARTERY BYPASS GRAFT, THREE VESSEL, HX OF   3. Atherosclerosis of native coronary artery of native heart without angina pectoris   4. Shortness of breath   5. Hyponatremia       PLAN:  In order of problems listed above:  Preoperative risk stratification for orthopedic surgery  - With her prior coronary disease post CABG and non-ST elevation, type II MI with very low level troponin in January, she is increased risk for surgery. She is not able to complete greater than 4 METS of activity in her current situation. She does get up and go to the bathroom without any anginal symptoms however. I think given her prior episode in January of mild troponin elevation and ST segment depression and her upcoming surgery with general anesthesia, we should check a nuclear stress test, pharmacologic for further risk stratification. Obviously if it is high risk, we may need to postpone knee replacement, left side. Her daughter is frustrated that this will lead to another appointment but she understands  - Continue with aspirin.  - If she must, she may hold her Plavix 5 days prior to surgery to help prevent bleeding. Let us see what the stress test shows.  Type II non-ST elevation myocardial infarction   - She was treated medically in the hospital because of low level troponin elevation likely secondary to bronchitis, however ECG did show some ST segment depression.  - Continue with antianginal medication such as isosorbide, Toprol, amlodipine.  - Her left shoulder is improved.  - Plavix was added at the time of hospitalization.  Dyspnea  - Previously did not want any further extensive evaluation. She was following up with both family medicine as well as pulmonary medicine.  - This seems to be at baseline, in fact she feels improved. She still wears oxygen. Occasionally her saturation will be 91% without oxygen.  Carotid stenosis  - previously moderate bilateral. Continue current plan. Secondary prevention.  Hyponatremia  - She was as low as 119 on admission. SIADH. Sodium 131 on discharge.  Celexa, Maxide discontinued. Fluid restrictions. Continue to monitor    Medication  Adjustments/Labs and Tests Ordered: Current medicines are reviewed at length with the patient today.  Concerns regarding medicines are outlined above.  Medication changes, Labs and Tests ordered today are listed in the Patient Instructions below. Patient Instructions  Medication Instructions:  Your physician recommends that you continue on your current medications as directed. Please refer to the Current Medication list given to you today.   Labwork: None   Testing/Procedures: Your physician has requested that you have a lexiscan myoview. For further information please visit [email or web address]. Please follow instruction sheet, as given.    Follow-Up: As needed  Any Other Special Instructions Will Be Listed Below (If Applicable).     If you need a refill on your cardiac medications before your next appointment, please call your pharmacy.      Signed, Candee Furbish, MD  11/15/2016 10:58 AM    [facility] [address], [place], [place]  [address] Phone: [phone]; Fax: [phone]

## 2016-11-15 NOTE — Patient Instructions (Addendum)
Medication Instructions:  Your physician recommends that you continue on your current medications as directed. Please refer to the Current Medication list given to you today.   Labwork: None   Testing/Procedures: Your physician has requested that you have a lexiscan myoview. For further information please visit [email or web address]. Please follow instruction sheet, as given.    Follow-Up: As needed  Any Other Special Instructions Will Be Listed Below (If Applicable).     If you need a refill on your cardiac medications before your next appointment, please call your pharmacy.

## 2016-11-16 ENCOUNTER — Telehealth: Payer: Self-pay | Admitting: Internal Medicine

## 2016-11-16 ENCOUNTER — Ambulatory Visit: Payer: Medicare Other | Admitting: Cardiology

## 2016-11-16 NOTE — Telephone Encounter (Signed)
She has IPF and is on o3. She I s84. All these increase risk but certainly not prohibitbe. But there is concern that anesthesia can increase IPF flare odds. Best she comes in and does formal pre-op eval wit the APPs - Tammy or Sarah - they have preop scoring calcuilator and they can advise  Dr. Brand Males, M.D., [facility]C.P Pulmonary and Critical Care Medicine Staff Physician [facility] Pager: [phone], If no answer or between  15:00h - 7:00h: call [phone]  11/16/2016 5:28 PM

## 2016-11-16 NOTE — Telephone Encounter (Signed)
Spoke with Judeen Hammans from [facility] who states she is faxing over today a Surgical Clearance for this pt. She states pt is scheduled for Left knee Replacement on August the 7. She wanted to know if MR would be willing to clear her for surgery based on patient's last ov which was in June 2018. Awaiting fax from her.   Will forward to [place] to assure MR receives form

## 2016-11-16 NOTE — Telephone Encounter (Signed)
ATC to call Judeen Hammans but the office was closed. Will attempt tomorrow.

## 2016-11-17 ENCOUNTER — Ambulatory Visit (HOSPITAL_COMMUNITY): Payer: Medicare Other | Attending: Cardiology

## 2016-11-17 DIAGNOSIS — R9439 Abnormal result of other cardiovascular function study: Secondary | ICD-10-CM | POA: Insufficient documentation

## 2016-11-17 DIAGNOSIS — Z01818 Encounter for other preprocedural examination: Secondary | ICD-10-CM | POA: Insufficient documentation

## 2016-11-17 DIAGNOSIS — Z951 Presence of aortocoronary bypass graft: Secondary | ICD-10-CM

## 2016-11-17 DIAGNOSIS — I10 Essential (primary) hypertension: Secondary | ICD-10-CM | POA: Diagnosis not present

## 2016-11-17 DIAGNOSIS — I2510 Atherosclerotic heart disease of native coronary artery without angina pectoris: Secondary | ICD-10-CM | POA: Insufficient documentation

## 2016-11-17 DIAGNOSIS — R0602 Shortness of breath: Secondary | ICD-10-CM | POA: Diagnosis not present

## 2016-11-17 LAB — MYOCARDIAL PERFUSION IMAGING
CHL CUP NUCLEAR SDS: 10
CHL CUP NUCLEAR SRS: 5
CHL CUP NUCLEAR SSS: 15
LHR: 0.29
LV sys vol: 35 mL
LVDIAVOL: 83 mL (ref 46–106)
NUC STRESS TID: 0.97
Peak HR: 88 {beats}/min
Rest HR: 61 {beats}/min

## 2016-11-17 MED ORDER — TECHNETIUM TC 99M TETROFOSMIN IV KIT
32.8000 | PACK | Freq: Once | INTRAVENOUS | Status: AC | PRN
  Administered 2016-11-17: 32.8 via INTRAVENOUS
  Filled 2016-11-17: qty 33

## 2016-11-17 MED ORDER — REGADENOSON 0.4 MG/5ML IV SOLN
0.4000 mg | Freq: Once | INTRAVENOUS | Status: AC
  Administered 2016-11-17: 0.4 mg via INTRAVENOUS

## 2016-11-17 MED ORDER — TECHNETIUM TC 99M TETROFOSMIN IV KIT
9.7000 | PACK | Freq: Once | INTRAVENOUS | Status: AC | PRN
  Administered 2016-11-17: 9.7 via INTRAVENOUS
  Filled 2016-11-17: qty 10

## 2016-11-17 NOTE — H&P (Signed)
TOTAL KNEE ADMISSION H&P  Patient is being admitted for left total knee arthroplasty.  Subjective:  Chief Complaint:   Left knee primary OA / pain  HPI: Joscelynn Theadora Rama, 83 y.o. female, has a history of pain and functional disability in the left knee due to arthritis and has failed non-surgical conservative treatments for greater than 12 weeks to includeNSAID's and/or analgesics, corticosteriod injections, use of assistive devices and activity modification.  Onset of symptoms was gradual, starting <1 year ago with rapidlly worsening course since that time. The patient noted no past surgery on the left knee(s).  Patient currently rates pain in the left knee(s) at 9 out of 10 with activity. Patient has worsening of pain with activity and weight bearing, pain that interferes with activities of daily living, pain with passive range of motion, crepitus and joint swelling.  Patient has evidence of periarticular osteophytes and joint space narrowing by imaging studies.  There is no active infection.  Risks, benefits and expectations were discussed with the patient.  Risks including but not limited to the risk of anesthesia, blood clots, nerve damage, blood vessel damage, failure of the prosthesis, infection and up to and including death.  Patient understand the risks, benefits and expectations and wishes to proceed with surgery.   PCP: Vernie Shanks, MD  D/C Plans:       SNF - [facility] (for 5 days)  Post-op Meds:       No Rx given   Tranexamic Acid:      To be given - IV   Decadron:      Is to be given  FYI:     ASA  Norco  Usually on O2 @ 2L  DME:    Rx given for - RW and 3-n-1  PT:   OPPT after SNF   Patient Active Problem List   Diagnosis Date Noted  . Pulmonary emphysema ([place]) 09/23/2016  . History of influenza 09/02/2016  . Edema of left lower extremity 09/02/2016  . IPF (idiopathic pulmonary fibrosis) ([place]) 09/02/2016  . Family history of COPD (chronic obstructive pulmonary  disease) 09/02/2016  . COPD exacerbation ([place])   . Hyponatremia 05/15/2016  . Acute on chronic respiratory failure with hypoxemia ([place]) 05/15/2016  . Acute respiratory failure with hypoxia ([place]) 05/15/2016  . Overweight (BMI 25.0-29.9) 02/11/2016  . Depression 01/08/2014  . Asthma, chronic 01/08/2014  . GERD (gastroesophageal reflux disease) 01/08/2014  . Chronic pain syndrome 05/17/2013  . Insomnia 05/17/2013  . Elevated serum creatinine 11/22/2012  . CAROTID BRUIT 12/31/2008  . Hyperlipidemia 07/09/2008  . Essential hypertension, benign 07/09/2008  . Coronary atherosclerosis 07/09/2008  . CORONARY ARTERY BYPASS GRAFT, THREE VESSEL, HX OF 06/29/1994   Past Medical History:  Diagnosis Date  . Asthma   . CAD (coronary artery disease)   . Carotid stenosis    40-59% bilateral  . Chronic pain syndrome   . GERD (gastroesophageal reflux disease)   . HLD (hyperlipidemia)   . HTN (hypertension)   . Skin cancer     Past Surgical History:  Procedure Laterality Date  . ABDOMINAL HYSTERECTOMY  1974  . baldder resuspension  1988  . CORONARY ARTERY BYPASS GRAFT  1996   LIMA to LAD, SVG to PDA and SVG to D1   . CORONARY ARTERY BYPASS GRAFT  2010   cath 2 vessel CAD with 2 of 3 bypass grafts occluded. had a patent LIMA to LAD  . skin cancer resected    . TONSILLECTOMY  No prescriptions prior to admission.   Allergies  Allergen Reactions  . Lunesta [Eszopiclone]     Didn't work  . Ultram [Tramadol] Itching  . Lisinopril   . Simvastatin   . Statins     (Causes weakness in legs and decreases ability to walk.  (lipitor crestor)    Social History  Substance Use Topics  . Smoking status: Former Smoker    Packs/day: 2.00    Years: 20.00    Types: Cigarettes    Quit date: 10/25/1977  . Smokeless tobacco: Never Used     Comment: smoked 2ppd for 15 years; quit in 1979  . Alcohol use No    Family History  Problem Relation Age of Onset  . Heart disease Brother         multiple; ealry onset  . Heart disease Sister        multiple; early onset   . Breast cancer Sister   . Colon cancer Sister   . Emphysema Daughter      Review of Systems  Constitutional: Negative.   HENT: Negative.   Eyes: Negative.   Respiratory: Positive for shortness of breath (with exertion).   Cardiovascular: Negative.   Gastrointestinal: Positive for heartburn.  Genitourinary: Negative.   Musculoskeletal: Positive for joint pain.  Skin: Negative.   Neurological: Negative.   Endo/Heme/Allergies: Negative.   Psychiatric/Behavioral: Positive for depression. The patient has insomnia.     Objective:  Physical Exam  Constitutional: She is oriented to person, place, and time. She appears well-developed.  HENT:  Head: Normocephalic.  Eyes: Pupils are equal, round, and reactive to light.  Neck: Neck supple. No JVD present. No tracheal deviation present. No thyromegaly present.  Cardiovascular: Normal rate, regular rhythm and intact distal pulses.   Respiratory: Effort normal and breath sounds normal. No respiratory distress. She has no wheezes.  GI: Soft. There is no tenderness. There is no guarding.  Musculoskeletal:       Left knee: She exhibits decreased range of motion, swelling and bony tenderness. She exhibits no ecchymosis, no deformity, no laceration and no erythema. Tenderness found.  Lymphadenopathy:    She has no cervical adenopathy.  Neurological: She is alert and oriented to person, place, and time.  Skin: Skin is warm and dry.  Psychiatric: She has a normal mood and affect.      Labs:  Estimated body mass index is 22.67 kg/m as calculated from the following:   Height as of 09/23/16: 5\' 1"  (1.549 m).   Weight as of 09/23/16: 54.4 kg (120 lb).   Imaging Review Plain radiographs demonstrate severe degenerative joint disease of the left knee(s). The bone quality appears to be good for age and reported activity level.  Assessment/Plan:  End stage arthritis,  left knee   The patient history, physical examination, clinical judgment of the provider and imaging studies are consistent with end stage degenerative joint disease of the left knee(s) and total knee arthroplasty is deemed medically necessary. The treatment options including medical management, injection therapy arthroscopy and arthroplasty were discussed at length. The risks and benefits of total knee arthroplasty were presented and reviewed. The risks due to aseptic loosening, infection, stiffness, patella tracking problems, thromboembolic complications and other imponderables were discussed. The patient acknowledged the explanation, agreed to proceed with the plan and consent was signed. Patient is being admitted for inpatient treatment for surgery, pain control, PT, OT, prophylactic antibiotics, VTE prophylaxis, progressive ambulation and ADL's and discharge planning. The patient is planning to  be discharged to skilled nursing facility.    [place]   PA-C  11/17/2016, 12:55 PM

## 2016-11-18 NOTE — Telephone Encounter (Signed)
ATC sherry- received recording stating office is closed until 11/21/16. Will call back on 11/21/16

## 2016-11-21 ENCOUNTER — Other Ambulatory Visit (HOSPITAL_COMMUNITY): Payer: Self-pay | Admitting: Emergency Medicine

## 2016-11-21 NOTE — Telephone Encounter (Signed)
LMOM TCB x1 for [employer]

## 2016-11-21 NOTE — Progress Notes (Signed)
[facility] cardiology Dr Marlou Porch 11-15-16 epic  Stress Test 11-17-16  Epic ; per Dr Marlou Porch "Findings are consistent with prior cardiac cath evaluation (occluded SVG to Diagonal graft). Given her normal EF and equivocal changes in inferior wall, she may proceed with knee surgery with moderate overall cardiovascular risk. Please fax to Dr. Alvan Dame. Thanks. "  EKG 11-15-16 epic  [facility] pulmonology Dr Chase Caller 09-23-16 epic

## 2016-11-21 NOTE — Patient Instructions (Signed)
Mahaley TANNISHA KENNINGTON  11/21/2016   Your procedure is scheduled on: 11-29-16  Report to [facility] Main  Entrance   Report to admitting at 11:50AM   Call this number if you have problems the morning of surgery [phone]   Remember: ONLY 1 PERSON MAY GO WITH YOU TO SHORT STAY TO GET  READY MORNING OF YOUR SURGERY.  Do not eat food After Midnight. youmay have clear liquids from midnight until 820am day of surgery. Nothing by mouth after 820am!!     Take these medicines the morning of surgery with A SIP OF WATER: amlodipine(norvasc), imdur, metoprolol, omeprazole, loratadine(claritin), inhaleras needed (may bring to hospital), nasal spray, eye drops, mucinex as needed, tylenol as needed                                You may not have any metal on your body including hair pins and              piercings  Do not wear jewelry, make-up, lotions, powders or perfumes, deodorant             Do not wear nail polish.  Do not shave  48 hours prior to surgery.     Do not bring valuables to the hospital. [place].  Contacts, dentures or bridgework may not be worn into surgery.  Leave suitcase in the car. After surgery it may be brought to your room.                Please read over the following fact sheets you were given: _____________________________________________________________________   CLEAR LIQUID DIET   Foods Allowed                                                                     Foods Excluded  Coffee and tea, regular and decaf                             liquids that you cannot  Plain Jell-O in any flavor                                             see through such as: Fruit ices (not with fruit pulp)                                     milk, soups, orange juice  Iced Popsicles                                    All solid food Carbonated beverages, regular and diet  Cranberry, grape and apple juices Sports drinks like Gatorade Lightly seasoned clear broth or consume(fat free) Sugar, honey syrup  Sample Menu Breakfast                                Lunch                                     Supper Cranberry juice                    Beef broth                            Chicken broth Jell-O                                     Grape juice                           Apple juice Coffee or tea                        Jell-O                                      Popsicle                                                Coffee or tea                        Coffee or tea  _____________________________________________________________________  [facility] - Preparing for Surgery Before surgery, you can play an important role.  Because skin is not sterile, your skin needs to be as free of germs as possible.  You can reduce the number of germs on your skin by washing with CHG (chlorahexidine gluconate) soap before surgery.  CHG is an antiseptic cleaner which kills germs and bonds with the skin to continue killing germs even after washing. Please DO NOT use if you have an allergy to CHG or antibacterial soaps.  If your skin becomes reddened/irritated stop using the CHG and inform your nurse when you arrive at Short Stay. Do not shave (including legs and underarms) for at least 48 hours prior to the first CHG shower.  You may shave your face/neck. Please follow these instructions carefully:  1.  Shower with CHG Soap the night before surgery and the  morning of Surgery.  2.  If you choose to wash your hair, wash your hair first as usual with your  normal  shampoo.  3.  After you shampoo, rinse your hair and body thoroughly to remove the  shampoo.                           4.  Use CHG as you would any other liquid soap.  You can apply chg directly  to the skin and wash  Gently with a scrungie or clean washcloth.  5.  Apply the CHG Soap to your body ONLY  FROM THE NECK DOWN.   Do not use on face/ open                           Wound or open sores. Avoid contact with eyes, ears mouth and genitals (private parts).                       Wash face,  Genitals (private parts) with your normal soap.             6.  Wash thoroughly, paying special attention to the area where your surgery  will be performed.  7.  Thoroughly rinse your body with warm water from the neck down.  8.  DO NOT shower/wash with your normal soap after using and rinsing off  the CHG Soap.                9.  Pat yourself dry with a clean towel.            10.  Wear clean pajamas.            11.  Place clean sheets on your bed the night of your first shower and do not  sleep with pets. Day of Surgery : Do not apply any lotions/deodorants the morning of surgery.  Please wear clean clothes to the hospital/surgery center.  FAILURE TO FOLLOW THESE INSTRUCTIONS MAY RESULT IN THE CANCELLATION OF YOUR SURGERY PATIENT SIGNATURE_________________________________  NURSE SIGNATURE__________________________________  ________________________________________________________________________   Adam Phenix  An incentive spirometer is a tool that can help keep your lungs clear and active. This tool measures how well you are filling your lungs with each breath. Taking long deep breaths may help reverse or decrease the chance of developing breathing (pulmonary) problems (especially infection) following:  A long period of time when you are unable to move or be active. BEFORE THE PROCEDURE   If the spirometer includes an indicator to show your best effort, your nurse or respiratory therapist will set it to a desired goal.  If possible, sit up straight or lean slightly forward. Try not to slouch.  Hold the incentive spirometer in an upright position. INSTRUCTIONS FOR USE  1. Sit on the edge of your bed if possible, or sit up as far as you can in bed or on a chair. 2. Hold the incentive  spirometer in an upright position. 3. Breathe out normally. 4. Place the mouthpiece in your mouth and seal your lips tightly around it. 5. Breathe in slowly and as deeply as possible, raising the piston or the ball toward the top of the column. 6. Hold your breath for 3-5 seconds or for as long as possible. Allow the piston or ball to fall to the bottom of the column. 7. Remove the mouthpiece from your mouth and breathe out normally. 8. Rest for a few seconds and repeat Steps 1 through 7 at least 10 times every 1-2 hours when you are awake. Take your time and take a few normal breaths between deep breaths. 9. The spirometer may include an indicator to show your best effort. Use the indicator as a goal to work toward during each repetition. 10. After each set of 10 deep breaths, practice coughing to be sure your lungs are clear. If you have an incision (the cut made at the time of  surgery), support your incision when coughing by placing a pillow or rolled up towels firmly against it. Once you are able to get out of bed, walk around indoors and cough well. You may stop using the incentive spirometer when instructed by your caregiver.  RISKS AND COMPLICATIONS  Take your time so you do not get dizzy or light-headed.  If you are in pain, you may need to take or ask for pain medication before doing incentive spirometry. It is harder to take a deep breath if you are having pain. AFTER USE  Rest and breathe slowly and easily.  It can be helpful to keep track of a log of your progress. Your caregiver can provide you with a simple table to help with this. If you are using the spirometer at home, follow these instructions: [place] IF:   You are having difficultly using the spirometer.  You have trouble using the spirometer as often as instructed.  Your pain medication is not giving enough relief while using the spirometer.  You develop fever of 100.5 F (38.1 C) or higher. SEEK  IMMEDIATE MEDICAL CARE IF:   You cough up bloody sputum that had not been present before.  You develop fever of 102 F (38.9 C) or greater.  You develop worsening pain at or near the incision site. MAKE SURE YOU:   Understand these instructions.  Will watch your condition.  Will get help right away if you are not doing well or get worse. Document Released: 08/22/2006 Document Revised: 07/04/2011 Document Reviewed: 10/23/2006 Exit[employer] Patient Information 2014 [employer], [place].   ________________________________________________________________________  WHAT IS A BLOOD TRANSFUSION? Blood Transfusion Information  A transfusion is the replacement of blood or some of its parts. Blood is made up of multiple cells which provide different functions.  Red blood cells carry oxygen and are used for blood loss replacement.  White blood cells fight against infection.  Platelets control bleeding.  Plasma helps clot blood.  Other blood products are available for specialized needs, such as hemophilia or other clotting disorders. BEFORE THE TRANSFUSION  Who gives blood for transfusions?   Healthy volunteers who are fully evaluated to make sure their blood is safe. This is blood bank blood. Transfusion therapy is the safest it has ever been in the practice of medicine. Before blood is taken from a donor, a complete history is taken to make sure that person has no history of diseases nor engages in risky social behavior (examples are intravenous drug use or sexual activity with multiple partners). The donor's travel history is screened to minimize risk of transmitting infections, such as malaria. The donated blood is tested for signs of infectious diseases, such as HIV and hepatitis. The blood is then tested to be sure it is compatible with you in order to minimize the chance of a transfusion reaction. If you or a relative donates blood, this is often done in anticipation of surgery and is not  appropriate for emergency situations. It takes many days to process the donated blood. RISKS AND COMPLICATIONS Although transfusion therapy is very safe and saves many lives, the main dangers of transfusion include:   Getting an infectious disease.  Developing a transfusion reaction. This is an allergic reaction to something in the blood you were given. Every precaution is taken to prevent this. The decision to have a blood transfusion has been considered carefully by your caregiver before blood is given. Blood is not given unless the benefits outweigh the risks. AFTER THE  TRANSFUSION  Right after receiving a blood transfusion, you will usually feel much better and more energetic. This is especially true if your red blood cells have gotten low (anemic). The transfusion raises the level of the red blood cells which carry oxygen, and this usually causes an energy increase.  The nurse administering the transfusion will monitor you carefully for complications. HOME CARE INSTRUCTIONS  No special instructions are needed after a transfusion. You may find your energy is better. Speak with your caregiver about any limitations on activity for underlying diseases you may have. SEEK MEDICAL CARE IF:   Your condition is not improving after your transfusion.  You develop redness or irritation at the intravenous (IV) site. SEEK IMMEDIATE MEDICAL CARE IF:  Any of the following symptoms occur over the next 12 hours:  Shaking chills.  You have a temperature by mouth above 102 F (38.9 C), not controlled by medicine.  Chest, back, or muscle pain.  People around you feel you are not acting correctly or are confused.  Shortness of breath or difficulty breathing.  Dizziness and fainting.  You get a rash or develop hives.  You have a decrease in urine output.  Your urine turns a dark color or changes to pink, red, or brown. Any of the following symptoms occur over the next 10 days:  You have a  temperature by mouth above 102 F (38.9 C), not controlled by medicine.  Shortness of breath.  Weakness after normal activity.  The white part of the eye turns yellow (jaundice).  You have a decrease in the amount of urine or are urinating less often.  Your urine turns a dark color or changes to pink, red, or brown. Document Released: 04/08/2000 Document Revised: 07/04/2011 Document Reviewed: 11/26/2007 [facility] Patient Information 2014 [place], [place].  _______________________________________________________________________

## 2016-11-21 NOTE — Telephone Encounter (Signed)
Judeen Hammans returned call Angelica Chessman of MR's response/recs regarding pt's surgical clearance and need for appt.   Sherry requests a call back with appt information  VS opened office this week  Called spoke with patient's daughter Daine Floras to discuss appt >> she unable to bring pt until Wednesday >> appt scheduled with VS for 8.1.18 @ 0900  Judeen Hammans is aware of the appt date/time Will sign and route to both MR and VS as FYI

## 2016-11-22 ENCOUNTER — Encounter (HOSPITAL_COMMUNITY)
Admission: RE | Admit: 2016-11-22 | Discharge: 2016-11-22 | Disposition: A | Discharge status: Home / Self Care | Payer: Medicare Other | Source: Ambulatory Visit | Attending: Orthopedic Surgery | Admitting: Orthopedic Surgery

## 2016-11-22 ENCOUNTER — Encounter (HOSPITAL_COMMUNITY): Payer: Self-pay

## 2016-11-22 DIAGNOSIS — Z01818 Encounter for other preprocedural examination: Secondary | ICD-10-CM | POA: Insufficient documentation

## 2016-11-22 DIAGNOSIS — M1712 Unilateral primary osteoarthritis, left knee: Secondary | ICD-10-CM | POA: Diagnosis not present

## 2016-11-22 HISTORY — DX: Other specified postprocedural states: R11.2

## 2016-11-22 HISTORY — DX: Dysuria: R30.0

## 2016-11-22 HISTORY — DX: Other specified postprocedural states: Z98.890

## 2016-11-22 LAB — BASIC METABOLIC PANEL
ANION GAP: 9 (ref 5–15)
BUN: 37 mg/dL — AB (ref 6–20)
CHLORIDE: 106 mmol/L (ref 101–111)
CO2: 25 mmol/L (ref 22–32)
Calcium: 9.3 mg/dL (ref 8.9–10.3)
Creatinine, Ser: 0.79 mg/dL (ref 0.44–1.00)
GFR calc Af Amer: >60 mL/min (ref >60)
GFR calc non Af Amer: >60 mL/min (ref >60)
GLUCOSE: 103 mg/dL — AB (ref 65–99)
POTASSIUM: 4.7 mmol/L (ref 3.5–5.1)
Sodium: 140 mmol/L (ref 135–145)

## 2016-11-22 LAB — CBC
HEMATOCRIT: 35.4 % — AB (ref 36.0–46.0)
Hemoglobin: 11.8 g/dL — ABNORMAL LOW (ref 12.0–15.0)
MCH: 30 pg (ref 26.0–34.0)
MCHC: 33.3 g/dL (ref 30.0–36.0)
MCV: 90.1 fL (ref 78.0–100.0)
Platelets: 243 10*3/uL (ref 150–400)
RBC: 3.93 MIL/uL (ref 3.87–5.11)
RDW: 13.2 % (ref 11.5–15.5)
WBC: 8.9 10*3/uL (ref 4.0–10.5)

## 2016-11-22 LAB — SURGICAL PCR SCREEN
MRSA, PCR: NEGATIVE
STAPHYLOCOCCUS AUREUS: NEGATIVE

## 2016-11-22 LAB — ABO/RH: ABO/RH(D): O POS

## 2016-11-22 NOTE — Progress Notes (Signed)
Bmp result routed via epic to Dr Alvan Dame

## 2016-11-23 ENCOUNTER — Encounter: Payer: Self-pay | Admitting: Pulmonary Disease

## 2016-11-23 ENCOUNTER — Ambulatory Visit (INDEPENDENT_AMBULATORY_CARE_PROVIDER_SITE_OTHER): Payer: Medicare Other | Admitting: Pulmonary Disease

## 2016-11-23 VITALS — BP 116/74 | HR 71 | Ht 61.0 in | Wt 115.0 lb

## 2016-11-23 DIAGNOSIS — J438 Other emphysema: Secondary | ICD-10-CM

## 2016-11-23 DIAGNOSIS — I6523 Occlusion and stenosis of bilateral carotid arteries: Secondary | ICD-10-CM

## 2016-11-23 DIAGNOSIS — Z01811 Encounter for preprocedural respiratory examination: Secondary | ICD-10-CM | POA: Diagnosis not present

## 2016-11-23 DIAGNOSIS — J8410 Pulmonary fibrosis, unspecified: Secondary | ICD-10-CM | POA: Diagnosis not present

## 2016-11-23 NOTE — Patient Instructions (Signed)
Okay to proceed with surgery with Dr. Alvan Dame  We will reschedule your follow up with Dr. Chase Caller to 3 months

## 2016-11-23 NOTE — Progress Notes (Signed)
Chief Complaint  Patient presents with  . Follow-up    MR patient: Pt is here for surgical clearance -- Left knee surgery scheduled for August 7th, 2018.  Uses O2 24/7 @ 2 liters.     History of Present Illness [name] is a 83 y.o. female with pulmonary fibrosis and emphysema.  She is followed by Dr. Chase Caller.  She injured her left knee a few months ago.  She has been working with Dr. Alvan Dame and has surgery set up for next week (Lt total knee arthroplasty).  She was hospitalized in January 2018 with influenza.  She was started on supplemental oxygen after this hospitalization.  She doesn't think she needs oxygen now.  She has portable pulse oximeter, and her oxygen never goes below 90% even when she is off oxygen.  She stopped using Breo.  She didn't feel like it was helping.  She is using singulair, but isn't sure if this helps.  She has albuterol, but can't remember the last time she felt like she needed to use this.  Before her knee injury she was fairly active.  She doesn't feel like her breathing limits her activities.  She is not having cough, wheeze, sputum, chest pain, fever, or hemoptysis.  Labs from 09/02/16 showed mild elevation in rheumatoid factor.  Otherwise negative.   She  has a past medical history of Asthma; CAD (coronary artery disease); Carotid stenosis; Chronic pain syndrome; Dysuria; GERD (gastroesophageal reflux disease); HLD (hyperlipidemia); HTN (hypertension); PONV (postoperative nausea and vomiting); and Skin cancer.   She  has a past surgical history that includes Abdominal hysterectomy (1974); baldder resuspension (1988); Tonsillectomy; skin cancer resected; Cardiac catheterization; Coronary artery bypass graft (1996); and Coronary artery bypass graft (2010).   Her family history includes Breast cancer in her sister; Colon cancer in her sister; Emphysema in her daughter; Heart disease in her brother and sister.   She  reports that she quit smoking  about 39 years ago. Her smoking use included Cigarettes. She has a 40.00 pack-year smoking history. She has never used smokeless tobacco. She reports that she does not drink alcohol or use drugs.   Allergies  Allergen Reactions  . Lunesta [Eszopiclone] Other (See Comments)    Didn't work  . Ultram [Tramadol] Itching  . Lisinopril Other (See Comments)    leg weakness  . Simvastatin Other (See Comments)    Pain and weakness in legs  . Statins Other (See Comments)    (Causes weakness in legs and decreases ability to walk.  (lipitor crestor)    Current Outpatient Prescriptions on File Prior to Visit  Medication Sig  . Acetaminophen (TYLENOL PO) Take 1 tablet by mouth daily.  Marland Kitchen albuterol (PROVENTIL HFA;VENTOLIN HFA) 108 (90 Base) MCG/ACT inhaler Inhale 2 puffs into the lungs every 6 (six) hours as needed for wheezing or shortness of breath.  Marland Kitchen amLODipine (NORVASC) 10 MG tablet Take 1 tablet (10 mg total) by mouth daily.  . Ascorbic Acid (VITAMIN C) 1000 MG tablet Take 1,000 mg by mouth 2 (two) times daily.  Marland Kitchen aspirin EC 81 MG tablet Take 81 mg by mouth daily.  . Calcium Carbonate-Vitamin D (CALCIUM 600/VITAMIN D) 600-400 MG-UNIT per tablet Take 1 tablet by mouth 2 (two) times daily.    . cholecalciferol (VITAMIN D) 400 units TABS tablet Take 400 Units by mouth daily.  Marland Kitchen doxylamine, Sleep, (UNISOM) 25 MG tablet Take 25 mg by mouth at bedtime. [place] brand from [employer]  . fluticasone (FLONASE) 50  MCG/ACT nasal spray PLACE 2 SPRAYS INTO THE NOSE DAILY (Patient taking differently: PLACE 1 SPRAYS INTO THE NOSE DAILY)  . guaiFENesin (MUCINEX) 600 MG 12 hr tablet Take 600 mg by mouth 2 (two) times daily.   . hydroxypropyl methylcellulose / hypromellose (ISOPTO TEARS / GONIOVISC) 2.5 % ophthalmic solution Place 1 drop into both eyes as needed for dry eyes.  . isosorbide mononitrate (IMDUR) 60 MG 24 hr tablet TAKE 1 TABLET (60 MG TOTAL) BY MOUTH DAILY. (Patient taking differently: Take 60 mg by mouth  daily. )  . loratadine (CLARITIN) 10 MG tablet Take 10 mg by mouth daily.    . Melatonin-Pyridoxine (MELATIN) 3-1 MG TABS Take 1 tablet by mouth at bedtime.   . metoprolol succinate (TOPROL-XL) 50 MG 24 hr tablet TAKE 1 TABLET (50 MG TOTAL) BY MOUTH DAILY. (Patient taking differently: Take 50 mg by mouth daily. )  . montelukast (SINGULAIR) 10 MG tablet TAKE 1 TABLET (10 MG TOTAL) BY MOUTH AT BEDTIME. (Patient taking differently: Take 10 mg by mouth at bedtime. )  . Multiple Vitamin (MULTIVITAMIN) tablet Take 1 tablet by mouth 2 (two) times daily.   . naproxen sodium (ANAPROX) 220 MG tablet Take 220 mg by mouth at bedtime.  . Omega-3 Fatty Acids (FISH OIL) 1000 MG CAPS Take 2 capsules by mouth 2 (two) times daily.    Marland Kitchen omeprazole (PRILOSEC) 20 MG capsule TAKE 1 CAPSULE (20 MG TOTAL) BY MOUTH 2 (TWO) TIMES DAILY BEFORE A MEAL. (Patient taking differently: TAKE 2 CAPSULE (40 MG TOTAL) BY MOUTH DAILY BEFORE A MEAL.)  . potassium chloride (MICRO-K) 10 MEQ CR capsule TAKE 1 CAPSULE (10 MEQ TOTAL) BY MOUTH DAILY. (Patient taking differently: Take 10 mEq by mouth daily. )   No current facility-administered medications on file prior to visit.     Vital signs BP 116/74 (BP Location: Left Arm, Cuff Size: Normal)   Pulse 71   Ht 5\' 1"  (1.549 m)   Wt 115 lb (52.2 kg)   SpO2 98%   BMI 21.73 kg/m   Physical Exam  General - pleasant Eyes - pupils reactive ENT - no sinus tenderness, no oral exudate, no LAN Cardiac - regular, no murmur Chest - no wheeze, rales Abd - soft, non tender Ext - no edema Skin - no rashes Neuro - normal strength Psych - normal mood   CMP Latest Ref Rng & Units 11/22/2016 05/20/2016 05/19/2016  Glucose 65 - 99 mg/dL 103(H) 120(H) 132(H)  BUN 6 - 20 mg/dL 37(H) 24(H) 34(H)  Creatinine 0.44 - 1.00 mg/dL 0.79 0.80 0.97  Sodium 135 - 145 mmol/L 140 131(L) 128(L)  Potassium 3.5 - 5.1 mmol/L 4.7 3.3(L) 5.2(H)  Chloride 101 - 111 mmol/L 106 94(L) 93(L)  CO2 22 - 32 mmol/L 25  27 26   Calcium 8.9 - 10.3 mg/dL 9.3 9.1 9.3  Total Protein 6.0 - 8.5 g/dL - - -  Total Bilirubin 0.0 - 1.2 mg/dL - - -  Alkaline Phos 39 - 117 IU/L - - -  AST 0 - 40 IU/L - - -  ALT 0 - 32 IU/L - - -    CBC Latest Ref Rng & Units 11/22/2016 05/19/2016 05/18/2016  WBC 4.0 - 10.5 K/uL 8.9 7.1 8.0  Hemoglobin 12.0 - 15.0 g/dL 11.8(L) 10.9(L) 11.5(L)  Hematocrit 36.0 - 46.0 % 35.4(L) 31.5(L) 33.6(L)  Platelets 150 - 400 K/uL 243 278 283    Pulmonary function test 11/14/12 >> FEV1 1.56(95%), FEV1% 81, TLC 3.49 (75%), DLCO  55% Alpha 1 AT 09/02/16 >> 155, MM   High resolution CT chest 09/08/16 >> widespread septal thickening and subpleural reticulation, minimal peripheral GGO, mild traction BTX more in lower lobes, possible early honeycombing, air trapping, moderate centrilobular and paraseptal emphysema   Discussion She has history of emphysema and pulmonary fibrosis.  She has been using oxygen intermittently since her hospitalization in January 2018 for influenza.  She has no significant respiratory symptoms, and it is uncertain whether she even still needs supplemental oxygen at this time.  She had knee injury several months ago, and is scheduled for Lt total knee arthroplasty.  She can proceed with surgery.  Assessment/Plan  Preoperative respiratory examination. - she should have close monitoring of her oxygen in the post operative period - she can use nebulizer therapy as needed after surgery - pulmonary team can be available as needed after surgery  Pulmonary fibrosis. - minimal symptoms - monitor clinically  Emphysema. - minimal symptoms - prn albuterol  Allergic rhinitis. - prn flonase, claritin - she will d/w Dr. Chase Caller at next visit about whether she needs to continue singulair  Hypoxia after hospitalization for influenza in January 2018. - advised her to continue with home oxygen set up for now, and then reassess whether this can be discontinued after she recovers  from knee surgery   Patient Instructions  Okay to proceed with surgery with Dr. Alvan Dame  We will reschedule your follow up with Dr. Chase Caller to 3 months    Chesley Mires, MD [facility] Pulmonary/Critical Care/Sleep Pager:  [phone] 11/23/2016, 9:40 AM

## 2016-11-24 NOTE — Progress Notes (Signed)
LOV/ pulmonology clearance Dr Sylvan Cheese. 11-23-16 epic

## 2016-11-25 NOTE — Progress Notes (Signed)
Spoke with Darol Destine daughter patient ok to take melatonin and dokylamine sleep aid night before surgery.

## 2016-11-29 ENCOUNTER — Inpatient Hospital Stay (HOSPITAL_COMMUNITY)
Admission: RE | Admit: 2016-11-29 | Discharge: 2016-12-01 | DRG: 470 | Disposition: A | Discharge status: Skilled Nursing Facility | Payer: Medicare Other | Source: Ambulatory Visit | Attending: Orthopedic Surgery | Admitting: Orthopedic Surgery

## 2016-11-29 ENCOUNTER — Inpatient Hospital Stay (HOSPITAL_COMMUNITY): Payer: Medicare Other | Admitting: Anesthesiology

## 2016-11-29 ENCOUNTER — Encounter (HOSPITAL_COMMUNITY): Payer: Self-pay | Admitting: *Deleted

## 2016-11-29 ENCOUNTER — Encounter (HOSPITAL_COMMUNITY)
Admission: RE | Disposition: A | Discharge status: Skilled Nursing Facility | Payer: Self-pay | Source: Ambulatory Visit | Attending: Orthopedic Surgery

## 2016-11-29 DIAGNOSIS — I739 Peripheral vascular disease, unspecified: Secondary | ICD-10-CM | POA: Diagnosis present

## 2016-11-29 DIAGNOSIS — D649 Anemia, unspecified: Secondary | ICD-10-CM | POA: Diagnosis present

## 2016-11-29 DIAGNOSIS — I25810 Atherosclerosis of coronary artery bypass graft(s) without angina pectoris: Secondary | ICD-10-CM | POA: Diagnosis not present

## 2016-11-29 DIAGNOSIS — Z951 Presence of aortocoronary bypass graft: Secondary | ICD-10-CM | POA: Diagnosis not present

## 2016-11-29 DIAGNOSIS — K219 Gastro-esophageal reflux disease without esophagitis: Secondary | ICD-10-CM | POA: Diagnosis present

## 2016-11-29 DIAGNOSIS — M25762 Osteophyte, left knee: Secondary | ICD-10-CM | POA: Diagnosis present

## 2016-11-29 DIAGNOSIS — Z825 Family history of asthma and other chronic lower respiratory diseases: Secondary | ICD-10-CM

## 2016-11-29 DIAGNOSIS — Z8249 Family history of ischemic heart disease and other diseases of the circulatory system: Secondary | ICD-10-CM | POA: Diagnosis not present

## 2016-11-29 DIAGNOSIS — Z85828 Personal history of other malignant neoplasm of skin: Secondary | ICD-10-CM | POA: Diagnosis not present

## 2016-11-29 DIAGNOSIS — J449 Chronic obstructive pulmonary disease, unspecified: Secondary | ICD-10-CM | POA: Diagnosis present

## 2016-11-29 DIAGNOSIS — I2510 Atherosclerotic heart disease of native coronary artery without angina pectoris: Secondary | ICD-10-CM | POA: Diagnosis present

## 2016-11-29 DIAGNOSIS — J45909 Unspecified asthma, uncomplicated: Secondary | ICD-10-CM | POA: Diagnosis not present

## 2016-11-29 DIAGNOSIS — Z96659 Presence of unspecified artificial knee joint: Secondary | ICD-10-CM

## 2016-11-29 DIAGNOSIS — M1712 Unilateral primary osteoarthritis, left knee: Principal | ICD-10-CM | POA: Diagnosis present

## 2016-11-29 DIAGNOSIS — I6523 Occlusion and stenosis of bilateral carotid arteries: Secondary | ICD-10-CM | POA: Diagnosis not present

## 2016-11-29 DIAGNOSIS — Z87891 Personal history of nicotine dependence: Secondary | ICD-10-CM | POA: Diagnosis not present

## 2016-11-29 DIAGNOSIS — M1990 Unspecified osteoarthritis, unspecified site: Secondary | ICD-10-CM | POA: Diagnosis not present

## 2016-11-29 DIAGNOSIS — I10 Essential (primary) hypertension: Secondary | ICD-10-CM | POA: Diagnosis not present

## 2016-11-29 DIAGNOSIS — Z800 Family history of malignant neoplasm of digestive organs: Secondary | ICD-10-CM

## 2016-11-29 DIAGNOSIS — Z803 Family history of malignant neoplasm of breast: Secondary | ICD-10-CM | POA: Diagnosis not present

## 2016-11-29 DIAGNOSIS — I252 Old myocardial infarction: Secondary | ICD-10-CM | POA: Diagnosis not present

## 2016-11-29 DIAGNOSIS — G8918 Other acute postprocedural pain: Secondary | ICD-10-CM | POA: Diagnosis not present

## 2016-11-29 DIAGNOSIS — Z9981 Dependence on supplemental oxygen: Secondary | ICD-10-CM

## 2016-11-29 DIAGNOSIS — Z96652 Presence of left artificial knee joint: Secondary | ICD-10-CM | POA: Diagnosis not present

## 2016-11-29 DIAGNOSIS — E785 Hyperlipidemia, unspecified: Secondary | ICD-10-CM | POA: Diagnosis not present

## 2016-11-29 DIAGNOSIS — Z471 Aftercare following joint replacement surgery: Secondary | ICD-10-CM | POA: Diagnosis not present

## 2016-11-29 HISTORY — PX: TOTAL KNEE ARTHROPLASTY: SHX125

## 2016-11-29 LAB — TYPE AND SCREEN
ABO/RH(D): O POS
Antibody Screen: NEGATIVE

## 2016-11-29 SURGERY — ARTHROPLASTY, KNEE, TOTAL
Anesthesia: Spinal | Site: Knee | Laterality: Left

## 2016-11-29 MED ORDER — NON FORMULARY: 40.0000 mg | Freq: Every day | Status: DC

## 2016-11-29 MED ORDER — FERROUS SULFATE 325 (65 FE) MG PO TABS
325.0000 mg | ORAL_TABLET | Freq: Three times a day (TID) | ORAL | Status: DC
Start: 2016-11-30 — End: 2016-12-01
  Administered 2016-11-30 – 2016-12-01 (×4): 325 mg via ORAL
  Filled 2016-11-29 (×4): qty 1

## 2016-11-29 MED ORDER — AMLODIPINE BESYLATE 10 MG PO TABS
10.0000 mg | ORAL_TABLET | Freq: Every day | ORAL | Status: DC
  Administered 2016-11-30 – 2016-12-01 (×2): 10 mg via ORAL
  Filled 2016-11-29 (×2): qty 1

## 2016-11-29 MED ORDER — MENTHOL 3 MG MT LOZG: 1.0000 | LOZENGE | OROMUCOSAL | Status: DC | PRN

## 2016-11-29 MED ORDER — ALBUTEROL SULFATE HFA 108 (90 BASE) MCG/ACT IN AERS: 2.0000 | INHALATION_SPRAY | Freq: Four times a day (QID) | RESPIRATORY_TRACT | Status: DC | PRN

## 2016-11-29 MED ORDER — GUAIFENESIN ER 600 MG PO TB12
600.0000 mg | ORAL_TABLET | Freq: Two times a day (BID) | ORAL | Status: DC
  Administered 2016-11-30 – 2016-12-01 (×3): 600 mg via ORAL
  Filled 2016-11-29 (×4): qty 1

## 2016-11-29 MED ORDER — ALBUTEROL SULFATE (2.5 MG/3ML) 0.083% IN NEBU: 2.5000 mg | INHALATION_SOLUTION | Freq: Four times a day (QID) | RESPIRATORY_TRACT | Status: DC | PRN

## 2016-11-29 MED ORDER — ONDANSETRON HCL 4 MG/2ML IJ SOLN
INTRAMUSCULAR | Status: AC
  Filled 2016-11-29: qty 2

## 2016-11-29 MED ORDER — ALUM & MAG HYDROXIDE-SIMETH 200-200-20 MG/5ML PO SUSP: 15.0000 mL | ORAL | Status: DC | PRN

## 2016-11-29 MED ORDER — ACETAMINOPHEN 500 MG PO TABS
ORAL_TABLET | ORAL | Status: AC
  Filled 2016-11-29: qty 2

## 2016-11-29 MED ORDER — LACTATED RINGERS IV SOLN
INTRAVENOUS | Status: DC | PRN
  Administered 2016-11-29 (×2): via INTRAVENOUS

## 2016-11-29 MED ORDER — ONDANSETRON HCL 4 MG/2ML IJ SOLN: 4.0000 mg | Freq: Once | INTRAMUSCULAR | Status: DC | PRN

## 2016-11-29 MED ORDER — FENTANYL CITRATE (PF) 100 MCG/2ML IJ SOLN
INTRAMUSCULAR | Status: AC
  Filled 2016-11-29: qty 2

## 2016-11-29 MED ORDER — CHLORHEXIDINE GLUCONATE 4 % EX LIQD: 60.0000 mL | Freq: Once | CUTANEOUS | Status: DC

## 2016-11-29 MED ORDER — METHOCARBAMOL 1000 MG/10ML IJ SOLN
500.0000 mg | Freq: Four times a day (QID) | INTRAMUSCULAR | Status: DC | PRN
  Administered 2016-11-29: 500 mg via INTRAVENOUS
  Filled 2016-11-29: qty 550

## 2016-11-29 MED ORDER — PROPOFOL 10 MG/ML IV BOLUS
INTRAVENOUS | Status: AC
  Filled 2016-11-29: qty 20

## 2016-11-29 MED ORDER — MONTELUKAST SODIUM 10 MG PO TABS
10.0000 mg | ORAL_TABLET | Freq: Every day | ORAL | Status: DC
  Administered 2016-11-29 – 2016-11-30 (×2): 10 mg via ORAL
  Filled 2016-11-29 (×2): qty 1

## 2016-11-29 MED ORDER — DEXAMETHASONE SODIUM PHOSPHATE 10 MG/ML IJ SOLN
10.0000 mg | Freq: Once | INTRAMUSCULAR | Status: AC
  Administered 2016-11-30: 10 mg via INTRAVENOUS
  Filled 2016-11-29: qty 1

## 2016-11-29 MED ORDER — KETOROLAC TROMETHAMINE 30 MG/ML IJ SOLN
INTRAMUSCULAR | Status: DC | PRN
  Administered 2016-11-29: 10 mg

## 2016-11-29 MED ORDER — CEFAZOLIN SODIUM-DEXTROSE 2-4 GM/100ML-% IV SOLN
2.0000 g | INTRAVENOUS | Status: AC
  Administered 2016-11-29: 2 g via INTRAVENOUS

## 2016-11-29 MED ORDER — ONDANSETRON HCL 4 MG/2ML IJ SOLN
4.0000 mg | Freq: Four times a day (QID) | INTRAMUSCULAR | Status: DC | PRN
  Administered 2016-11-29: 4 mg via INTRAVENOUS
  Filled 2016-11-29: qty 2

## 2016-11-29 MED ORDER — TRANEXAMIC ACID 1000 MG/10ML IV SOLN
1000.0000 mg | Freq: Once | INTRAVENOUS | Status: AC
  Administered 2016-11-29: 1000 mg via INTRAVENOUS
  Filled 2016-11-29: qty 10

## 2016-11-29 MED ORDER — OMEPRAZOLE 20 MG PO CPDR
40.0000 mg | DELAYED_RELEASE_CAPSULE | Freq: Every day | ORAL | Status: DC
  Administered 2016-11-30 – 2016-12-01 (×2): 40 mg via ORAL
  Filled 2016-11-29 (×2): qty 2

## 2016-11-29 MED ORDER — BUPIVACAINE-EPINEPHRINE (PF) 0.5% -1:200000 IJ SOLN
INTRAMUSCULAR | Status: DC | PRN
  Administered 2016-11-29: 20 mL via PERINEURAL

## 2016-11-29 MED ORDER — FENTANYL CITRATE (PF) 100 MCG/2ML IJ SOLN
INTRAMUSCULAR | Status: DC | PRN
  Administered 2016-11-29: 25 ug via INTRAVENOUS
  Administered 2016-11-29: 50 ug via INTRAVENOUS
  Administered 2016-11-29: 25 ug via INTRAVENOUS

## 2016-11-29 MED ORDER — PROPOFOL 10 MG/ML IV BOLUS
INTRAVENOUS | Status: DC | PRN
  Administered 2016-11-29 (×4): 10 mg via INTRAVENOUS
  Administered 2016-11-29: 20 mg via INTRAVENOUS
  Administered 2016-11-29: 10 mg via INTRAVENOUS

## 2016-11-29 MED ORDER — MIDAZOLAM HCL 2 MG/2ML IJ SOLN
INTRAMUSCULAR | Status: AC
  Filled 2016-11-29: qty 2

## 2016-11-29 MED ORDER — PHENYLEPHRINE HCL 10 MG/ML IJ SOLN
INTRAVENOUS | Status: DC | PRN
  Administered 2016-11-29: 50 ug/min via INTRAVENOUS

## 2016-11-29 MED ORDER — PHENYLEPHRINE HCL 10 MG/ML IJ SOLN
INTRAMUSCULAR | Status: AC
  Filled 2016-11-29: qty 1

## 2016-11-29 MED ORDER — SODIUM CHLORIDE 0.9 % IJ SOLN
INTRAMUSCULAR | Status: DC | PRN
  Administered 2016-11-29: 10 mL

## 2016-11-29 MED ORDER — POLYVINYL ALCOHOL 1.4 % OP SOLN
1.0000 [drp] | OPHTHALMIC | Status: DC | PRN
  Filled 2016-11-29: qty 15

## 2016-11-29 MED ORDER — FLUTICASONE PROPIONATE 50 MCG/ACT NA SUSP
1.0000 | Freq: Every day | NASAL | Status: DC
  Administered 2016-11-30: 1 via NASAL
  Filled 2016-11-29: qty 16

## 2016-11-29 MED ORDER — DEXAMETHASONE SODIUM PHOSPHATE 10 MG/ML IJ SOLN
10.0000 mg | Freq: Once | INTRAMUSCULAR | Status: AC
  Administered 2016-11-29: 10 mg via INTRAVENOUS

## 2016-11-29 MED ORDER — SODIUM CHLORIDE 0.9 % IJ SOLN
INTRAMUSCULAR | Status: AC
  Filled 2016-11-29: qty 50

## 2016-11-29 MED ORDER — POTASSIUM CHLORIDE CRYS ER 10 MEQ PO TBCR
10.0000 meq | EXTENDED_RELEASE_TABLET | Freq: Every day | ORAL | Status: DC
  Administered 2016-11-30 – 2016-12-01 (×2): 10 meq via ORAL
  Filled 2016-11-29 (×2): qty 1

## 2016-11-29 MED ORDER — PROPOFOL 500 MG/50ML IV EMUL
INTRAVENOUS | Status: DC | PRN
  Administered 2016-11-29: 100 ug/kg/min via INTRAVENOUS

## 2016-11-29 MED ORDER — FENTANYL CITRATE (PF) 100 MCG/2ML IJ SOLN
25.0000 ug | INTRAMUSCULAR | Status: DC | PRN
  Administered 2016-11-29: 19:00:00 50 ug via INTRAVENOUS
  Filled 2016-11-29: qty 2

## 2016-11-29 MED ORDER — METOCLOPRAMIDE HCL 5 MG/ML IJ SOLN
5.0000 mg | Freq: Three times a day (TID) | INTRAMUSCULAR | Status: DC | PRN
  Administered 2016-11-29: 19:00:00 10 mg via INTRAVENOUS
  Filled 2016-11-29: qty 2

## 2016-11-29 MED ORDER — MIDAZOLAM HCL 5 MG/ML IJ SOLN: 2.0000 mg | Freq: Once | INTRAMUSCULAR | Status: DC

## 2016-11-29 MED ORDER — FENTANYL CITRATE (PF) 100 MCG/2ML IJ SOLN
INTRAMUSCULAR | Status: AC
  Filled 2016-11-29: qty 4

## 2016-11-29 MED ORDER — BUPIVACAINE-EPINEPHRINE (PF) 0.25% -1:200000 IJ SOLN
INTRAMUSCULAR | Status: DC | PRN
  Administered 2016-11-29: 10 mL

## 2016-11-29 MED ORDER — KETOROLAC TROMETHAMINE 30 MG/ML IJ SOLN
INTRAMUSCULAR | Status: AC
  Filled 2016-11-29: qty 1

## 2016-11-29 MED ORDER — ASPIRIN 81 MG PO CHEW
81.0000 mg | CHEWABLE_TABLET | Freq: Two times a day (BID) | ORAL | Status: DC
  Administered 2016-11-29 – 2016-12-01 (×4): 81 mg via ORAL
  Filled 2016-11-29 (×4): qty 1

## 2016-11-29 MED ORDER — CEFAZOLIN SODIUM-DEXTROSE 2-4 GM/100ML-% IV SOLN
INTRAVENOUS | Status: AC
  Filled 2016-11-29: qty 100

## 2016-11-29 MED ORDER — METOCLOPRAMIDE HCL 5 MG PO TABS: 5.0000 mg | ORAL_TABLET | Freq: Three times a day (TID) | ORAL | Status: DC | PRN

## 2016-11-29 MED ORDER — METOPROLOL SUCCINATE ER 50 MG PO TB24
50.0000 mg | ORAL_TABLET | Freq: Every day | ORAL | Status: DC
  Administered 2016-11-30 – 2016-12-01 (×2): 50 mg via ORAL
  Filled 2016-11-29 (×2): qty 1

## 2016-11-29 MED ORDER — DOCUSATE SODIUM 100 MG PO CAPS
100.0000 mg | ORAL_CAPSULE | Freq: Two times a day (BID) | ORAL | Status: DC
  Administered 2016-11-30 – 2016-12-01 (×3): 100 mg via ORAL
  Filled 2016-11-29 (×4): qty 1

## 2016-11-29 MED ORDER — CELECOXIB 200 MG PO CAPS
200.0000 mg | ORAL_CAPSULE | Freq: Two times a day (BID) | ORAL | Status: DC
  Administered 2016-11-29 – 2016-12-01 (×4): 200 mg via ORAL
  Filled 2016-11-29 (×4): qty 1

## 2016-11-29 MED ORDER — LORATADINE 10 MG PO TABS
10.0000 mg | ORAL_TABLET | Freq: Every day | ORAL | Status: DC
  Administered 2016-11-30 – 2016-12-01 (×2): 10 mg via ORAL
  Filled 2016-11-29 (×2): qty 1

## 2016-11-29 MED ORDER — ACETAMINOPHEN 500 MG PO TABS
1000.0000 mg | ORAL_TABLET | Freq: Once | ORAL | Status: AC
  Administered 2016-11-29: 1000 mg via ORAL

## 2016-11-29 MED ORDER — STERILE WATER FOR IRRIGATION IR SOLN
Status: DC | PRN
  Administered 2016-11-29: 200 mL

## 2016-11-29 MED ORDER — ISOSORBIDE MONONITRATE ER 60 MG PO TB24
60.0000 mg | ORAL_TABLET | Freq: Every day | ORAL | Status: DC
  Administered 2016-11-30 – 2016-12-01 (×2): 60 mg via ORAL
  Filled 2016-11-29 (×2): qty 1

## 2016-11-29 MED ORDER — SODIUM CHLORIDE 0.9 % IR SOLN
Status: DC | PRN
  Administered 2016-11-29: 1000 mL

## 2016-11-29 MED ORDER — TRANEXAMIC ACID 1000 MG/10ML IV SOLN
1000.0000 mg | INTRAVENOUS | Status: AC
  Administered 2016-11-29: 1000 mg via INTRAVENOUS
  Filled 2016-11-29: qty 1100

## 2016-11-29 MED ORDER — BUPIVACAINE-EPINEPHRINE (PF) 0.25% -1:200000 IJ SOLN
INTRAMUSCULAR | Status: AC
  Filled 2016-11-29: qty 30

## 2016-11-29 MED ORDER — FENTANYL CITRATE (PF) 100 MCG/2ML IJ SOLN
25.0000 ug | INTRAMUSCULAR | Status: DC | PRN
  Administered 2016-11-29 (×3): 50 ug via INTRAVENOUS

## 2016-11-29 MED ORDER — FENTANYL CITRATE (PF) 100 MCG/2ML IJ SOLN
100.0000 ug | Freq: Once | INTRAMUSCULAR | Status: AC
  Administered 2016-11-29: 25 ug via INTRAVENOUS

## 2016-11-29 MED ORDER — PHENOL 1.4 % MT LIQD: 1.0000 | OROMUCOSAL | Status: DC | PRN

## 2016-11-29 MED ORDER — SODIUM CHLORIDE 0.9 % IV SOLN
INTRAVENOUS | Status: DC
Start: 2016-11-29 — End: 2016-12-01
  Administered 2016-11-29 – 2016-11-30 (×3): via INTRAVENOUS

## 2016-11-29 MED ORDER — DIPHENHYDRAMINE HCL 25 MG PO CAPS: 25.0000 mg | ORAL_CAPSULE | Freq: Four times a day (QID) | ORAL | Status: DC | PRN

## 2016-11-29 MED ORDER — METHOCARBAMOL 500 MG PO TABS: 500.0000 mg | ORAL_TABLET | Freq: Four times a day (QID) | ORAL | Status: DC | PRN

## 2016-11-29 MED ORDER — HYPROMELLOSE (GONIOSCOPIC) 2.5 % OP SOLN: 1.0000 [drp] | OPHTHALMIC | Status: DC | PRN

## 2016-11-29 MED ORDER — HYDROCODONE-ACETAMINOPHEN 7.5-325 MG PO TABS
1.0000 | ORAL_TABLET | ORAL | Status: DC
  Administered 2016-11-29 – 2016-12-01 (×8): 1 via ORAL
  Filled 2016-11-29 (×11): qty 1

## 2016-11-29 MED ORDER — 0.9 % SODIUM CHLORIDE (POUR BTL) OPTIME
TOPICAL | Status: DC | PRN
  Administered 2016-11-29: 1000 mL

## 2016-11-29 MED ORDER — BUPIVACAINE IN DEXTROSE 0.75-8.25 % IT SOLN
INTRATHECAL | Status: DC | PRN
  Administered 2016-11-29: 1.6 mL via INTRATHECAL

## 2016-11-29 MED ORDER — PROPOFOL 10 MG/ML IV BOLUS
INTRAVENOUS | Status: AC
  Filled 2016-11-29: qty 40

## 2016-11-29 MED ORDER — BISACODYL 10 MG RE SUPP: 10.0000 mg | Freq: Every day | RECTAL | Status: DC | PRN

## 2016-11-29 MED ORDER — POLYETHYLENE GLYCOL 3350 17 G PO PACK
17.0000 g | PACK | Freq: Two times a day (BID) | ORAL | Status: DC
  Administered 2016-11-30: 17 g via ORAL
  Filled 2016-11-29 (×2): qty 1

## 2016-11-29 MED ORDER — CEFAZOLIN SODIUM-DEXTROSE 2-4 GM/100ML-% IV SOLN
2.0000 g | Freq: Four times a day (QID) | INTRAVENOUS | Status: AC
  Administered 2016-11-29 – 2016-11-30 (×2): 2 g via INTRAVENOUS
  Filled 2016-11-29 (×3): qty 100

## 2016-11-29 MED ORDER — MAGNESIUM CITRATE PO SOLN: 1.0000 | Freq: Once | ORAL | Status: DC | PRN

## 2016-11-29 MED ORDER — ONDANSETRON HCL 4 MG PO TABS: 4.0000 mg | ORAL_TABLET | Freq: Four times a day (QID) | ORAL | Status: DC | PRN

## 2016-11-29 SURGICAL SUPPLY — 47 items
BAG ZIPLOCK 12X15 (MISCELLANEOUS) ×3 IMPLANT
BANDAGE ACE 6X5 VEL STRL LF (GAUZE/BANDAGES/DRESSINGS) ×3 IMPLANT
BLADE SAW SGTL 11.0X1.19X90.0M (BLADE) ×3 IMPLANT
BLADE SAW SGTL 13.0X1.19X90.0M (BLADE) ×3 IMPLANT
BOWL SMART MIX CTS (DISPOSABLE) ×3 IMPLANT
CEMENT HV SMART SET (Cement) ×6 IMPLANT
COVER SURGICAL LIGHT HANDLE (MISCELLANEOUS) ×3 IMPLANT
CUFF TOURN SGL QUICK 34 (TOURNIQUET CUFF) ×2
CUFF TRNQT CYL 34X4X40X1 (TOURNIQUET CUFF) ×1 IMPLANT
DECANTER SPIKE VIAL GLASS SM (MISCELLANEOUS) ×3 IMPLANT
DERMABOND ADVANCED (GAUZE/BANDAGES/DRESSINGS) ×2
DERMABOND ADVANCED .7 DNX12 (GAUZE/BANDAGES/DRESSINGS) ×1 IMPLANT
DRAPE U-SHAPE 47X51 STRL (DRAPES) ×3 IMPLANT
DRESSING AQUACEL AG SP 3.5X10 (GAUZE/BANDAGES/DRESSINGS) ×1 IMPLANT
DRSG AQUACEL AG SP 3.5X10 (GAUZE/BANDAGES/DRESSINGS) ×3
DURAPREP 26ML APPLICATOR (WOUND CARE) ×6 IMPLANT
ELECT REM PT RETURN 15FT ADLT (MISCELLANEOUS) ×3 IMPLANT
FEM TC3 PFC SZ2 LEFT (Orthopedic Implant) ×3 IMPLANT
FEMORAL TC3 PFC SZ2 LEFT (Orthopedic Implant) ×1 IMPLANT
GLOVE BIOGEL M STRL SZ7.5 (GLOVE) ×6 IMPLANT
GLOVE BIOGEL PI IND STRL 7.5 (GLOVE) ×7 IMPLANT
GLOVE BIOGEL PI IND STRL 8.5 (GLOVE) ×1 IMPLANT
GLOVE BIOGEL PI INDICATOR 7.5 (GLOVE) ×14
GLOVE BIOGEL PI INDICATOR 8.5 (GLOVE) ×2
GLOVE ORTHO TXT STRL SZ7.5 (GLOVE) ×6 IMPLANT
GLOVE SURG SS PI 7.5 STRL IVOR (GLOVE) ×3 IMPLANT
GOWN STRL REUS W/ TWL XL LVL3 (GOWN DISPOSABLE) ×1 IMPLANT
GOWN STRL REUS W/TWL LRG LVL3 (GOWN DISPOSABLE) ×3 IMPLANT
GOWN STRL REUS W/TWL XL LVL3 (GOWN DISPOSABLE) ×8 IMPLANT
HANDPIECE INTERPULSE COAX TIP (DISPOSABLE) ×2
INSERT TIBIAL TC3 RP SZ 2 15 (Knees) ×3 IMPLANT
MANIFOLD NEPTUNE II (INSTRUMENTS) ×3 IMPLANT
PACK TOTAL KNEE CUSTOM (KITS) ×3 IMPLANT
PATELLA DOME PFC 35MM (Knees) ×3 IMPLANT
POSITIONER SURGICAL ARM (MISCELLANEOUS) ×3 IMPLANT
SET HNDPC FAN SPRY TIP SCT (DISPOSABLE) ×1 IMPLANT
SET PAD KNEE POSITIONER (MISCELLANEOUS) ×3 IMPLANT
SUT MNCRL AB 4-0 PS2 18 (SUTURE) ×3 IMPLANT
SUT STRATAFIX 0 PDS 27 VIOLET (SUTURE) ×3
SUT VIC AB 1 CT1 36 (SUTURE) ×3 IMPLANT
SUT VIC AB 2-0 CT1 27 (SUTURE) ×6
SUT VIC AB 2-0 CT1 TAPERPNT 27 (SUTURE) ×3 IMPLANT
SUTURE STRATFX 0 PDS 27 VIOLET (SUTURE) ×1 IMPLANT
TRAY FOLEY CATH SILVER 14FR (SET/KITS/TRAYS/PACK) ×3 IMPLANT
TRAY TIB SZ 2 REVISION (Knees) ×3 IMPLANT
WRAP KNEE MAXI GEL POST OP (GAUZE/BANDAGES/DRESSINGS) ×3 IMPLANT
YANKAUER SUCT BULB TIP 10FT TU (MISCELLANEOUS) ×3 IMPLANT

## 2016-11-29 NOTE — Interval H&P Note (Signed)
History and Physical Interval Note:  11/29/2016 12:51 PM  [name]  has presented today for surgery, with the diagnosis of Left knee osteoarthritis   The various methods of treatment have been discussed with the patient and family. After consideration of risks, benefits and other options for treatment, the patient has consented to  Procedure(s): LEFT TOTAL KNEE ARTHROPLASTY (Left) as a surgical intervention .  The patient's history has been reviewed, patient examined, no change in status, stable for surgery.  I have reviewed the patient's chart and labs.  Questions were answered to the patient's satisfaction.     Mauri Pole

## 2016-11-29 NOTE — Anesthesia Preprocedure Evaluation (Addendum)
Anesthesia Evaluation  Patient identified by MRN, date of birth, ID band Patient awake    Reviewed: Allergy & Precautions, NPO status , Patient's Chart, lab work & pertinent test results, reviewed documented beta blocker date and time   History of Anesthesia Complications (+) PONV and history of anesthetic complications  Airway Mallampati: II  TM Distance: <3 FB Neck ROM: Full    Dental  (+) Dental Advisory Given, Lower Dentures, Upper Dentures   Pulmonary asthma , COPD,  COPD inhaler and oxygen dependent, former smoker,  Pulmonary fibrosis   Pulmonary exam normal breath sounds clear to auscultation       Cardiovascular hypertension, Pt. on medications and Pt. on home beta blockers + CAD, + Past MI, + CABG and + Peripheral Vascular Disease (bilateral carotid stenosis)  Normal cardiovascular exam Rhythm:Regular Rate:Normal  Stress test 11/17/16: Nuclear stress EF: 58%. Defect 1: There is a small defect of mild severity present in the mid anterior location. This is an intermediate risk study. Defect 2: There is a small defect of mild severity present in the basal inferior and mid inferior location.   Intermediate risk study due to equivocal findings. No high risk findings are present. There is a fixed small anterior defect that may represent scar or resting ischemia in the territory of a diagonal artery. There is probably mild inferior wall ischemia, but evaluation is hampered by infradiaphragmatic tracer activity. There are no large areas of ischemia. Normal left ventricular regional and global systolic function.   Neuro/Psych PSYCHIATRIC DISORDERS Depression negative neurological ROS     GI/Hepatic Neg liver ROS, GERD  Medicated,  Endo/Other  negative endocrine ROS  Renal/GU negative Renal ROS     Musculoskeletal negative musculoskeletal ROS (+)   Abdominal   Peds  Hematology  (+) Blood dyscrasia, anemia , Plt  243k   Anesthesia Other Findings Day of surgery medications reviewed with the patient.  Reproductive/Obstetrics                           Anesthesia Physical Anesthesia Plan  ASA: III  Anesthesia Plan: Spinal   Post-op Pain Management:    Induction:   PONV Risk Score and Plan: 3 and Ondansetron, Dexamethasone and Treatment may vary due to age or medical condition  Airway Management Planned: Simple Face Mask  Additional Equipment:   Intra-op Plan:   Post-operative Plan:   Informed Consent: I have reviewed the patients History and Physical, chart, labs and discussed the procedure including the risks, benefits and alternatives for the proposed anesthesia with the patient or authorized representative who has indicated his/her understanding and acceptance.   Dental advisory given  Plan Discussed with: CRNA, Anesthesiologist and Surgeon  Anesthesia Plan Comments: (Discussed risks and benefits of and differences between spinal and general. Discussed risks of spinal including headache, backache, failure, bleeding, infection, and nerve damage. Patient consents to spinal. Questions answered. Coagulation studies and platelet count acceptable.)       Anesthesia Quick Evaluation

## 2016-11-29 NOTE — Anesthesia Procedure Notes (Signed)
Anesthesia Regional Block: Adductor canal block   Pre-Anesthetic Checklist: ,, timeout performed, Correct Patient, Correct Site, Correct Laterality, Correct Procedure, Correct Position, site marked, Risks and benefits discussed,  Surgical consent,  Pre-op evaluation,  At surgeon's request and post-op pain management  Laterality: Left  Prep: chloraprep       Needles:  Injection technique: Single-shot  Needle Type: Echogenic Needle     Needle Length: 9cm  Needle Gauge: 21     Additional Needles:   Procedures: ultrasound guided,,,,,,,,  Narrative:  Start time: 11/29/2016 1:36 PM End time: 11/29/2016 1:41 PM Injection made incrementally with aspirations every 5 mL.  Performed by: Personally  Anesthesiologist: Catalina Gravel  Additional Notes: No pain on injection. No increased resistance to injection. Injection made in 5cc increments.  Good needle visualization.  Patient tolerated procedure well.

## 2016-11-29 NOTE — Anesthesia Procedure Notes (Signed)
Date/Time: 11/29/2016 2:23 PM Performed by: Danley Danker L Oxygen Delivery Method: Simple face mask

## 2016-11-29 NOTE — Transfer of Care (Signed)
Immediate Anesthesia Transfer of Care Note  Patient: [name]  Procedure(s) Performed: Procedure(s) with comments: LEFT TOTAL KNEE ARTHROPLASTY (Left) - Adductor Block  Patient Location: PACU  Anesthesia Type:Spinal  Level of Consciousness: awake, alert  and oriented  Airway & Oxygen Therapy: Patient Spontanous Breathing and Patient connected to nasal cannula oxygen  Post-op Assessment: Report given to RN and Post -op Vital signs reviewed and stable  Post vital signs: Reviewed and stable  Last Vitals:  Vitals:   11/29/16 1343 11/29/16 1345  BP: (!) 171/75 (!) 181/58  Pulse: 76 68  Resp: 14 (!) 26  Temp:      Last Pain:  Vitals:   11/29/16 1336  TempSrc:   PainSc: 0-No pain         Complications: No apparent anesthesia complications

## 2016-11-29 NOTE — Progress Notes (Signed)
AssistedDr. Turk with left, ultrasound guided, adductor canal block. Side rails up, monitors on throughout procedure. See vital signs in flow sheet. Tolerated Procedure well.  

## 2016-11-29 NOTE — Anesthesia Procedure Notes (Addendum)
Spinal  Patient location during procedure: OR Start time: 11/29/2016 2:24 PM End time: 11/29/2016 2:29 PM Staffing Resident/CRNA: Danley Danker L Performed: resident/CRNA  Preanesthetic Checklist Completed: patient identified, site marked, surgical consent, pre-op evaluation, timeout performed, IV checked, risks and benefits discussed and monitors and equipment checked Spinal Block Patient position: sitting Prep: Betadine Patient monitoring: heart rate, continuous pulse ox and blood pressure Approach: midline Location: L4-5 Injection technique: single-shot Needle Needle type: Sprotte  Needle gauge: 24 G Needle length: 9 cm Additional Notes Kit expiration date 05/26/2019 and lot # [id number] Clear free flow CSF, negative heme, negative paresthesia Tolerated well and returned to supine position

## 2016-11-29 NOTE — Anesthesia Postprocedure Evaluation (Signed)
Anesthesia Post Note  Patient: [name]  Procedure(s) Performed: Procedure(s) (LRB): LEFT TOTAL KNEE ARTHROPLASTY (Left)     Patient location during evaluation: PACU Anesthesia Type: Spinal Level of consciousness: oriented and awake and alert Pain management: pain level controlled Vital Signs Assessment: post-procedure vital signs reviewed and stable Respiratory status: spontaneous breathing, respiratory function stable and patient connected to nasal cannula oxygen Cardiovascular status: blood pressure returned to baseline and stable Postop Assessment: no headache, no backache and spinal receding Anesthetic complications: no    Last Vitals:  Vitals:   11/29/16 1700 11/29/16 1715  BP: (!) 139/58   Pulse: 85   Resp: 10   Temp:  36.6 C    Last Pain:  Vitals:   11/29/16 1715  TempSrc:   PainSc: 2     LLE Motor Response: Purposeful movement;Responds to commands (11/29/16 1715) LLE Sensation: Full sensation (11/29/16 1715) RLE Motor Response: Responds to commands;Purposeful movement (11/29/16 1715) RLE Sensation: Full sensation (11/29/16 1715) L Sensory Level: S1-Sole of foot, small toes (11/29/16 1715) R Sensory Level: S1-Sole of foot, small toes (11/29/16 1715)  Catalina Gravel

## 2016-11-29 NOTE — Op Note (Signed)
This note was dictated under separate report

## 2016-11-29 NOTE — Discharge Instructions (Signed)

## 2016-11-30 LAB — BASIC METABOLIC PANEL
Anion gap: 10 (ref 5–15)
BUN: 20 mg/dL (ref 6–20)
CALCIUM: 8.5 mg/dL — AB (ref 8.9–10.3)
CHLORIDE: 105 mmol/L (ref 101–111)
CO2: 23 mmol/L (ref 22–32)
CREATININE: 0.95 mg/dL (ref 0.44–1.00)
GFR calc non Af Amer: 53 mL/min — ABNORMAL LOW (ref >60)
Glucose, Bld: 180 mg/dL — ABNORMAL HIGH (ref 65–99)
Potassium: 3.8 mmol/L (ref 3.5–5.1)
SODIUM: 138 mmol/L (ref 135–145)

## 2016-11-30 LAB — CBC
HCT: 28.6 % — ABNORMAL LOW (ref 36.0–46.0)
HEMOGLOBIN: 9.8 g/dL — AB (ref 12.0–15.0)
MCH: 31 pg (ref 26.0–34.0)
MCHC: 34.3 g/dL (ref 30.0–36.0)
MCV: 90.5 fL (ref 78.0–100.0)
Platelets: 200 10*3/uL (ref 150–400)
RBC: 3.16 MIL/uL — ABNORMAL LOW (ref 3.87–5.11)
RDW: 12.9 % (ref 11.5–15.5)
WBC: 8 10*3/uL (ref 4.0–10.5)

## 2016-11-30 NOTE — Clinical Social Work Note (Signed)
Clinical Social Work Assessment  Patient Details  Name: [name] MRN: [id number] Date of Birth: 09/29/1930  Date of referral:  11/30/16               Reason for consult:  Discharge Planning                Permission sought to share information with:  Chartered certified accountant granted to share information::  Yes, Verbal Permission Granted  Name::        Agency::     Relationship::     Contact Information:     Housing/Transportation Living arrangements for the past 2 months:  Single Family Home Source of Information:  Patient, Adult Children Patient Interpreter Needed:  None Criminal Activity/Legal Involvement Pertinent to Current Situation/Hospitalization:  No - Comment as needed Significant Relationships:  Adult Children Lives with:  Adult Children Do you feel safe going back to the place where you live?   (SNF needed.) Need for family participation in patient care:  Yes (Comment)  Care giving concerns:  Pt lives alone and requires more assistance than available at dc.   Social Worker assessment / plan:  Pt hospitalized on 11/29/16 from home for pre planned left total knee arthroplasty. Pt is part of the medicare bundle program and has a pre arranged ST Rehab bed at [facility] once stable for dc. CSW met with pt / daughter at bedside this am and dc plan confirmed. Clinicals have been sent to [facility] and dc plan has been confirmed with SNF. CSW will continue to follow to assist with dc planning to SNF.  Employment status:  Other (Comment) Insurance information:  Medicare PT Recommendations:  [place] / Referral to community resources:  [place]  Patient/Family's Response to care:  Pt / daughter are in agreement with plan for SNF at dc.  Patient/Family's Understanding of and Emotional Response to Diagnosis, Current Treatment, and Prognosis:  Pt /  daughter met with PA this am and medical status  reviewed. Pt is motivated to work with therapy. She is concerned that she may need more than the pre arranged [facility]. Pt reassured that ongoing dc assistance will be provided at SNF. Emotional Assessment Appearance:  Appears stated age Attitude/Demeanor/Rapport:  Other (cooperative) Affect (typically observed):  Appropriate Orientation:  Oriented to Self, Oriented to Place, Oriented to  Time, Oriented to Situation, Fluctuating Orientation (Suspected and/or reported Sundowners) Alcohol / Substance use:  Not Applicable Psych involvement (Current and /or in the community):  No (Comment)  Discharge Needs  Concerns to be addressed:  Discharge Planning Concerns Readmission within the last 30 days:  No Current discharge risk:  None Barriers to Discharge:  No Barriers Identified   Luretha Rued, [place] 11/30/2016, 10:56 AM

## 2016-11-30 NOTE — Clinical Social Work Placement (Signed)
   CLINICAL SOCIAL WORK PLACEMENT  NOTE  Date:  11/30/2016  Patient Details  Name: [name] MRN: [id number] Date of Birth: [birth year]  Clinical Social Work is seeking post-discharge placement for this patient at the [place] level of care (*CSW will initial, date and re-position this form in  chart as items are completed):  No   Patient/family provided with [facility] Work Department's list of facilities offering this level of care within the geographic area requested by the patient (or if unable, by the patient's family).  Yes   Patient/family informed of their freedom to choose among providers that offer the needed level of care, that participate in Medicare, Medicaid or managed care program needed by the patient, have an available bed and are willing to accept the patient.  Yes   Patient/family informed of [facility]'s ownership interest in [facility] and [facility], as well as of the fact that they are under no obligation to receive care at these facilities.  PASRR submitted to EDS on 11/30/16     PASRR number received on 11/30/16     Existing PASRR number confirmed on       FL2 transmitted to all facilities in geographic area requested by pt/family on 11/30/16     FL2 transmitted to all facilities within larger geographic area on       Patient informed that his/her managed care company has contracts with or will negotiate with certain facilities, including the following:        Yes   Patient/family informed of bed offers received.  Patient chooses bed at [facility] [place] recommends and patient chooses bed at      Patient to be transferred to   on  .  Patient to be transferred to facility by       Patient family notified on   of transfer.  Name of family member notified:        PHYSICIAN       Additional Comment:    _______________________________________________ Luretha Rued, [place] 11/30/2016, 11:31 AM

## 2016-11-30 NOTE — Progress Notes (Signed)
     Subjective: 1 Day Post-Op Procedure(s) (LRB): LEFT TOTAL KNEE ARTHROPLASTY (Left)   Patient reports pain as mild, pain controlled. No events throughout the night.  Working well with PT.  Objective:   VITALS:   Vitals:   11/30/16 0534 11/30/16 0900  BP: (!) 127/43 (!) 138/45  Pulse: 64 66  Resp: 17 16  Temp: 98.7 F (37.1 C) 98.7 F (37.1 C)    Dorsiflexion/Plantar flexion intact Incision: dressing C/D/I No cellulitis present Compartment soft  LABS  Recent Labs  11/30/16 0552  HGB 9.8*  HCT 28.6*  WBC 8.0  PLT 200     Recent Labs  11/30/16 0552  NA 138  K 3.8  BUN 20  CREATININE 0.95  GLUCOSE 180*     Assessment/Plan: 1 Day Post-Op Procedure(s) (LRB): LEFT TOTAL KNEE ARTHROPLASTY (Left) Foley cath d/c'ed Advance diet Up with therapy D/C IV fluids Discharge to SNF, eventually when ready    [place]. Rakeya Glab   PAC  11/30/2016, 9:33 AM

## 2016-11-30 NOTE — Progress Notes (Signed)
OT Cancellation Note  Patient Details Name: [name] MRN: [id number] DOB: 07-21-1930   Cancelled Treatment:    Reason Eval/Treat Not Completed: OT screened, no needs identified, will sign off.  Pt with plan to discharge to SNF.  OT needs can be addressed at SNF.  [name], OTR/L [phone]   [name] 11/30/2016, 1:50 PM

## 2016-11-30 NOTE — Evaluation (Signed)
Physical Therapy Evaluation Patient Details Name: [name] MRN: [id number] DOB: 1930/12/18 Today's Date: 11/30/2016   History of Present Illness  Pt L TKR and with hx of CAD, CABG, carotic stenosis and chronic pain syndrome  Clinical Impression  Pt s/p L TKR and presents with decreased L LE strength/ROM and post op pain limiting functional mobility.  Pt would benefit from follow up rehab at SNF level to maximize IND and safety prior to return home with ltd assist.    Follow Up Recommendations SNF    Equipment Recommendations  None recommended by PT    Recommendations for Other Services OT consult     Precautions / Restrictions Precautions Precautions: Knee;Fall Restrictions Weight Bearing Restrictions: No Other Position/Activity Restrictions: WBAT      Mobility  Bed Mobility Overal bed mobility: Needs Assistance Bed Mobility: Supine to Sit     Supine to sit: Min assist     General bed mobility comments: cues for sequence and use of R LE to self assist  Transfers Overall transfer level: Needs assistance   Transfers: Sit to/from Stand Sit to Stand: Min assist         General transfer comment: cues for LE management and use of UEs to self assist  Ambulation/Gait Ambulation/Gait assistance: Min assist Ambulation Distance (Feet): 50 Feet Assistive device: Rolling walker (2 wheeled) Gait Pattern/deviations: Step-to pattern;Decreased step length - right;Decreased step length - left;Shuffle;Trunk flexed Gait velocity: decr Gait velocity interpretation: Below normal speed for age/gender General Gait Details: cues for posture, sequence and position from [employer]            Wheelchair Mobility    Modified Rankin (Stroke Patients Only)       Balance                                             Pertinent Vitals/Pain Pain Assessment: 0-10 Pain Score: 5  Pain Location: L knee Pain Descriptors / Indicators: Aching;Sore Pain  Intervention(s): Limited activity within patient's tolerance;Monitored during session;Premedicated before session;Ice applied    Home Living Family/patient expects to be discharged to:: Skilled nursing facility Living Arrangements: Children                    Prior Function Level of Independence: Independent with assistive device(s)         Comments: Pt ambulating ltd distances 2* knee pain     Hand Dominance   Dominant Hand: Right    Extremity/Trunk Assessment   Upper Extremity Assessment Upper Extremity Assessment: Overall WFL for tasks assessed    Lower Extremity Assessment Lower Extremity Assessment: LLE deficits/detail LLE Deficits / Details: 3-/5 quads with AAROM at knee -10- 70       Communication   Communication: No difficulties  Cognition Arousal/Alertness: Awake/alert Behavior During Therapy: WFL for tasks assessed/performed Overall Cognitive Status: Within Functional Limits for tasks assessed                                        General Comments      Exercises Total Joint Exercises Ankle Circles/Pumps: AROM;Both;15 reps;Supine Quad Sets: AROM;Both;10 reps;Supine Heel Slides: AAROM;Left;15 reps;Supine Straight Leg Raises: AAROM;AROM;Left;10 reps;Supine   Assessment/Plan    PT Assessment Patient needs continued PT services  PT Problem  List Decreased strength;Decreased range of motion;Decreased activity tolerance;Decreased mobility;Pain;Decreased knowledge of use of DME       PT Treatment Interventions DME instruction;Gait training;Stair training;Functional mobility training;Therapeutic activities;Therapeutic exercise;Patient/family education    PT Goals (Current goals can be found in the Care Plan section)  Acute Rehab PT Goals Patient Stated Goal: Rehab to regain IND PT Goal Formulation: With patient Time For Goal Achievement: 12/06/16 Potential to Achieve Goals: Good    Frequency 7X/week   Barriers to discharge         Co-evaluation               AM-PAC PT "6 Clicks" Daily Activity  Outcome Measure Difficulty turning over in bed (including adjusting bedclothes, sheets and blankets)?: Total Difficulty moving from lying on back to sitting on the side of the bed? : Total Difficulty sitting down on and standing up from a chair with arms (e.g., wheelchair, bedside commode, etc,.)?: Total Help needed moving to and from a bed to chair (including a wheelchair)?: A Little Help needed walking in hospital room?: A Little Help needed climbing 3-5 steps with a railing? : A Lot 6 Click Score: 11    End of Session Equipment Utilized During Treatment: Gait belt Activity Tolerance: Patient tolerated treatment well Patient left: in chair;with call bell/phone within reach;with family/visitor present Nurse Communication: Mobility status PT Visit Diagnosis: Difficulty in walking, not elsewhere classified (R26.2)    Time: 6073-7106 PT Time Calculation (min) (ACUTE ONLY): 42 min   Charges:   PT Evaluation $PT Eval Low Complexity: 1 Low PT Treatments $Gait Training: 8-22 mins $Therapeutic Exercise: 8-22 mins   PT G Codes:        Pg [phone]   Alfio Loescher 11/30/2016, 12:54 PM

## 2016-11-30 NOTE — NC FL2 (Signed)
[place] LEVEL OF CARE SCREENING TOOL     IDENTIFICATION  Patient Name: [name] Birthdate: 1931-03-21 Sex: female Admission Date (Current Location): 11/29/2016  [facility] and [place] Number:  Herbalist and Address:  [facility],  [place] [address] [place]      Provider Number: [id number]  Attending Physician Name and Address:  Paralee Cancel, MD  Relative Name and Phone Number:       Current Level of Care: Hospital Recommended Level of Care: [place] Prior Approval Number:    Date Approved/Denied:   PASRR Number:    Discharge Plan: SNF    Current Diagnoses: Patient Active Problem List   Diagnosis Date Noted  . S/P total knee replacement 11/29/2016  . Pulmonary emphysema ([place]) 09/23/2016  . History of influenza 09/02/2016  . Edema of left lower extremity 09/02/2016  . IPF (idiopathic pulmonary fibrosis) ([place]) 09/02/2016  . Family history of COPD (chronic obstructive pulmonary disease) 09/02/2016  . COPD exacerbation ([place])   . Hyponatremia 05/15/2016  . Acute on chronic respiratory failure with hypoxemia ([place]) 05/15/2016  . Acute respiratory failure with hypoxia ([place]) 05/15/2016  . Overweight (BMI 25.0-29.9) 02/11/2016  . Depression 01/08/2014  . Asthma, chronic 01/08/2014  . GERD (gastroesophageal reflux disease) 01/08/2014  . Chronic pain syndrome 05/17/2013  . Insomnia 05/17/2013  . Elevated serum creatinine 11/22/2012  . CAROTID BRUIT 12/31/2008  . Hyperlipidemia 07/09/2008  . Essential hypertension, benign 07/09/2008  . Coronary atherosclerosis 07/09/2008  . CORONARY ARTERY BYPASS GRAFT, THREE VESSEL, HX OF 06/29/1994    Orientation RESPIRATION BLADDER Height & Weight     Self, Time, Situation, Place  O2 Indwelling catheter Weight: 52.2 kg (115 lb) Height:  5\' 1"  (154.9 cm)  BEHAVIORAL SYMPTOMS/MOOD NEUROLOGICAL BOWEL NUTRITION STATUS  Other (Comment) (no behaviors)   Continent Diet   AMBULATORY STATUS COMMUNICATION OF NEEDS Skin   Limited Assist Verbally Surgical wounds                       Personal Care Assistance Level of Assistance  Bathing, Feeding, Dressing Bathing Assistance: Limited assistance Feeding assistance: Independent Dressing Assistance: Limited assistance     Functional Limitations Info  Sight, Hearing, Speech Sight Info: Adequate Hearing Info: Adequate Speech Info: Adequate    SPECIAL CARE FACTORS FREQUENCY  PT (By licensed PT), OT (By licensed OT)     PT Frequency: 5x wk OT Frequency: 5x wk            Contractures Contractures Info: Not present    Additional Factors Info  Code Status, Allergies Code Status Info: Full Code Allergies Info:  Lunesta Eszopiclone, Ultram Tramadol, Lisinopril, Simvastatin, Statins           Current Medications (11/30/2016):  This is the current hospital active medication list Current Facility-Administered Medications  Medication Dose Route Frequency Provider Last Rate Last Dose  . 0.9 %  sodium chloride infusion   Intravenous Continuous Danae Orleans, PA-C 100 mL/hr at 11/30/16 0444    . albuterol (PROVENTIL) (2.5 MG/3ML) 0.083% nebulizer solution 2.5 mg  2.5 mg Nebulization Q6H PRN Paralee Cancel, MD      . alum & mag hydroxide-simeth (MAALOX/MYLANTA) 200-200-20 MG/5ML suspension 15 mL  15 mL Oral Q4H PRN Babish, Matthew, PA-C      . amLODipine (NORVASC) tablet 10 mg  10 mg Oral Daily Danae Orleans, PA-C   10 mg at 11/30/16 1601  . aspirin chewable tablet 81  mg  81 mg Oral BID Danae Orleans, PA-C   81 mg at 11/30/16 0800  . bisacodyl (DULCOLAX) suppository 10 mg  10 mg Rectal Daily PRN Danae Orleans, PA-C      . celecoxib (CELEBREX) capsule 200 mg  200 mg Oral Q12H Danae Orleans, PA-C   200 mg at 11/30/16 3016  . dexamethasone (DECADRON) injection 10 mg  10 mg Intravenous Once Babish, Matthew, PA-C      . diphenhydrAMINE (BENADRYL) capsule 25 mg  25 mg Oral Q6H PRN Danae Orleans,  PA-C      . docusate sodium (COLACE) capsule 100 mg  100 mg Oral BID Danae Orleans, PA-C   100 mg at 11/30/16 0109  . fentaNYL (SUBLIMAZE) injection 25-50 mcg  25-50 mcg Intravenous Q2H PRN Danae Orleans, PA-C   50 mcg at 11/29/16 1834  . ferrous sulfate tablet 325 mg  325 mg Oral TID PC Danae Orleans, PA-C   325 mg at 11/30/16 3235  . fluticasone (FLONASE) 50 MCG/ACT nasal spray 1 spray  1 spray Each Nare Daily Danae Orleans, PA-C   1 spray at 11/30/16 1000  . guaiFENesin (MUCINEX) 12 hr tablet 600 mg  600 mg Oral BID Danae Orleans, PA-C   600 mg at 11/30/16 5732  . HYDROcodone-acetaminophen (NORCO) 7.5-325 MG per tablet 1-2 tablet  1-2 tablet Oral Q4H Danae Orleans, PA-C   1 tablet at 11/30/16 0724  . isosorbide mononitrate (IMDUR) 24 hr tablet 60 mg  60 mg Oral Daily Danae Orleans, PA-C   60 mg at 11/30/16 2025  . loratadine (CLARITIN) tablet 10 mg  10 mg Oral Daily Danae Orleans, PA-C   10 mg at 11/30/16 4270  . magnesium citrate solution 1 Bottle  1 Bottle Oral Once PRN Babish, Matthew, PA-C      . menthol-cetylpyridinium (CEPACOL) lozenge 3 mg  1 lozenge Oral PRN Danae Orleans, PA-C       Or  . phenol (CHLORASEPTIC) mouth spray 1 spray  1 spray Mouth/Throat PRN Babish, Matthew, PA-C      . methocarbamol (ROBAXIN) tablet 500 mg  500 mg Oral Q6H PRN Danae Orleans, PA-C       Or  . methocarbamol (ROBAXIN) 500 mg in dextrose 5 % 50 mL IVPB  500 mg Intravenous Q6H PRN Danae Orleans, PA-C 110 mL/hr at 11/29/16 1644 500 mg at 11/29/16 1644  . metoCLOPramide (REGLAN) tablet 5-10 mg  5-10 mg Oral Q8H PRN Danae Orleans, PA-C       Or  . metoCLOPramide (REGLAN) injection 5-10 mg  5-10 mg Intravenous Q8H PRN Danae Orleans, PA-C   10 mg at 11/29/16 1849  . metoprolol succinate (TOPROL-XL) 24 hr tablet 50 mg  50 mg Oral Daily Danae Orleans, PA-C   50 mg at 11/30/16 6237  . montelukast (SINGULAIR) tablet 10 mg  10 mg Oral QHS Danae Orleans, PA-C   10 mg at 11/29/16 2130  .  omeprazole (PRILOSEC) capsule 40 mg  40 mg Oral QAC breakfast Paralee Cancel, MD   40 mg at 11/30/16 0724  . ondansetron (ZOFRAN) tablet 4 mg  4 mg Oral Q6H PRN Danae Orleans, PA-C       Or  . ondansetron [facility]) injection 4 mg  4 mg Intravenous Q6H PRN Danae Orleans, PA-C   4 mg at 11/29/16 1803  . polyethylene glycol (MIRALAX / GLYCOLAX) packet 17 g  17 g Oral BID Danae Orleans, PA-C   17 g at 11/30/16 6283  . polyvinyl alcohol (LIQUIFILM TEARS)  1.4 % ophthalmic solution 1 drop  1 drop Both Eyes PRN Paralee Cancel, MD      . potassium chloride (K-DUR,KLOR-CON) CR tablet 10 mEq  10 mEq Oral Daily Danae Orleans, PA-C   10 mEq at 11/30/16 8937     Discharge Medications: Please see discharge summary for a list of discharge medications.  Relevant Imaging Results:  Relevant Lab Results:   Additional Information SS # [id number]  [place]ious Michele, Randall An, LCSW

## 2016-12-01 DIAGNOSIS — I00 Rheumatic fever without heart involvement: Secondary | ICD-10-CM | POA: Diagnosis not present

## 2016-12-01 DIAGNOSIS — D509 Iron deficiency anemia, unspecified: Secondary | ICD-10-CM | POA: Diagnosis not present

## 2016-12-01 DIAGNOSIS — I10 Essential (primary) hypertension: Secondary | ICD-10-CM | POA: Diagnosis not present

## 2016-12-01 DIAGNOSIS — I739 Peripheral vascular disease, unspecified: Secondary | ICD-10-CM | POA: Diagnosis not present

## 2016-12-01 DIAGNOSIS — M1990 Unspecified osteoarthritis, unspecified site: Secondary | ICD-10-CM | POA: Diagnosis not present

## 2016-12-01 DIAGNOSIS — Z96652 Presence of left artificial knee joint: Secondary | ICD-10-CM | POA: Diagnosis not present

## 2016-12-01 DIAGNOSIS — J45909 Unspecified asthma, uncomplicated: Secondary | ICD-10-CM | POA: Diagnosis not present

## 2016-12-01 DIAGNOSIS — J449 Chronic obstructive pulmonary disease, unspecified: Secondary | ICD-10-CM | POA: Diagnosis not present

## 2016-12-01 DIAGNOSIS — I2510 Atherosclerotic heart disease of native coronary artery without angina pectoris: Secondary | ICD-10-CM | POA: Diagnosis not present

## 2016-12-01 DIAGNOSIS — I252 Old myocardial infarction: Secondary | ICD-10-CM | POA: Diagnosis not present

## 2016-12-01 DIAGNOSIS — M1712 Unilateral primary osteoarthritis, left knee: Secondary | ICD-10-CM | POA: Diagnosis not present

## 2016-12-01 DIAGNOSIS — Z85828 Personal history of other malignant neoplasm of skin: Secondary | ICD-10-CM | POA: Diagnosis not present

## 2016-12-01 DIAGNOSIS — K219 Gastro-esophageal reflux disease without esophagitis: Secondary | ICD-10-CM | POA: Diagnosis not present

## 2016-12-01 DIAGNOSIS — Z471 Aftercare following joint replacement surgery: Secondary | ICD-10-CM | POA: Diagnosis not present

## 2016-12-01 DIAGNOSIS — E785 Hyperlipidemia, unspecified: Secondary | ICD-10-CM | POA: Diagnosis not present

## 2016-12-01 LAB — BASIC METABOLIC PANEL
Anion gap: 6 (ref 5–15)
BUN: 25 mg/dL — ABNORMAL HIGH (ref 6–20)
CHLORIDE: 106 mmol/L (ref 101–111)
CO2: 25 mmol/L (ref 22–32)
Calcium: 8.7 mg/dL — ABNORMAL LOW (ref 8.9–10.3)
Creatinine, Ser: 0.81 mg/dL (ref 0.44–1.00)
GFR calc non Af Amer: >60 mL/min (ref >60)
Glucose, Bld: 109 mg/dL — ABNORMAL HIGH (ref 65–99)
POTASSIUM: 4.3 mmol/L (ref 3.5–5.1)
SODIUM: 137 mmol/L (ref 135–145)

## 2016-12-01 LAB — CBC
HCT: 26.7 % — ABNORMAL LOW (ref 36.0–46.0)
HEMOGLOBIN: 9.3 g/dL — AB (ref 12.0–15.0)
MCH: 31.3 pg (ref 26.0–34.0)
MCHC: 34.8 g/dL (ref 30.0–36.0)
MCV: 89.9 fL (ref 78.0–100.0)
Platelets: 176 10*3/uL (ref 150–400)
RBC: 2.97 MIL/uL — AB (ref 3.87–5.11)
RDW: 13.2 % (ref 11.5–15.5)
WBC: 9.9 10*3/uL (ref 4.0–10.5)

## 2016-12-01 MED ORDER — HYDROCODONE-ACETAMINOPHEN 7.5-325 MG PO TABS: 1.0000 | ORAL_TABLET | ORAL | 0 refills | Status: DC | PRN

## 2016-12-01 MED ORDER — METHOCARBAMOL 500 MG PO TABS: 500.0000 mg | ORAL_TABLET | Freq: Four times a day (QID) | ORAL | 0 refills | Status: DC | PRN

## 2016-12-01 MED ORDER — ASPIRIN 81 MG PO CHEW: 81.0000 mg | CHEWABLE_TABLET | Freq: Two times a day (BID) | ORAL | 0 refills | Status: AC

## 2016-12-01 MED ORDER — DOCUSATE SODIUM 100 MG PO CAPS: 100.0000 mg | ORAL_CAPSULE | Freq: Two times a day (BID) | ORAL | 0 refills | Status: DC

## 2016-12-01 MED ORDER — FERROUS SULFATE 325 (65 FE) MG PO TABS: 325.0000 mg | ORAL_TABLET | Freq: Three times a day (TID) | ORAL | 3 refills | Status: DC

## 2016-12-01 MED ORDER — POLYETHYLENE GLYCOL 3350 17 G PO PACK: 17.0000 g | PACK | Freq: Two times a day (BID) | ORAL | 0 refills | Status: DC

## 2016-12-01 NOTE — Progress Notes (Signed)
Physical Therapy Treatment Patient Details Name: [name] MRN: [id number] DOB: April 10, 1931 Today's Date: 12/01/2016    History of Present Illness Pt L TKR and with hx of CAD, CABG, carotic stenosis and chronic pain syndrome    PT Comments    Pt continues motivated and progressing well with mobility.     Follow Up Recommendations  SNF     Equipment Recommendations  None recommended by PT    Recommendations for Other Services OT consult     Precautions / Restrictions Precautions Precautions: Knee;Fall Restrictions Weight Bearing Restrictions: No Other Position/Activity Restrictions: WBAT    Mobility  Bed Mobility Overal bed mobility: Needs Assistance Bed Mobility: Sit to Supine       Sit to supine: Min assist   General bed mobility comments: cues for sequence and use of R LE to self assist  Transfers Overall transfer level: Needs assistance Equipment used: Rolling walker (2 wheeled) Transfers: Sit to/from Stand Sit to Stand: Min assist         General transfer comment: cues for LE management and use of UEs to self assist  Ambulation/Gait Ambulation/Gait assistance: Min assist Ambulation Distance (Feet): 95 Feet Assistive device: Rolling walker (2 wheeled) Gait Pattern/deviations: Step-to pattern;Decreased step length - right;Decreased step length - left;Shuffle;Trunk flexed Gait velocity: decr Gait velocity interpretation: Below normal speed for age/gender General Gait Details: cues for posture, sequence and position from [employer]            Wheelchair Mobility    Modified Rankin (Stroke Patients Only)       Balance                                            Cognition Arousal/Alertness: Awake/alert Behavior During Therapy: WFL for tasks assessed/performed Overall Cognitive Status: Within Functional Limits for tasks assessed                                        Exercises Total Joint  Exercises Ankle Circles/Pumps: AROM;Both;15 reps;Supine Quad Sets: AROM;Both;10 reps;Supine Heel Slides: AAROM;Left;15 reps;Supine Straight Leg Raises: AAROM;AROM;Left;10 reps;Supine    General Comments        Pertinent Vitals/Pain Pain Assessment: 0-10 Pain Score: 5  Pain Location: L knee Pain Descriptors / Indicators: Aching;Sore Pain Intervention(s): Limited activity within patient's tolerance;Monitored during session;Premedicated before session;Ice applied    Home Living                      Prior Function            PT Goals (current goals can now be found in the care plan section) Acute Rehab PT Goals Patient Stated Goal: Rehab to regain IND PT Goal Formulation: With patient Time For Goal Achievement: 12/06/16 Potential to Achieve Goals: Good Progress towards PT goals: Progressing toward goals    Frequency    7X/week      PT Plan Current plan remains appropriate    Co-evaluation              AM-PAC PT "6 Clicks" Daily Activity  Outcome Measure  Difficulty turning over in bed (including adjusting bedclothes, sheets and blankets)?: Total Difficulty moving from lying on back to sitting on the side of the bed? : Total Difficulty sitting  down on and standing up from a chair with arms (e.g., wheelchair, bedside commode, etc,.)?: Total Help needed moving to and from a bed to chair (including a wheelchair)?: A Little Help needed walking in hospital room?: A Little Help needed climbing 3-5 steps with a railing? : A Lot 6 Click Score: 11    End of Session Equipment Utilized During Treatment: Gait belt Activity Tolerance: Patient tolerated treatment well Patient left: with call bell/phone within reach;with family/visitor present;in bed Nurse Communication: Mobility status PT Visit Diagnosis: Difficulty in walking, not elsewhere classified (R26.2)     Time: 1345-1426 PT Time Calculation (min) (ACUTE ONLY): 41 min  Charges:                        G Codes:       Pg [phone]    Cathan Gearin 12/01/2016, 7:16 AM

## 2016-12-01 NOTE — Discharge Summary (Signed)
Physician Discharge Summary  Patient ID: [name] MRN: [id number] DOB/AGE: 81-26-1932 81 y.o.  Admit date: 11/29/2016 Discharge date:  12/01/2016  Procedures:  Procedure(s) (LRB): LEFT TOTAL KNEE ARTHROPLASTY (Left)  Attending Physician:  Dr. Paralee Cancel   Admission Diagnoses:   Left knee primary OA / pain  Discharge Diagnoses:  Principal Problem:   S/P left TKA  Past Medical History:  Diagnosis Date  . Asthma    "i dont think i have asthma; i have no wheeaing at all; the Lord healed me of that "  . CAD (coronary artery disease)   . Carotid stenosis    40-59% bilateral  . Chronic pain syndrome   . Dysuria    difficult starting stream; prefers to have a foley cath for surgery and at least remain for 24 hours post -op   . GERD (gastroesophageal reflux disease)   . HLD (hyperlipidemia)   . HTN (hypertension)   . PONV (postoperative nausea and vomiting)    only after cataract surgery;  . Skin cancer     HPI:    [name], 83 y.o. female, has a history of pain and functional disability in the left knee due to arthritis and has failed non-surgical conservative treatments for greater than 12 weeks to includeNSAID's and/or analgesics, corticosteriod injections, use of assistive devices and activity modification.  Onset of symptoms was gradual, starting <1 year ago with rapidlly worsening course since that time. The patient noted no past surgery on the left knee(s).  Patient currently rates pain in the left knee(s) at 9 out of 10 with activity. Patient has worsening of pain with activity and weight bearing, pain that interferes with activities of daily living, pain with passive range of motion, crepitus and joint swelling.  Patient has evidence of periarticular osteophytes and joint space narrowing by imaging studies.  There is no active infection.  Risks, benefits and expectations were discussed with the patient.  Risks including but not limited to the risk of  anesthesia, blood clots, nerve damage, blood vessel damage, failure of the prosthesis, infection and up to and including death.  Patient understand the risks, benefits and expectations and wishes to proceed with surgery.   PCP: Shelly Shanks, MD   Discharged Condition: good  Hospital Course:  Patient underwent the above stated procedure on 11/29/2016. Patient tolerated the procedure well and brought to the recovery room in good condition and subsequently to the floor.  POD #1 BP: 138/45 ; Pulse: 66 ; Temp: 98.7 F (37.1 C) ; Resp: 16 Patient reports pain as mild, pain controlled. No events throughout the night.  Working well with PT. Dorsiflexion/plantar flexion intact, incision: dressing C/D/I, no cellulitis present and compartment soft.   LABS  Basename    HGB     9.8  HCT     28.6   POD #2  BP: 132/44 ; Pulse: 49 ; Temp: 97.5 F (36.4 C) ; Resp: 15 Patient reports pain as mild, pain controlled. No events throughout the night. Ready to be discharged skilled nursing facility. Dorsiflexion/plantar flexion intact, incision: dressing C/D/I, no cellulitis present and compartment soft.   LABS  Basename    HGB     9.3  HCT     26.7    Discharge Exam: General appearance: alert, cooperative and no distress Extremities: Homans sign is negative, no sign of DVT, no edema, redness or tenderness in the calves or thighs and no ulcers, gangrene or trophic changes  Disposition:  Skilled nursing facility with follow up in 2 weeks   Follow-up Information    Paralee Cancel, MD. Schedule an appointment as soon as possible for a visit in 2 week(s).   Specialty:  Orthopedic Surgery Contact information: [address] [place] [address] [phone]           Discharge Instructions    Call MD / Call 911    Complete by:  As directed    If you experience chest pain or shortness of breath, CALL 911 and be transported to the hospital emergency room.  If you develope a  fever above 101 F, pus (white drainage) or increased drainage or redness at the wound, or calf pain, call your surgeon's office.   Change dressing    Complete by:  As directed    Maintain surgical dressing until follow up in the clinic. If the edges start to pull up, may reinforce with tape. If the dressing is no longer working, may remove and cover with gauze and tape, but must keep the area dry and clean.  Call with any questions or concerns.   Constipation Prevention    Complete by:  As directed    Drink plenty of fluids.  Prune juice may be helpful.  You may use a stool softener, such as Colace (over the counter) 100 mg twice a day.  Use MiraLax (over the counter) for constipation as needed.   Diet - low sodium heart healthy    Complete by:  As directed    Discharge instructions    Complete by:  As directed    Maintain surgical dressing until follow up in the clinic. If the edges start to pull up, may reinforce with tape. If the dressing is no longer working, may remove and cover with gauze and tape, but must keep the area dry and clean.  Follow up in 2 weeks at [facility]. Call with any questions or concerns.   Increase activity slowly as tolerated    Complete by:  As directed    Weight bearing as tolerated with assist device (walker, cane, etc) as directed, use it as long as suggested by your surgeon or therapist, typically at least 4-6 weeks.   TED hose    Complete by:  As directed    Use stockings (TED hose) for 2 weeks on both leg(s).  You may remove them at night for sleeping.      Allergies as of 12/01/2016      Reactions   Lunesta [eszopiclone] Other (See Comments)   Didn't work   Ultram [tramadol] Itching   Lisinopril Other (See Comments)   leg weakness   Simvastatin Other (See Comments)   Pain and weakness in legs   Statins Other (See Comments)   (Causes weakness in legs and decreases ability to walk.  (lipitor crestor)      Medication List    STOP taking  these medications   aspirin EC 81 MG tablet Replaced by:  aspirin 81 MG chewable tablet   naproxen sodium 220 MG tablet Commonly known as:  ANAPROX   TYLENOL PO     TAKE these medications   albuterol 108 (90 Base) MCG/ACT inhaler Commonly known as:  PROVENTIL HFA;VENTOLIN HFA Inhale 2 puffs into the lungs every 6 (six) hours as needed for wheezing or shortness of breath.   amLODipine 10 MG tablet Commonly known as:  NORVASC Take 1 tablet (10 mg total) by mouth daily.   aspirin 81 MG  chewable tablet Chew 1 tablet (81 mg total) by mouth 2 (two) times daily. Take for 4 weeks, then resume regular dose. Replaces:  aspirin EC 81 MG tablet   CALCIUM 600/VITAMIN D 600-400 MG-UNIT tablet Generic drug:  Calcium Carbonate-Vitamin D Take 1 tablet by mouth 2 (two) times daily.   cholecalciferol 400 units Tabs tablet Commonly known as:  VITAMIN D Take 400 Units by mouth daily.   docusate sodium 100 MG capsule Commonly known as:  COLACE Take 1 capsule (100 mg total) by mouth 2 (two) times daily.   doxylamine (Sleep) 25 MG tablet Commonly known as:  UNISOM Take 25 mg by mouth at bedtime. [place] brand from [employer]   ferrous sulfate 325 (65 FE) MG tablet Take 1 tablet (325 mg total) by mouth 3 (three) times daily after meals.   Fish Oil 1000 MG Caps Take 2 capsules by mouth 2 (two) times daily.   fluticasone 50 MCG/ACT nasal spray Commonly known as:  FLONASE PLACE 2 SPRAYS INTO THE NOSE DAILY What changed:  See the new instructions.   guaiFENesin 600 MG 12 hr tablet Commonly known as:  MUCINEX Take 600 mg by mouth 2 (two) times daily.   HYDROcodone-acetaminophen 7.5-325 MG tablet Commonly known as:  NORCO Take 1-2 tablets by mouth every 4 (four) hours as needed for moderate pain.   hydroxypropyl methylcellulose / hypromellose 2.5 % ophthalmic solution Commonly known as:  ISOPTO TEARS / GONIOVISC Place 1 drop into both eyes as needed for dry eyes.   isosorbide mononitrate  60 MG 24 hr tablet Commonly known as:  IMDUR TAKE 1 TABLET (60 MG TOTAL) BY MOUTH DAILY. What changed:  how much to take  how to take this  when to take this  additional instructions   loratadine 10 MG tablet Commonly known as:  CLARITIN Take 10 mg by mouth daily.   MELATIN 3-1 MG Tabs Generic drug:  Melatonin-Pyridoxine Take 1 tablet by mouth at bedtime.   methocarbamol 500 MG tablet Commonly known as:  ROBAXIN Take 1 tablet (500 mg total) by mouth every 6 (six) hours as needed for muscle spasms.   metoprolol succinate 50 MG 24 hr tablet Commonly known as:  TOPROL-XL TAKE 1 TABLET (50 MG TOTAL) BY MOUTH DAILY. What changed:  how much to take  how to take this  when to take this  additional instructions   montelukast 10 MG tablet Commonly known as:  SINGULAIR TAKE 1 TABLET (10 MG TOTAL) BY MOUTH AT BEDTIME. What changed:  how much to take  how to take this  when to take this  additional instructions   multivitamin tablet Take 1 tablet by mouth 2 (two) times daily.   omeprazole 20 MG capsule Commonly known as:  PRILOSEC TAKE 1 CAPSULE (20 MG TOTAL) BY MOUTH 2 (TWO) TIMES DAILY BEFORE A MEAL. What changed:  See the new instructions.   polyethylene glycol packet Commonly known as:  MIRALAX / GLYCOLAX Take 17 g by mouth 2 (two) times daily.   potassium chloride 10 MEQ CR capsule Commonly known as:  MICRO-K TAKE 1 CAPSULE (10 MEQ TOTAL) BY MOUTH DAILY. What changed:  how much to take  how to take this  when to take this  additional instructions   vitamin C 1000 MG tablet Take 1,000 mg by mouth 2 (two) times daily.        Signed: [place]. Jamieson Hetland   PA-C  12/01/2016, 7:59 AM

## 2016-12-01 NOTE — Clinical Social Work Placement (Addendum)
   CLINICAL SOCIAL WORK PLACEMENT  NOTE  Date:  12/01/2016  Patient Details  Name: [name] MRN: [id number] Date of Birth: September 28, 1930  Clinical Social Work is seeking post-discharge placement for this patient at the [place] level of care (*CSW will initial, date and re-position this form in  chart as items are completed):  No   Patient/family provided with [place] Work Department's list of facilities offering this level of care within the geographic area requested by the patient (or if unable, by the patient's family).  Yes   Patient/family informed of their freedom to choose among providers that offer the needed level of care, that participate in Medicare, Medicaid or managed care program needed by the patient, have an available bed and are willing to accept the patient.  Yes   Patient/family informed of [place]'s ownership interest in [facility] and [facility], as well as of the fact that they are under no obligation to receive care at these facilities.  PASRR submitted to EDS on 11/30/16     PASRR number received on 11/30/16     Existing PASRR number confirmed on       FL2 transmitted to all facilities in geographic area requested by pt/family on 11/30/16     FL2 transmitted to all facilities within larger geographic area on       Patient informed that his/her managed care company has contracts with or will negotiate with certain facilities, including the following:        Yes   Patient/family informed of bed offers received.  Patient chooses bed at [facility] [place] recommends and patient chooses bed at      Patient to be transferred to [facility] [place] on 12/01/16.  Patient to be transferred to facility by [place]     Patient family notified on 12/01/16 of transfer.  Name of family member notified:  Daughter     PHYSICIAN       Additional Comment: Pt / daughter are in agreement with plan  for dc to [facility] today. PT approved transport by car. DC Summary sent to SNF for review. Scripts included in [employer]. # for report provided to nsg. DC packet provided to pt.   _______________________________________________ Luretha Rued, [place]  [phone] 12/01/2016, 2:21 PM

## 2016-12-01 NOTE — Op Note (Signed)
[name], [name] NO.:  [id number]  MEDICAL RECORD NO.:  [id number]  LOCATION:                                 FACILITY:  PHYSICIAN:  Pietro Cassis. Alvan Dame, M.D.       DATE OF BIRTH:  DATE OF PROCEDURE:  11/29/2016 DATE OF DISCHARGE:                              OPERATIVE REPORT   PREOPERATIVE DIAGNOSIS:  Left knee degenerative joint changes associated with unstable ligaments.  POSTOPERATIVE DIAGNOSIS/FINDINGS:  Severe degenerative changes to lateral compartment of her left knee with femoral condylar collapse and tibial plateau compression consistent with either chronic avascular changes with significant associated severe laxity to the medial collateral ligament.  PROCEDURE:  Left total knee arthroplasty.  COMPONENTS USED:  A size 2 TC3 femur, a size 2 MBT revision tray with a size 15 mm TC3 insert and then a 35 patella button.  SURGEON:  Pietro Cassis. Alvan Dame, M.D.  ASSISTANT:  Nehemiah Massed, PA-C.  Note that Ms. [name] was present for the entirety of the case from preoperative position, perioperative management of the operative extremity, general facilitation of the case, and primary wound closure.  ANESTHESIA:  Preoperative regional block plus spinal block.  SPECIMENS:  None.  DRAINS:  None.  COMPLICATIONS:  None.  BLOOD LOSS:  Probably less than 100 mL after the tourniquet was let down.  Tourniquet was utilized for 45 minutes at 250 mmHg.  INDICATIONS FOR PROCEDURE:  [name] is an 83 year old female with medical comorbidities, who was referred for surgical consideration, followed my partners, who initially seen her after a slip and fall.  It was at this point in time, that they recommend significant instability to her knee and was concerned about an MCL injury.  Radiographs revealed that she had some degenerative changes of the lateral side of joint with preserved spaces.  The question was how to proceed when I had a chance to reviewing with  [name] and her daughter current situation and correlations significantly affected by the condition of her knee.)  We had discussed the risk of her comorbidity states as well as her postoperative course and she despite all of this discussion was very emotional about trying to remain as active as possible in the remaining years.  Consent was obtained for benefit of pain relief.  Preoperative planning did include the consideration of more constrained knee replacement.  PROCEDURE IN DETAIL:  The patient was brought to the operative theater. Once adequate anesthesia, preoperative antibiotics, Ancef administered, she was positioned supine.  1 g of tranexamic acid and Decadron also administered.  The left thigh tourniquet was placed.  The left lower extremity was then prepped and draped in sterile fashion.  Time-out was performed identifying the patient, planned procedure, and extremity.  Leg was exsanguinated.  Tourniquet elevated to 250 mmHg.  A DeMayo leg holder was utilized.  A midline incision was made, followed by soft tissue dissection.  Median arthrotomy was made encountering clear synovial fluid.  Once I got to the joint, we identified the significant changes to lateral femoral condyle with significant compression, which was chronic in nature as well as laterally.  The notation of chronicity was that this was not  acute avascular bent.  The bone was fairly sclerotic in these areas.  Given these findings, we clearly understood the challenges that she was experiencing beforehand.  At this point, attention was first directed to patella.  The patella was debrided.  The precut measure was noted to be 21 mm.  I resected down about 13 mm, used 35 patellar button to restore patellar height.  Lug holes were drilled, and a metal shield was placed to protect the patella from retractors and saw blades for remainder of the case.  Attention was first directed to the femur.  At this  point, the femoral canal was opened with a drill, irrigated to try to prevent fat emboli. I then used the intramedullary rod at 5 degrees of valgus, based on her deformity laterally.  10 mm of bone resected off the distal femur for use of a Sigma knee replacement system.  This cut did remove distal bone.  There was not a gap here that required no distal augment laterally.  There were deficiency noted on the posterior lateral femoral condyle, but this was not something that required augmentation with cement augmentation of spine.  Once the distal femoral cut was made, I subluxated the tibia anteriorly. With the retractors placed and further debridement was carried out that made a cut off the proximal medial tibia 6 mm.  Given the significant deformity laterally.  Once this cut was made, I actually had cut down to a lower level of the lateral tibial plateau that no augments were necessary.  Due to the pseudolaxity and the chronicity of the medial base, I did select to use an MBT revision tray, so I could utilize a TC3 component here.  The tibia was then measured to be a size 2, drilled and prepared for an MBT revision tray.  At this point, following this cut and preparation confirming a perpendicular line, we attended to the femoral component.  The femur was then finally prepared for size 2 femur using TC3 component here.  Following femoral preparation, trial reduction was carried out with a 2 femur, 2 tibia, and a 15 mm insert.  With this, the knee came to full extension and flexed fully.  The knee ligaments were slightly to lax medially with a posterior stabilized trial implant versus selection for TC3.  Once the trial reduction was carried out, all trial components were removed.  I drilled the sclerotic bone of the distal lateral femur and proximal lateral tibial plateau.  Cement was mixed and final components were opened.  The final components were cemented into the knee.   The knee was brought to extension with 15 mm insert and held in extension until cement cured.  Once the cement had cured, the excess cement was removed throughout the knee.  The final 15 mm TC3 insert was selected and placed into the tibia.  The knee was irrigated throughout the case and again at this point.  The tourniquet was let down.  Tourniquet was let down after 45 minutes without significant hemostasis required.  The extensor mechanism was then reapproximated using #1 Vicryl and 0 V-Loc suture.  The remaining wound was closed with 2-0 Vicryl and running 4-0 Monocryl.  The knee was then cleaned, dried, and dressed sterilely using surgical glue and Aquacel dressing.  She was brought to the recovery room in stable condition, tolerating the procedure well and findings were reviewed with her family.     Pietro Cassis Alvan Dame, M.D.     MDO/MEDQ  D:  12/01/2016  T:  12/01/2016  Job:  [id number]

## 2016-12-01 NOTE — Progress Notes (Signed)
Physical Therapy Treatment Patient Details Name: [name] MRN: [id number] DOB: [birth year] Today's Date: 12/01/2016    History of Present Illness Pt L TKR and with hx of CAD, CABG, carotic stenosis and chronic pain syndrome    PT Comments    Pt very motivated and progressing well with mobility.  Follow Up Recommendations  SNF     Equipment Recommendations  None recommended by PT    Recommendations for Other Services OT consult     Precautions / Restrictions Precautions Precautions: Knee;Fall Restrictions Weight Bearing Restrictions: No Other Position/Activity Restrictions: WBAT    Mobility  Bed Mobility Overal bed mobility: Needs Assistance Bed Mobility: Supine to Sit     Supine to sit: Min guard     General bed mobility comments: cues for sequence and use of R LE to self assist  Transfers Overall transfer level: Needs assistance Equipment used: Rolling walker (2 wheeled) Transfers: Sit to/from Stand Sit to Stand: Min guard         General transfer comment: cues for LE management and use of UEs to self assist  Ambulation/Gait Ambulation/Gait assistance: Min assist;Min guard Ambulation Distance (Feet): 150 Feet Assistive device: Rolling walker (2 wheeled) Gait Pattern/deviations: Decreased step length - right;Decreased step length - left;Shuffle;Trunk flexed;Step-to pattern;Step-through pattern Gait velocity: decr Gait velocity interpretation: Below normal speed for age/gender General Gait Details: cues for posture, sequence and position from [employer]            Wheelchair Mobility    Modified Rankin (Stroke Patients Only)       Balance                                            Cognition Arousal/Alertness: Awake/alert Behavior During Therapy: WFL for tasks assessed/performed Overall Cognitive Status: Within Functional Limits for tasks assessed                                         Exercises Total Joint Exercises Ankle Circles/Pumps: AROM;Both;15 reps;Supine Quad Sets: AROM;Both;Supine;15 reps Heel Slides: AAROM;Left;Supine;20 reps Straight Leg Raises: AAROM;AROM;Left;Supine;20 reps Goniometric ROM: AAROM L knee -8 - 80    General Comments        Pertinent Vitals/Pain Pain Assessment: 0-10 Pain Score: 4  Pain Location: L knee Pain Descriptors / Indicators: Aching;Sore Pain Intervention(s): Limited activity within patient's tolerance;Monitored during session;Premedicated before session;Ice applied    Home Living                      Prior Function            PT Goals (current goals can now be found in the care plan section) Acute Rehab PT Goals Patient Stated Goal: Rehab to regain IND PT Goal Formulation: With patient Time For Goal Achievement: 12/06/16 Potential to Achieve Goals: Good Progress towards PT goals: Progressing toward goals    Frequency    7X/week      PT Plan Current plan remains appropriate    Co-evaluation              AM-PAC PT "6 Clicks" Daily Activity  Outcome Measure  Difficulty turning over in bed (including adjusting bedclothes, sheets and blankets)?: Total Difficulty moving from lying on back to sitting on the side  of the bed? : Total Difficulty sitting down on and standing up from a chair with arms (e.g., wheelchair, bedside commode, etc,.)?: Total Help needed moving to and from a bed to chair (including a wheelchair)?: A Little Help needed walking in hospital room?: A Little Help needed climbing 3-5 steps with a railing? : A Little 6 Click Score: 12    End of Session Equipment Utilized During Treatment: Gait belt Activity Tolerance: Patient tolerated treatment well Patient left: with call bell/phone within reach;with family/visitor present;in bed Nurse Communication: Mobility status PT Visit Diagnosis: Difficulty in walking, not elsewhere classified (R26.2)     Time: 2409-7353 PT Time  Calculation (min) (ACUTE ONLY): 26 min  Charges:  $Gait Training: 8-22 mins $Therapeutic Exercise: 8-22 mins                    G Codes:       Pg [phone]    [name] 12/01/2016, 12:57 PM

## 2016-12-01 NOTE — Progress Notes (Signed)
RN called report to Katie at [facility]. All questions answered.   Daughter transported patient to facility. Paperwork sent with patient for facility.   NT rolled patient down with all belongings to family car.

## 2016-12-01 NOTE — Brief Op Note (Signed)
11/29/2016  4:45 PM  PATIENT:  [name]  83 y.o. female  PRE-OPERATIVE DIAGNOSIS:  Left knee osteoarthritis   POST-OPERATIVE DIAGNOSIS:  Left knee osteoarthritis   PROCEDURE:  Procedure(s) with comments: LEFT TOTAL KNEE ARTHROPLASTY (Left) -   SURGEON:  Surgeon(s) and Role:    Paralee Cancel, MD - Primary  PHYSICIAN ASSISTANT: Nehemiah Massed, PA-C  ANESTHESIA:   regional and spinal  EBL:  Total I/O In: 120 [P.O.:120] Out: 700 [Urine:700]  BLOOD ADMINISTERED:none  DRAINS: none   LOCAL MEDICATIONS USED:  MARCAINE     SPECIMEN:  No Specimen  DISPOSITION OF SPECIMEN:  N/A  COUNTS:  YES  TOURNIQUET:   Total Tourniquet Time Documented: Thigh (Left) - 45 minutes Total: Thigh (Left) - 45 minutes   DICTATION: .Other Dictation: Dictation Number [id number]  PLAN OF CARE: Admit to inpatient   PATIENT DISPOSITION:  PACU - hemodynamically stable.   Delay start of Pharmacological VTE agent (>24hrs) due to surgical blood loss or risk of bleeding: no

## 2016-12-01 NOTE — Progress Notes (Signed)
Plan for d/c to SNF, discharge planning per CSW. [phone] 

## 2016-12-01 NOTE — Progress Notes (Signed)
     Subjective: 2 Days Post-Op Procedure(s) (LRB): LEFT TOTAL KNEE ARTHROPLASTY (Left)   Patient reports pain as mild, pain controlled. No events throughout the night. Ready to be discharged skilled nursing facility.  Objective:   VITALS:   Vitals:   11/30/16 2111 12/01/16 0522  BP: (!) 120/41 (!) 132/44  Pulse: 64 (!) 49  Resp: 15 15  Temp: 98.3 F (36.8 C) (!) 97.5 F (36.4 C)    Dorsiflexion/Plantar flexion intact Incision: dressing C/D/I No cellulitis present Compartment soft  LABS  Recent Labs  11/30/16 0552 12/01/16 0538  HGB 9.8* 9.3*  HCT 28.6* 26.7*  WBC 8.0 9.9  PLT 200 176     Recent Labs  11/30/16 0552 12/01/16 0538  NA 138 137  K 3.8 4.3  BUN 20 25*  CREATININE 0.95 0.81  GLUCOSE 180* 109*     Assessment/Plan: 2 Days Post-Op Procedure(s) (LRB): LEFT TOTAL KNEE ARTHROPLASTY (Left) Up with therapy Discharge to SNF  Follow up in 2 weeks at [facility]. Follow up with OLIN,Atheena Spano D in 2 weeks.  Contact information:  [facility] [address], [address] [place]chanz   PAC  12/01/2016, 7:52 AM

## 2016-12-02 ENCOUNTER — Telehealth: Payer: Self-pay

## 2016-12-02 DIAGNOSIS — M1712 Unilateral primary osteoarthritis, left knee: Secondary | ICD-10-CM | POA: Diagnosis not present

## 2016-12-02 DIAGNOSIS — D509 Iron deficiency anemia, unspecified: Secondary | ICD-10-CM | POA: Diagnosis not present

## 2016-12-02 DIAGNOSIS — I2510 Atherosclerotic heart disease of native coronary artery without angina pectoris: Secondary | ICD-10-CM | POA: Diagnosis not present

## 2016-12-02 DIAGNOSIS — J449 Chronic obstructive pulmonary disease, unspecified: Secondary | ICD-10-CM | POA: Diagnosis not present

## 2016-12-02 DIAGNOSIS — I00 Rheumatic fever without heart involvement: Secondary | ICD-10-CM | POA: Diagnosis not present

## 2016-12-02 NOTE — Telephone Encounter (Signed)
Pt's daughter Manuela Schwartz (dpr) called on behalf of patient requesting to switch from MR to VS.  Pt's daughter made aware of office protocol.    MR/VS please advise if you're ok with pt switching to VS.  Thanks!

## 2016-12-09 DIAGNOSIS — M25662 Stiffness of left knee, not elsewhere classified: Secondary | ICD-10-CM | POA: Diagnosis not present

## 2016-12-11 NOTE — Telephone Encounter (Signed)
I am okay with switch. 

## 2016-12-12 NOTE — Telephone Encounter (Signed)
It is fine to switch to mysself - MR  Dr. Brand Males, M.D., [facility].C.P Pulmonary and Critical Care Medicine Staff Physician [place] [facility] Pager: [phone], If no answer or between  15:00h - 7:00h: call [phone]  12/12/2016 6:00 PM

## 2016-12-12 NOTE — Telephone Encounter (Signed)
MR please advise. Thanks! 

## 2016-12-13 NOTE — Telephone Encounter (Signed)
lmtcb X1 for pt's daughter to schedule with MR>

## 2016-12-14 DIAGNOSIS — Z471 Aftercare following joint replacement surgery: Secondary | ICD-10-CM | POA: Diagnosis not present

## 2016-12-14 DIAGNOSIS — Z96652 Presence of left artificial knee joint: Secondary | ICD-10-CM | POA: Diagnosis not present

## 2016-12-14 DIAGNOSIS — M25662 Stiffness of left knee, not elsewhere classified: Secondary | ICD-10-CM | POA: Diagnosis not present

## 2016-12-15 ENCOUNTER — Ambulatory Visit: Payer: Medicare Other | Admitting: Internal Medicine

## 2016-12-15 NOTE — Telephone Encounter (Signed)
Patient's daughter returning phone call.

## 2016-12-15 NOTE — Telephone Encounter (Signed)
I have spoken with pt's daughter, Manuela Schwartz. Manuela Schwartz states pt wishes to switch from MR to VS.   MR are you okay with this switch?

## 2016-12-15 NOTE — Telephone Encounter (Signed)
lmtcb x2 for pt's daughter. 

## 2016-12-16 DIAGNOSIS — M25662 Stiffness of left knee, not elsewhere classified: Secondary | ICD-10-CM | POA: Diagnosis not present

## 2016-12-16 NOTE — Telephone Encounter (Signed)
I know pt from visit on 11/23/16 so 15 min slot is okay.

## 2016-12-16 NOTE — Telephone Encounter (Signed)
Ok to switch to VS  Dr. Brand Males, M.D., [facility]C.P Pulmonary and Critical Care Medicine Staff Physician [place] [facility] Pager: [phone], If no answer or between  15:00h - 7:00h: call [phone]  12/16/2016 1:03 PM

## 2016-12-16 NOTE — Telephone Encounter (Signed)
Spoke with [name]. Pt has been scheduled for OV with VS on 03/22/17 @ 11:15. Nothing further needed.

## 2016-12-16 NOTE — Telephone Encounter (Signed)
Called and spoke with pt's daughter to get pt scheduled with VS. There are no 30min slots available as far as the schedule is open, however there are multiple 78min slots. Pt was scheduled for f/u with MR on 03/14/17. Manuela Schwartz would like for pt to be scheduled with VS around this time.   VS please advise. Thanks.

## 2016-12-20 ENCOUNTER — Other Ambulatory Visit: Payer: Self-pay | Admitting: Family

## 2016-12-21 DIAGNOSIS — M25662 Stiffness of left knee, not elsewhere classified: Secondary | ICD-10-CM | POA: Diagnosis not present

## 2016-12-23 DIAGNOSIS — M25662 Stiffness of left knee, not elsewhere classified: Secondary | ICD-10-CM | POA: Diagnosis not present

## 2016-12-28 DIAGNOSIS — M25662 Stiffness of left knee, not elsewhere classified: Secondary | ICD-10-CM | POA: Diagnosis not present

## 2016-12-30 ENCOUNTER — Other Ambulatory Visit: Payer: Self-pay | Admitting: Family

## 2016-12-30 DIAGNOSIS — M25662 Stiffness of left knee, not elsewhere classified: Secondary | ICD-10-CM | POA: Diagnosis not present

## 2016-12-30 NOTE — Telephone Encounter (Signed)
t seen 04/22/16  [name]

## 2017-01-04 DIAGNOSIS — M25662 Stiffness of left knee, not elsewhere classified: Secondary | ICD-10-CM | POA: Diagnosis not present

## 2017-01-06 DIAGNOSIS — M25662 Stiffness of left knee, not elsewhere classified: Secondary | ICD-10-CM | POA: Diagnosis not present

## 2017-01-09 DIAGNOSIS — Z9981 Dependence on supplemental oxygen: Secondary | ICD-10-CM | POA: Diagnosis not present

## 2017-01-09 DIAGNOSIS — Z5181 Encounter for therapeutic drug level monitoring: Secondary | ICD-10-CM | POA: Diagnosis not present

## 2017-01-09 DIAGNOSIS — M170 Bilateral primary osteoarthritis of knee: Secondary | ICD-10-CM | POA: Diagnosis not present

## 2017-01-09 DIAGNOSIS — J4541 Moderate persistent asthma with (acute) exacerbation: Secondary | ICD-10-CM | POA: Diagnosis not present

## 2017-01-09 DIAGNOSIS — R0989 Other specified symptoms and signs involving the circulatory and respiratory systems: Secondary | ICD-10-CM | POA: Diagnosis not present

## 2017-01-09 DIAGNOSIS — I10 Essential (primary) hypertension: Secondary | ICD-10-CM | POA: Diagnosis not present

## 2017-01-09 DIAGNOSIS — I2510 Atherosclerotic heart disease of native coronary artery without angina pectoris: Secondary | ICD-10-CM | POA: Diagnosis not present

## 2017-01-09 DIAGNOSIS — R0902 Hypoxemia: Secondary | ICD-10-CM | POA: Diagnosis not present

## 2017-01-09 DIAGNOSIS — Z96652 Presence of left artificial knee joint: Secondary | ICD-10-CM | POA: Diagnosis not present

## 2017-01-09 DIAGNOSIS — M75122 Complete rotator cuff tear or rupture of left shoulder, not specified as traumatic: Secondary | ICD-10-CM | POA: Diagnosis not present

## 2017-01-09 DIAGNOSIS — J84112 Idiopathic pulmonary fibrosis: Secondary | ICD-10-CM | POA: Diagnosis not present

## 2017-01-11 DIAGNOSIS — M25662 Stiffness of left knee, not elsewhere classified: Secondary | ICD-10-CM | POA: Diagnosis not present

## 2017-01-16 DIAGNOSIS — Z471 Aftercare following joint replacement surgery: Secondary | ICD-10-CM | POA: Diagnosis not present

## 2017-01-16 DIAGNOSIS — M7061 Trochanteric bursitis, right hip: Secondary | ICD-10-CM | POA: Diagnosis not present

## 2017-01-16 DIAGNOSIS — Z96652 Presence of left artificial knee joint: Secondary | ICD-10-CM | POA: Diagnosis not present

## 2017-02-07 ENCOUNTER — Other Ambulatory Visit: Payer: Self-pay | Admitting: Family

## 2017-02-11 ENCOUNTER — Other Ambulatory Visit: Payer: Self-pay | Admitting: Family

## 2017-02-12 ENCOUNTER — Other Ambulatory Visit: Payer: Self-pay | Admitting: Family

## 2017-02-21 ENCOUNTER — Other Ambulatory Visit: Payer: Self-pay | Admitting: Family

## 2017-03-03 DIAGNOSIS — R0989 Other specified symptoms and signs involving the circulatory and respiratory systems: Secondary | ICD-10-CM | POA: Diagnosis not present

## 2017-03-03 DIAGNOSIS — I2510 Atherosclerotic heart disease of native coronary artery without angina pectoris: Secondary | ICD-10-CM | POA: Diagnosis not present

## 2017-03-03 DIAGNOSIS — Z23 Encounter for immunization: Secondary | ICD-10-CM | POA: Diagnosis not present

## 2017-03-03 DIAGNOSIS — G5621 Lesion of ulnar nerve, right upper limb: Secondary | ICD-10-CM | POA: Diagnosis not present

## 2017-03-03 DIAGNOSIS — E871 Hypo-osmolality and hyponatremia: Secondary | ICD-10-CM | POA: Diagnosis not present

## 2017-03-03 DIAGNOSIS — N8110 Cystocele, unspecified: Secondary | ICD-10-CM | POA: Diagnosis not present

## 2017-03-03 DIAGNOSIS — R351 Nocturia: Secondary | ICD-10-CM | POA: Diagnosis not present

## 2017-03-03 DIAGNOSIS — I10 Essential (primary) hypertension: Secondary | ICD-10-CM | POA: Diagnosis not present

## 2017-03-03 DIAGNOSIS — K5901 Slow transit constipation: Secondary | ICD-10-CM | POA: Diagnosis not present

## 2017-03-03 DIAGNOSIS — J84112 Idiopathic pulmonary fibrosis: Secondary | ICD-10-CM | POA: Diagnosis not present

## 2017-03-03 DIAGNOSIS — R739 Hyperglycemia, unspecified: Secondary | ICD-10-CM | POA: Diagnosis not present

## 2017-03-03 DIAGNOSIS — Z9981 Dependence on supplemental oxygen: Secondary | ICD-10-CM | POA: Diagnosis not present

## 2017-03-03 DIAGNOSIS — R7301 Impaired fasting glucose: Secondary | ICD-10-CM | POA: Diagnosis not present

## 2017-03-08 DIAGNOSIS — Z96652 Presence of left artificial knee joint: Secondary | ICD-10-CM | POA: Diagnosis not present

## 2017-03-08 DIAGNOSIS — Z471 Aftercare following joint replacement surgery: Secondary | ICD-10-CM | POA: Diagnosis not present

## 2017-03-08 DIAGNOSIS — M25551 Pain in right hip: Secondary | ICD-10-CM | POA: Diagnosis not present

## 2017-03-08 DIAGNOSIS — M7061 Trochanteric bursitis, right hip: Secondary | ICD-10-CM | POA: Diagnosis not present

## 2017-03-09 DIAGNOSIS — N814 Uterovaginal prolapse, unspecified: Secondary | ICD-10-CM | POA: Diagnosis not present

## 2017-03-09 DIAGNOSIS — R339 Retention of urine, unspecified: Secondary | ICD-10-CM | POA: Diagnosis not present

## 2017-03-09 DIAGNOSIS — R351 Nocturia: Secondary | ICD-10-CM | POA: Diagnosis not present

## 2017-03-14 ENCOUNTER — Ambulatory Visit: Payer: Medicare Other | Admitting: Internal Medicine

## 2017-03-20 ENCOUNTER — Other Ambulatory Visit: Payer: Self-pay | Admitting: Family

## 2017-03-22 ENCOUNTER — Encounter: Payer: Self-pay | Admitting: Pulmonary Disease

## 2017-03-22 ENCOUNTER — Ambulatory Visit (INDEPENDENT_AMBULATORY_CARE_PROVIDER_SITE_OTHER): Payer: Medicare Other | Admitting: Pulmonary Disease

## 2017-03-22 VITALS — BP 118/70 | HR 71 | Ht 61.0 in | Wt 117.6 lb

## 2017-03-22 DIAGNOSIS — J8410 Pulmonary fibrosis, unspecified: Secondary | ICD-10-CM | POA: Diagnosis not present

## 2017-03-22 DIAGNOSIS — I6523 Occlusion and stenosis of bilateral carotid arteries: Secondary | ICD-10-CM | POA: Diagnosis not present

## 2017-03-22 DIAGNOSIS — J432 Centrilobular emphysema: Secondary | ICD-10-CM | POA: Diagnosis not present

## 2017-03-22 NOTE — Progress Notes (Signed)
Current Outpatient Medications on File Prior to Visit  Medication Sig  . amLODipine (NORVASC) 10 MG tablet Take 1 tablet (10 mg total) by mouth daily.  . Ascorbic Acid (VITAMIN C) 1000 MG tablet Take 1,000 mg by mouth 2 (two) times daily.  . Calcium Carbonate-Vitamin D (CALCIUM 600/VITAMIN D) 600-400 MG-UNIT per tablet Take 1 tablet by mouth 2 (two) times daily.    . cholecalciferol (VITAMIN D) 400 units TABS tablet Take 400 Units by mouth daily.  Marland Kitchen docusate sodium (COLACE) 100 MG capsule Take 1 capsule (100 mg total) by mouth 2 (two) times daily.  Marland Kitchen doxylamine, Sleep, (UNISOM) 25 MG tablet Take 25 mg by mouth at bedtime. [place] brand from [employer]  . ferrous sulfate 325 (65 FE) MG tablet Take 1 tablet (325 mg total) by mouth 3 (three) times daily after meals.  . fluticasone (FLONASE) 50 MCG/ACT nasal spray PLACE 2 SPRAYS INTO THE NOSE DAILY  . guaiFENesin (MUCINEX) 600 MG 12 hr tablet Take 600 mg by mouth 2 (two) times daily.   . hydroxypropyl methylcellulose / hypromellose (ISOPTO TEARS / GONIOVISC) 2.5 % ophthalmic solution Place 1 drop into both eyes as needed for dry eyes.  . isosorbide mononitrate (IMDUR) 60 MG 24 hr tablet TAKE 1 TABLET (60 MG TOTAL) BY MOUTH DAILY. (Patient taking differently: Take 60 mg by mouth daily. )  . loratadine (CLARITIN) 10 MG tablet Take 10 mg by mouth daily.    . Melatonin-Pyridoxine (MELATIN) 3-1 MG TABS Take 1 tablet by mouth at bedtime.   . metoprolol succinate (TOPROL-XL) 50 MG 24 hr tablet TAKE 1 TABLET (50 MG TOTAL) BY MOUTH DAILY. (Patient taking differently: Take 50 mg by mouth daily. )  . Multiple Vitamin (MULTIVITAMIN) tablet Take 1 tablet by mouth 2 (two) times daily.   . Omega-3 Fatty Acids (FISH OIL) 1000 MG CAPS Take 2 capsules by mouth 2 (two) times daily.    Marland Kitchen omeprazole (PRILOSEC) 20 MG capsule TAKE 1 CAPSULE (20 MG TOTAL) BY MOUTH 2 (TWO) TIMES DAILY BEFORE A MEAL.  Marland Kitchen polyethylene glycol (MIRALAX / GLYCOLAX) packet Take 17 g by mouth 2 (two)  times daily.  . potassium chloride (MICRO-K) 10 MEQ CR capsule TAKE 1 CAPSULE (10 MEQ TOTAL) BY MOUTH DAILY. (Patient taking differently: Take 10 mEq by mouth daily. )  . albuterol (PROVENTIL HFA;VENTOLIN HFA) 108 (90 Base) MCG/ACT inhaler Inhale 2 puffs into the lungs every 6 (six) hours as needed for wheezing or shortness of breath. (Patient not taking: Reported on 03/22/2017)   No current facility-administered medications on file prior to visit.     Chief Complaint  Patient presents with  . Follow-up    Pt is requesting to see if she could be off of the O2 during the day as she is staying above 90%, she is able to do more daily activities around the house. Walks 1/4 mile per day without O2 on with a cane.    Pulmonary tests Pulmonary function test 11/14/12 >> FEV1 1.56(95%), FEV1% 81, TLC 3.49 (75%), DLCO 55% Alpha 1 AT 09/02/16 >> 155, MM Serology 09/02/16 >> ACE 35, anti DS DNA 1, RF 33, anti CCP < 16, SSA/SSB < 1, Scl 70 < 1, RNP Ab < 0.2, Aldolase 2.8, HP panel negative High resolution CT chest 09/08/16 >> widespread septal thickening and subpleural reticulation, minimal peripheral GGO, mild traction BTX more in lower lobes, possible early honeycombing, air trapping, moderate centrilobular and paraseptal emphysema  Past medical history CAD s/p CABG, Chronic  pain, GERD, HTN, HLD  Past surgical history, Family history, Social history, Allergies reviewed  Vital signs BP 118/70 (BP Location: Left Arm, Cuff Size: Normal)   Pulse 71   Ht 5\' 1"  (1.549 m)   Wt 117 lb 9.6 oz (53.3 kg)   SpO2 95%   BMI 22.22 kg/m   History of Present Illness [name] is a 83 y.o. female former smoker with pulmonary fibrosis and emphysema.  She had Lt TKR in August 2018.  She has recovered well.  She can walk to her mailbox and back with a cane (about 1/4 mile).  She checks her pulse ox at home and doesn't go below 90%.  She is still using oxygen at night.  She is worried about how she will  travel with oxygen set up.  She is not having cough, wheeze, sputum, fever, or hemoptysis.  She maintained SpO2 > 90% on room air with ambulation today.   Physical Exam  General - pleasant Eyes - pupils reactive ENT - no sinus tenderness, no oral exudate, no LAN Cardiac - regular, no murmur Chest - no wheeze, rales Abd - soft, non tender Ext - no edema Skin - no rashes Neuro - normal strength Psych - normal mood   Assessment/Plan  Pulmonary fibrosis with NSIP pattern on CT chest. - no symptoms - monitor clinically  Emphysema. - no symptoms - monitor clinically  Allergic rhinitis. - prn flonase and claritin - d/c singulair  Hypoxia after hospitalization for influenza in January 2018. - her oxygen level on room air at rest and exertion today was okay - will arrange for ONO with RA and then determine if she can have home oxygen set up d/c'ed   Patient Instructions  Can stop singulair You don't need to use oxygen during the day anymore Will arrange for overnight oxygen test  Follow up in 6 months    Chesley Mires, MD [place] [facility]/Sleep Pager:  [phone] 03/22/2017, 11:58 AM

## 2017-03-22 NOTE — Patient Instructions (Addendum)
Can stop singulair You don't need to use oxygen during the day anymore Will arrange for overnight oxygen test  Follow up in 6 months

## 2017-03-27 DIAGNOSIS — R339 Retention of urine, unspecified: Secondary | ICD-10-CM | POA: Diagnosis not present

## 2017-03-27 DIAGNOSIS — N399 Disorder of urinary system, unspecified: Secondary | ICD-10-CM | POA: Diagnosis not present

## 2017-03-27 DIAGNOSIS — Z466 Encounter for fitting and adjustment of urinary device: Secondary | ICD-10-CM | POA: Diagnosis not present

## 2017-03-27 DIAGNOSIS — N813 Complete uterovaginal prolapse: Secondary | ICD-10-CM | POA: Diagnosis not present

## 2017-03-27 DIAGNOSIS — N952 Postmenopausal atrophic vaginitis: Secondary | ICD-10-CM | POA: Diagnosis not present

## 2017-03-27 DIAGNOSIS — R351 Nocturia: Secondary | ICD-10-CM | POA: Diagnosis not present

## 2017-03-27 DIAGNOSIS — R159 Full incontinence of feces: Secondary | ICD-10-CM | POA: Diagnosis not present

## 2017-03-28 ENCOUNTER — Encounter: Payer: Self-pay | Admitting: Pulmonary Disease

## 2017-03-28 DIAGNOSIS — R0902 Hypoxemia: Secondary | ICD-10-CM | POA: Diagnosis not present

## 2017-03-28 DIAGNOSIS — J449 Chronic obstructive pulmonary disease, unspecified: Secondary | ICD-10-CM | POA: Diagnosis not present

## 2017-03-30 ENCOUNTER — Telehealth: Payer: Self-pay | Admitting: Pulmonary Disease

## 2017-03-30 DIAGNOSIS — J9621 Acute and chronic respiratory failure with hypoxia: Secondary | ICD-10-CM

## 2017-03-30 NOTE — Telephone Encounter (Signed)
Ok, can we can send this order to that DME company in [place] for temporary oxygen while pt is there for 1 week? VS please advise.

## 2017-03-30 NOTE — Telephone Encounter (Signed)
Called and spoke with patient today regarding results per vs.  Informed the patient of her results today and recommendations per vs. The patient verbalized understanding and denied any questions or concerns at this time.  Pt is concerned that they are leaving for travels for the holidays. Advised her and her daughter to call [facility] letting them know that pt is needing O2 at night and will need for there overnight travels for over 7 days. Pt agreed that she will call the [facility] office today. Nothing further needed.

## 2017-03-30 NOTE — Telephone Encounter (Signed)
ONO with RA 03/28/17 >> test time 10 hrs 40 sec.  Average SpO2 89%, low SpO2 76%.  Spent 4 hrs 10 min with SpO2 < 88%.   Please let her know that her oxygen level is still low at night.  She should continue using supplemental oxygen at night.

## 2017-03-31 NOTE — Telephone Encounter (Signed)
Please send order

## 2017-04-05 NOTE — Telephone Encounter (Signed)
Order placed.  Pt's daughter aware.  Nothing further needed.

## 2017-04-17 DIAGNOSIS — R350 Frequency of micturition: Secondary | ICD-10-CM | POA: Diagnosis not present

## 2017-04-17 DIAGNOSIS — N390 Urinary tract infection, site not specified: Secondary | ICD-10-CM | POA: Diagnosis not present

## 2017-04-17 DIAGNOSIS — R339 Retention of urine, unspecified: Secondary | ICD-10-CM | POA: Diagnosis not present

## 2017-05-01 DIAGNOSIS — N813 Complete uterovaginal prolapse: Secondary | ICD-10-CM | POA: Diagnosis not present

## 2017-05-01 DIAGNOSIS — R159 Full incontinence of feces: Secondary | ICD-10-CM | POA: Diagnosis not present

## 2017-05-01 DIAGNOSIS — R339 Retention of urine, unspecified: Secondary | ICD-10-CM | POA: Diagnosis not present

## 2017-05-01 DIAGNOSIS — Z466 Encounter for fitting and adjustment of urinary device: Secondary | ICD-10-CM | POA: Diagnosis not present

## 2017-05-01 DIAGNOSIS — N398 Other specified disorders of urinary system: Secondary | ICD-10-CM | POA: Diagnosis not present

## 2017-05-01 DIAGNOSIS — N952 Postmenopausal atrophic vaginitis: Secondary | ICD-10-CM | POA: Diagnosis not present

## 2017-05-31 DIAGNOSIS — J209 Acute bronchitis, unspecified: Secondary | ICD-10-CM | POA: Diagnosis not present

## 2017-06-12 DIAGNOSIS — R159 Full incontinence of feces: Secondary | ICD-10-CM | POA: Diagnosis not present

## 2017-06-12 DIAGNOSIS — R351 Nocturia: Secondary | ICD-10-CM | POA: Diagnosis not present

## 2017-06-12 DIAGNOSIS — R39198 Other difficulties with micturition: Secondary | ICD-10-CM | POA: Diagnosis not present

## 2017-06-12 DIAGNOSIS — R8290 Unspecified abnormal findings in urine: Secondary | ICD-10-CM | POA: Diagnosis not present

## 2017-06-12 DIAGNOSIS — N993 Prolapse of vaginal vault after hysterectomy: Secondary | ICD-10-CM | POA: Diagnosis not present

## 2017-06-12 DIAGNOSIS — N952 Postmenopausal atrophic vaginitis: Secondary | ICD-10-CM | POA: Diagnosis not present

## 2017-06-27 ENCOUNTER — Other Ambulatory Visit: Payer: Self-pay | Admitting: Family

## 2017-07-04 DIAGNOSIS — R0989 Other specified symptoms and signs involving the circulatory and respiratory systems: Secondary | ICD-10-CM | POA: Diagnosis not present

## 2017-07-04 DIAGNOSIS — N8110 Cystocele, unspecified: Secondary | ICD-10-CM | POA: Diagnosis not present

## 2017-07-04 DIAGNOSIS — J84112 Idiopathic pulmonary fibrosis: Secondary | ICD-10-CM | POA: Diagnosis not present

## 2017-07-04 DIAGNOSIS — G5621 Lesion of ulnar nerve, right upper limb: Secondary | ICD-10-CM | POA: Diagnosis not present

## 2017-07-04 DIAGNOSIS — K5901 Slow transit constipation: Secondary | ICD-10-CM | POA: Diagnosis not present

## 2017-07-04 DIAGNOSIS — R351 Nocturia: Secondary | ICD-10-CM | POA: Diagnosis not present

## 2017-07-04 DIAGNOSIS — F5101 Primary insomnia: Secondary | ICD-10-CM | POA: Diagnosis not present

## 2017-07-04 DIAGNOSIS — I10 Essential (primary) hypertension: Secondary | ICD-10-CM | POA: Diagnosis not present

## 2017-07-04 DIAGNOSIS — R739 Hyperglycemia, unspecified: Secondary | ICD-10-CM | POA: Diagnosis not present

## 2017-07-04 DIAGNOSIS — E871 Hypo-osmolality and hyponatremia: Secondary | ICD-10-CM | POA: Diagnosis not present

## 2017-07-04 DIAGNOSIS — I2510 Atherosclerotic heart disease of native coronary artery without angina pectoris: Secondary | ICD-10-CM | POA: Diagnosis not present

## 2017-07-04 DIAGNOSIS — Z9981 Dependence on supplemental oxygen: Secondary | ICD-10-CM | POA: Diagnosis not present

## 2017-07-12 ENCOUNTER — Other Ambulatory Visit: Payer: Self-pay | Admitting: Family

## 2017-10-03 DIAGNOSIS — N8110 Cystocele, unspecified: Secondary | ICD-10-CM | POA: Diagnosis not present

## 2017-10-03 DIAGNOSIS — Z9981 Dependence on supplemental oxygen: Secondary | ICD-10-CM | POA: Diagnosis not present

## 2017-10-03 DIAGNOSIS — R0989 Other specified symptoms and signs involving the circulatory and respiratory systems: Secondary | ICD-10-CM | POA: Diagnosis not present

## 2017-10-03 DIAGNOSIS — I2510 Atherosclerotic heart disease of native coronary artery without angina pectoris: Secondary | ICD-10-CM | POA: Diagnosis not present

## 2017-10-03 DIAGNOSIS — E871 Hypo-osmolality and hyponatremia: Secondary | ICD-10-CM | POA: Diagnosis not present

## 2017-10-03 DIAGNOSIS — I10 Essential (primary) hypertension: Secondary | ICD-10-CM | POA: Diagnosis not present

## 2017-10-03 DIAGNOSIS — K5901 Slow transit constipation: Secondary | ICD-10-CM | POA: Diagnosis not present

## 2017-10-03 DIAGNOSIS — R413 Other amnesia: Secondary | ICD-10-CM | POA: Diagnosis not present

## 2017-10-03 DIAGNOSIS — J84112 Idiopathic pulmonary fibrosis: Secondary | ICD-10-CM | POA: Diagnosis not present

## 2017-10-03 DIAGNOSIS — R739 Hyperglycemia, unspecified: Secondary | ICD-10-CM | POA: Diagnosis not present

## 2017-11-27 DIAGNOSIS — N399 Disorder of urinary system, unspecified: Secondary | ICD-10-CM | POA: Diagnosis not present

## 2017-11-27 DIAGNOSIS — Z4689 Encounter for fitting and adjustment of other specified devices: Secondary | ICD-10-CM | POA: Diagnosis not present

## 2017-11-27 DIAGNOSIS — N952 Postmenopausal atrophic vaginitis: Secondary | ICD-10-CM | POA: Diagnosis not present

## 2017-11-27 DIAGNOSIS — Z79890 Hormone replacement therapy (postmenopausal): Secondary | ICD-10-CM | POA: Diagnosis not present

## 2017-11-27 DIAGNOSIS — R159 Full incontinence of feces: Secondary | ICD-10-CM | POA: Diagnosis not present

## 2017-11-27 DIAGNOSIS — R351 Nocturia: Secondary | ICD-10-CM | POA: Diagnosis not present

## 2017-11-27 DIAGNOSIS — N993 Prolapse of vaginal vault after hysterectomy: Secondary | ICD-10-CM | POA: Diagnosis not present

## 2017-12-11 DIAGNOSIS — N952 Postmenopausal atrophic vaginitis: Secondary | ICD-10-CM | POA: Diagnosis not present

## 2017-12-11 DIAGNOSIS — R159 Full incontinence of feces: Secondary | ICD-10-CM | POA: Diagnosis not present

## 2017-12-11 DIAGNOSIS — N399 Disorder of urinary system, unspecified: Secondary | ICD-10-CM | POA: Diagnosis not present

## 2017-12-11 DIAGNOSIS — N993 Prolapse of vaginal vault after hysterectomy: Secondary | ICD-10-CM | POA: Diagnosis not present

## 2017-12-11 DIAGNOSIS — R339 Retention of urine, unspecified: Secondary | ICD-10-CM | POA: Diagnosis not present

## 2017-12-11 DIAGNOSIS — R351 Nocturia: Secondary | ICD-10-CM | POA: Diagnosis not present

## 2017-12-13 DIAGNOSIS — M720 Palmar fascial fibromatosis [Dupuytren]: Secondary | ICD-10-CM | POA: Diagnosis not present

## 2017-12-13 DIAGNOSIS — I2510 Atherosclerotic heart disease of native coronary artery without angina pectoris: Secondary | ICD-10-CM | POA: Diagnosis not present

## 2017-12-13 DIAGNOSIS — E871 Hypo-osmolality and hyponatremia: Secondary | ICD-10-CM | POA: Diagnosis not present

## 2017-12-13 DIAGNOSIS — I10 Essential (primary) hypertension: Secondary | ICD-10-CM | POA: Diagnosis not present

## 2017-12-13 DIAGNOSIS — R413 Other amnesia: Secondary | ICD-10-CM | POA: Diagnosis not present

## 2017-12-13 DIAGNOSIS — R0989 Other specified symptoms and signs involving the circulatory and respiratory systems: Secondary | ICD-10-CM | POA: Diagnosis not present

## 2017-12-13 DIAGNOSIS — R739 Hyperglycemia, unspecified: Secondary | ICD-10-CM | POA: Diagnosis not present

## 2017-12-13 DIAGNOSIS — N8110 Cystocele, unspecified: Secondary | ICD-10-CM | POA: Diagnosis not present

## 2017-12-13 DIAGNOSIS — Z9981 Dependence on supplemental oxygen: Secondary | ICD-10-CM | POA: Diagnosis not present

## 2017-12-13 DIAGNOSIS — J84112 Idiopathic pulmonary fibrosis: Secondary | ICD-10-CM | POA: Diagnosis not present

## 2017-12-13 DIAGNOSIS — K5901 Slow transit constipation: Secondary | ICD-10-CM | POA: Diagnosis not present

## 2018-01-09 IMAGING — MR MR SHOULDER*L* WO/W CM
4 of 9 series · 18 of 40 positions shown · IV contrast (multihance)
Comparison: Chest x-ray 08/21/2012

CLINICAL DATA: Pt has had chronic shoulder pain but said it has
gotten much worse here lately.

EXAM:
MRI OF THE LEFT SHOULDER WITHOUT AND WITH CONTRAST
TECHNIQUE: Multiplanar, multisequence MR imaging of the LEFT shoulder was
performed before and after the administration of intravenous
contrast.
CONTRAST:  10mL MULTIHANCE GADOBENATE DIMEGLUMINE 529 MG/ML IV SOLN

[Series 4: T2 fat-sat · axial · 4.0mm · 0.29mm/px · z∈[-86,+19]mm · 5 of 22 slices shown (1 of 3)]
[im 1/22]
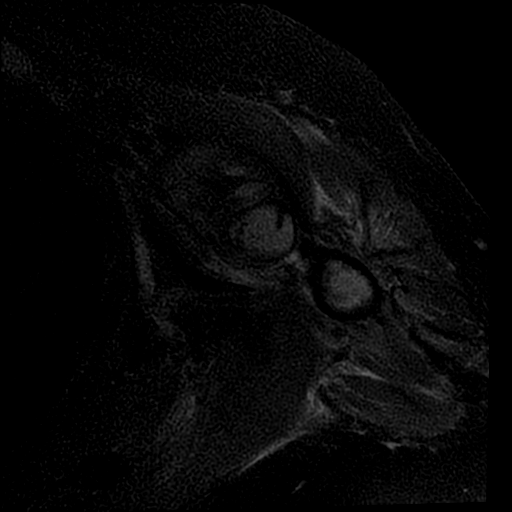
[im 6/22]
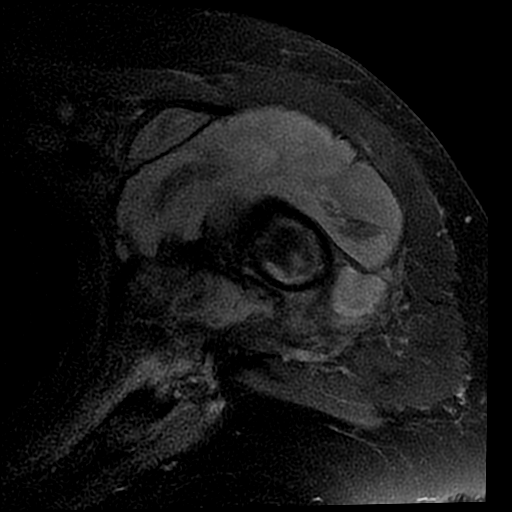
[im 11/22]
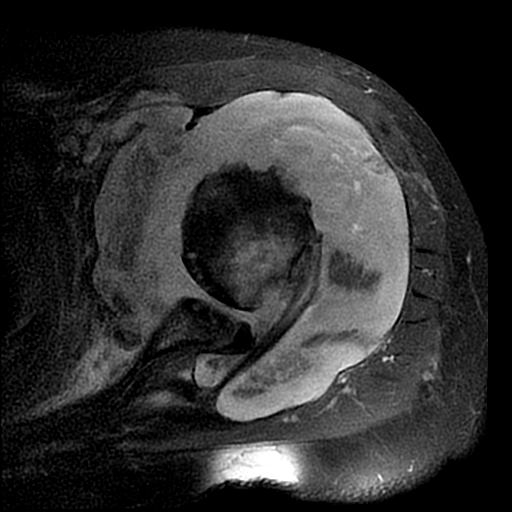
[im 16/22]
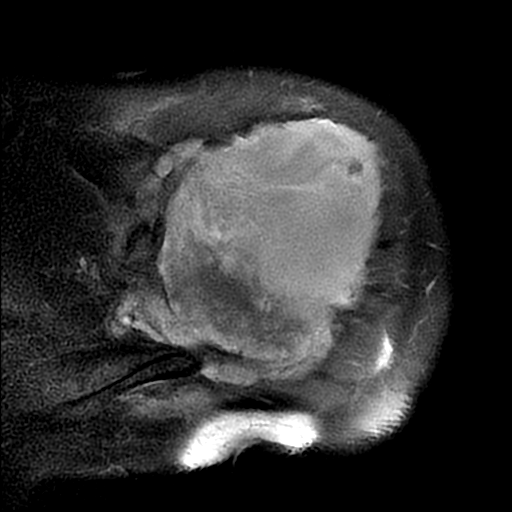
[im 22/22]
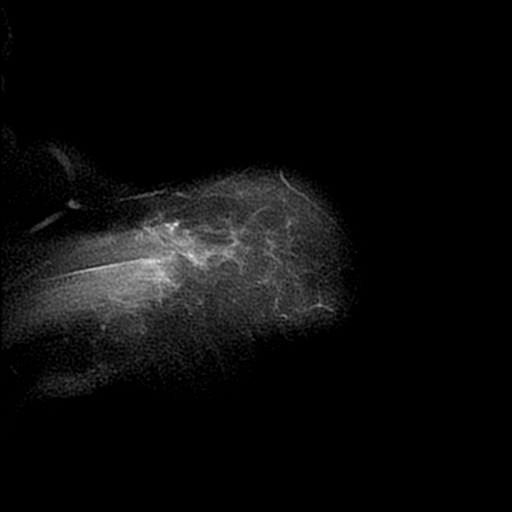

[Series 5: PD · coronal · 4.0mm · 0.29mm/px · 5 of 22 slices shown]
[im 1/22]
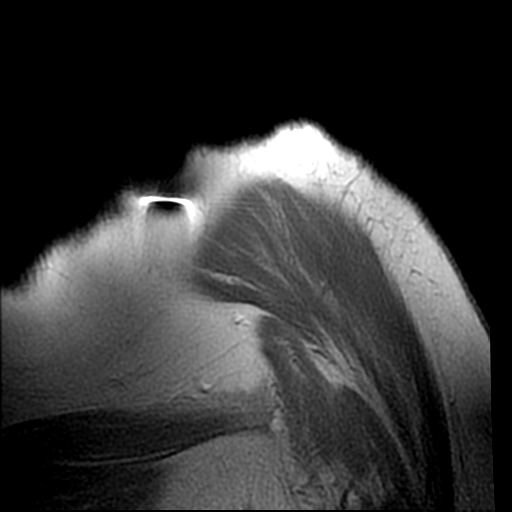
[im 6/22]
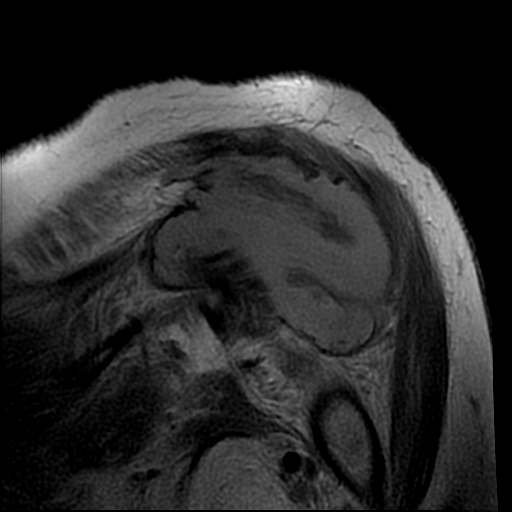
[im 11/22]
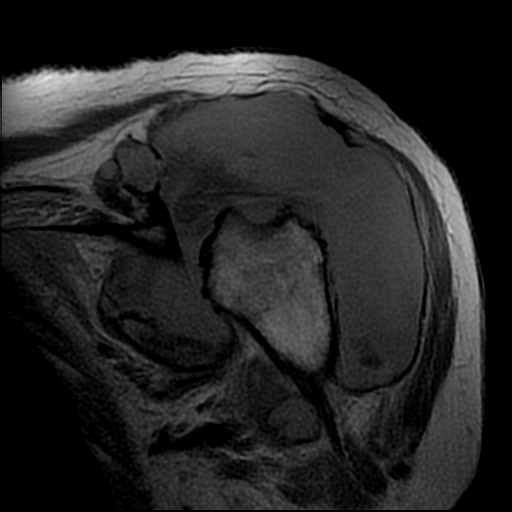
[im 16/22]
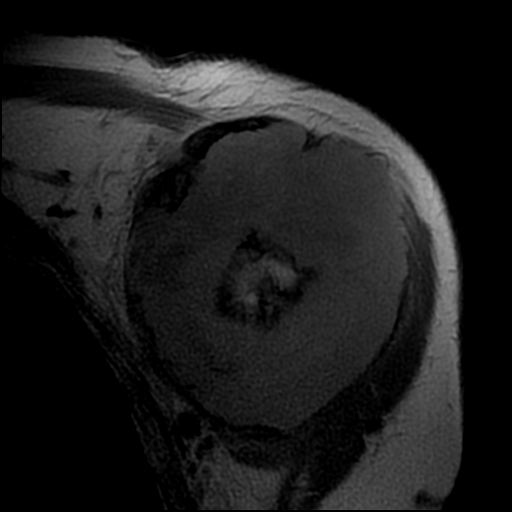
[im 22/22]
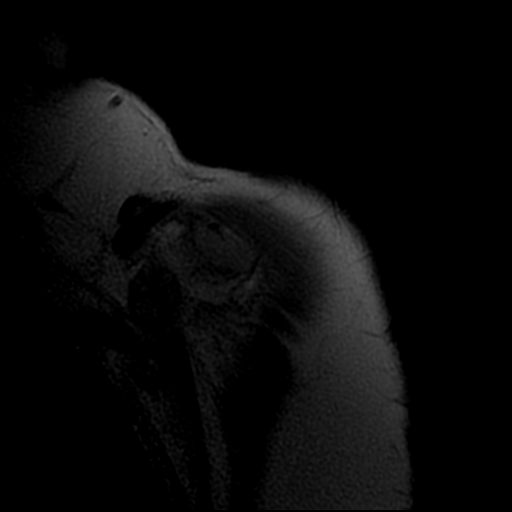

[Series 6: T2 fat-sat · coronal · 4.0mm · 0.29mm/px · 4 of 22 slices shown (2 of 3)]
[im 1/22]
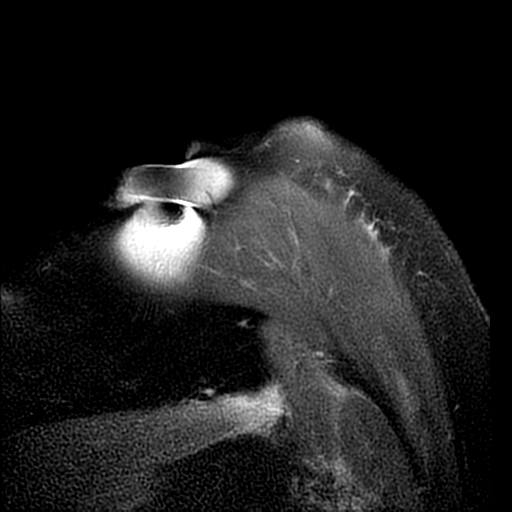
[im 8/22]
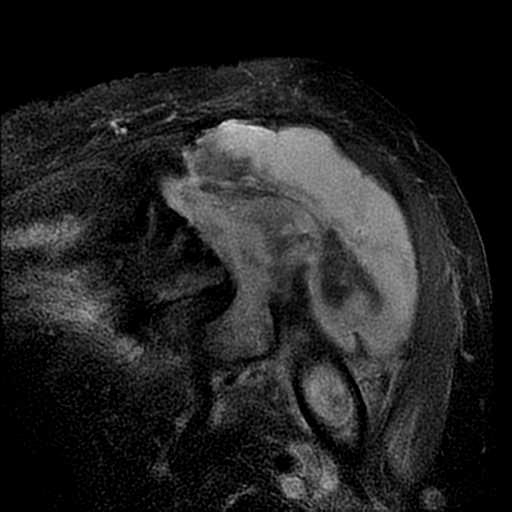
[im 15/22]
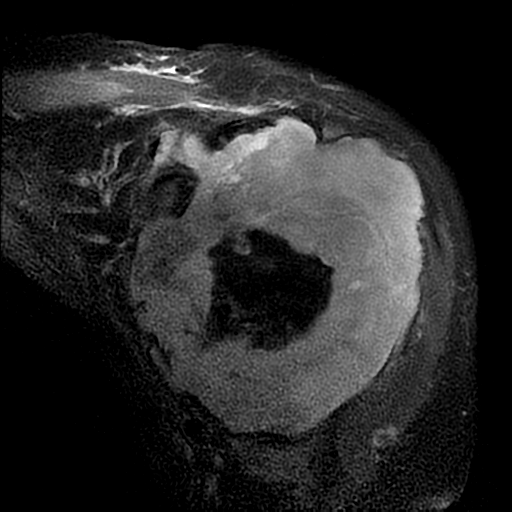
[im 22/22]
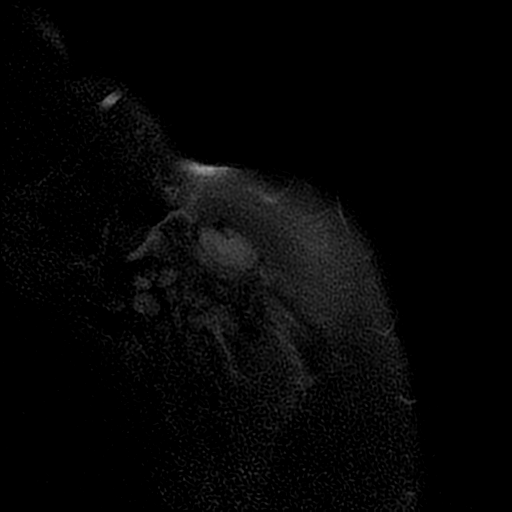

[Series 7: T2 fat-sat · sagittal · 4.0mm · 0.29mm/px · 4 of 27 slices shown (3 of 3)]
[im 1/27]
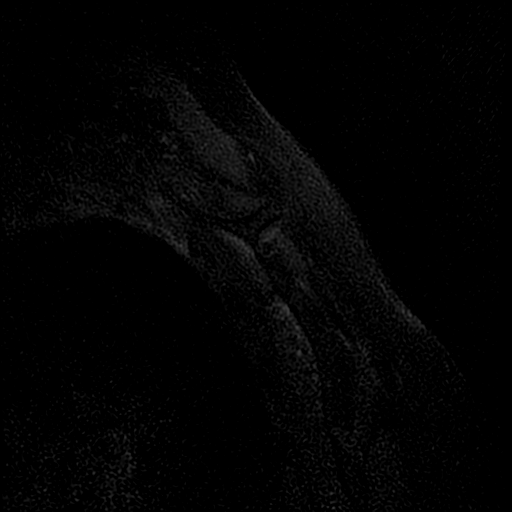
[im 7/27]
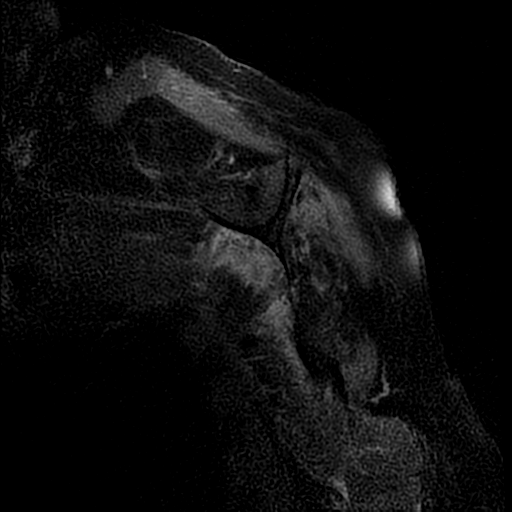
[im 14/27]
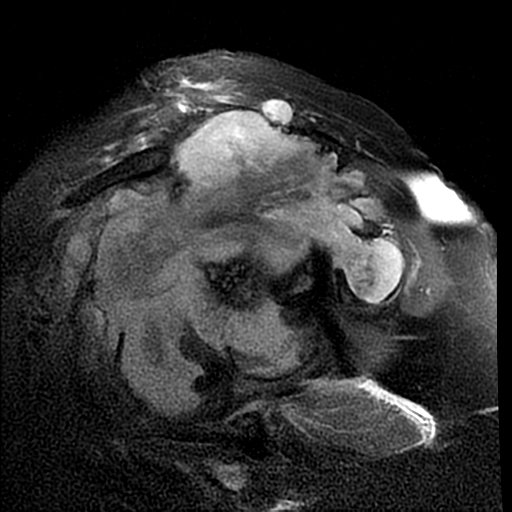
[im 27/27]
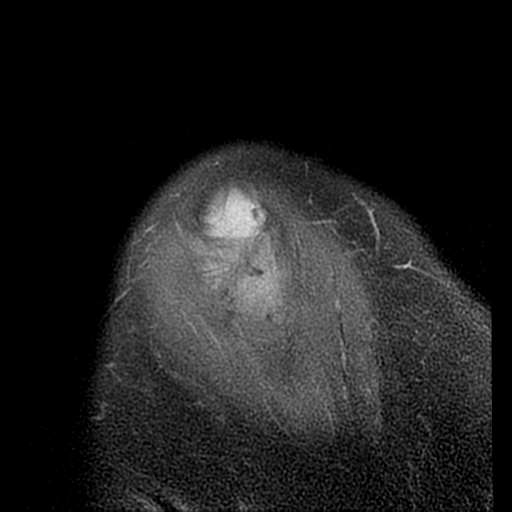

[18 of 40 positions shown; findings below may reference images not displayed]

FINDINGS: Patient motion degrades image quality limiting evaluation.

Rotator cuff: Complete chronic tear of the supraspinatus,
infraspinatus and subscapularis tendons with 5.4 cm of retraction.
Intact teres minor.

Muscles: Muscle atrophy of the supraspinatus, infraspinatus and
subscapularis muscles.

Biceps long head: Complete tear of the long head of the biceps
tendon.

Acromioclavicular Joint: Erosion of the distal clavicle. Chronic
severe widening of the acromioclavicular joint.

Glenohumeral Joint: Large joint effusion with small areas of T2
hyperintense signal within the fluid and thin peripheral synovial
enhancement. Extensive full-thickness cartilage loss of the glenoid
and humeral head.

Labrum:  Diffuse labral degeneration.

Bones: Inferior pseudosubluxation of the humeral head relative to
the glenoid secondary to large amount of joint fluid. Extensive
erosive changes of the humeral head with osteolysis. Erosive changes
of the anterior half of the glenoid. Abnormal concavity of the
superior posterior humeral head which can be seen with a Hill-Sachs
lesion secondary to prior anterior shoulder dislocation versus
severe erosion with subcortical reactive marrow edema.
IMPRESSION: 1. Large joint effusion with small areas of T2 hyperintense signal
within the fluid and thin peripheral synovial enhancement. Findings
are concerning for hemarthrosis.
2. Inferior pseudosubluxation of the humeral head relative to the
glenoid secondary to large amount of joint fluid. Extensive erosive
changes of the distal clavicle, humeral head and anterior half of
the glenoid. This appearance can be seen with inflammatory or
crystalline arthropathy. Septic arthritis is considered less likely.
Arthrocentesis of the joint is recommended.
3. Severe osteoarthritis of the left glenohumeral joint.
4. Complete chronic tear of the supraspinatus, infraspinatus and
subscapularis tendons with 5.4 cm of retraction.
5. Complete tear of the intraarticular portion of the long head of
the biceps tendon.

## 2018-02-07 DIAGNOSIS — Z23 Encounter for immunization: Secondary | ICD-10-CM | POA: Diagnosis not present

## 2018-03-27 NOTE — Progress Notes (Signed)
Cardiology Office Note   Date:  03/29/2018   ID:  [name], DOB 07/05/1930, MRN [id number]  PCP:  Vernie Shanks, MD  Cardiologist:   No primary care provider on file.   Chief Complaint  Patient presents with  . Coronary Artery Disease      History of Present Illness: [name] is a 84 y.o. female who presents for follow-up of coronary disease..  She has a history of CAD with CABG.   In 2010 she had severe three-vessel disease noted on cardiac catheterization post bypass. 2 out of the 3 grafts were occluded SVG to PDA and SVG to diagonal occluded.Marland Kitchen LIMA to LAD was widely patent. EF was normal at that time.  I saw her at that time.  She last saw Dr. Marlou Porch in 2018 preop prior to knee surgery. She had a perfusion study.  This was intermediate risk and he had a fixed small anterior defect and probably mild inferior ischemia.  She was managed medically.    Since she had her knee surgery she is done quite well.  She finished physical therapy.  She occasionally gets some twinges of some discomfort in her chest but nothing other than some fleeting problems.  She does have pulmonary fibrosis but only uses oxygen at night.  She is actually done better from a breathing standpoint.  She does some exercises pedaling of machine and also walks with a cane.  Past Medical History:  Diagnosis Date  . Asthma    "i dont think i have asthma; i have no wheeaing at all; the Lord healed me of that "  . CAD (coronary artery disease)   . Carotid stenosis    40-59% bilateral  . Chronic pain syndrome   . Dysuria    difficult starting stream; prefers to have a foley cath for surgery and at least remain for 24 hours post -op   . GERD (gastroesophageal reflux disease)   . HLD (hyperlipidemia)   . HTN (hypertension)   . PONV (postoperative nausea and vomiting)    only after cataract surgery;  . Skin cancer     Past Surgical History:  Procedure Laterality Date  . ABDOMINAL HYSTERECTOMY   1974  . baldder resuspension  1988  . CARDIAC CATHETERIZATION     x3   . CORONARY ARTERY BYPASS GRAFT  1996   LIMA to LAD, SVG to PDA and SVG to D1   . CORONARY ARTERY BYPASS GRAFT  2010   cath 2 vessel CAD with 2 of 3 bypass grafts occluded. had a patent LIMA to LAD  . skin cancer resected    . TONSILLECTOMY    . TOTAL KNEE ARTHROPLASTY Left 11/29/2016   Procedure: LEFT TOTAL KNEE ARTHROPLASTY;  Surgeon: Paralee Cancel, MD;  Location: [facility];  Service: Orthopedics;  Laterality: Left;  Adductor Block     Current Outpatient Medications  Medication Sig Dispense Refill  . amLODipine (NORVASC) 10 MG tablet Take 1 tablet (10 mg total) by mouth daily. 90 tablet 2  . Ascorbic Acid (VITAMIN C) 1000 MG tablet Take 1,000 mg by mouth 2 (two) times daily.    Marland Kitchen aspirin EC 81 MG tablet Take 81 mg by mouth daily.    . Calcium Carbonate-Vitamin D (CALCIUM 600/VITAMIN D) 600-400 MG-UNIT per tablet Take 1 tablet by mouth 2 (two) times daily.      . cholecalciferol (VITAMIN D) 400 units TABS tablet Take 400 Units by mouth daily.    Marland Kitchen  fluticasone (FLONASE) 50 MCG/ACT nasal spray PLACE 2 SPRAYS INTO THE NOSE DAILY 48 g 0  . hydroxypropyl methylcellulose / hypromellose (ISOPTO TEARS / GONIOVISC) 2.5 % ophthalmic solution Place 1 drop into both eyes as needed for dry eyes.    . isosorbide mononitrate (IMDUR) 60 MG 24 hr tablet TAKE 1 TABLET (60 MG TOTAL) BY MOUTH DAILY. (Patient taking differently: Take 60 mg by mouth daily. ) 90 tablet 2  . loratadine (CLARITIN) 10 MG tablet Take 10 mg by mouth daily.      . Melatonin-Pyridoxine (MELATIN) 3-1 MG TABS Take 1 tablet by mouth at bedtime.     . metoprolol succinate (TOPROL-XL) 50 MG 24 hr tablet TAKE 1 TABLET (50 MG TOTAL) BY MOUTH DAILY. (Patient taking differently: Take 50 mg by mouth daily. ) 90 tablet 2  . Multiple Vitamin (MULTIVITAMIN) tablet Take 1 tablet by mouth 2 (two) times daily.     . Omega-3 Fatty Acids (FISH OIL) 1000 MG CAPS Take 2 capsules by mouth  2 (two) times daily.      Marland Kitchen omeprazole (PRILOSEC) 40 MG capsule Take 40 mg by mouth daily.    . potassium chloride (MICRO-K) 10 MEQ CR capsule TAKE 1 CAPSULE (10 MEQ TOTAL) BY MOUTH DAILY. (Patient taking differently: Take 10 mEq by mouth daily. ) 90 capsule 2   No current facility-administered medications for this visit.     Allergies:   Lunesta [eszopiclone]; Ultram [tramadol]; Lisinopril; Simvastatin; and Statins     ROS:  Please see the history of present illness.   Otherwise, review of systems are positive for none.   All other systems are reviewed and negative.    PHYSICAL EXAM: VS:  BP 140/60 (BP Location: Left Arm, Patient Position: Sitting, Cuff Size: Normal)   Pulse 67   Ht 5\' 1"  (1.549 m)   Wt 131 lb (59.4 kg)   BMI 24.75 kg/m  , BMI Body mass index is 24.75 kg/m. GENERAL:  Well appearing NECK:  No jugular venous distention, waveform within normal limits, carotid upstroke brisk and symmetric, right carotid bruits, no thyromegaly LUNGS: Bilateral fine crackles one third of the way up CHEST:  Unremarkable HEART:  PMI not displaced or sustained,S1 and S2 within normal limits, no S3, no S4, no clicks, no rubs, no murmurs ABD:  Flat, positive bowel sounds normal in frequency in pitch, no bruits, no rebound, no guarding, no midline pulsatile mass, no hepatomegaly, no splenomegaly EXT:  2 plus pulses throughout, no edema, no cyanosis no clubbing    EKG:  EKG is ordered today. The ekg ordered today demonstrates sinus rhythm, rate 67, axis within normal limits, intervals within normal limits, baseline artifact, no acute ST-T wave changes.   Recent Labs: No results found for requested labs within last 8760 hours.    Lipid Panel    Component Value Date/Time   CHOL 333 (H) 08/15/2013 1138   CHOL 307 (H) 08/21/2012 1427   TRIG 137 08/15/2013 1138   TRIG 142 08/21/2012 1427   HDL 75 08/15/2013 1138   HDL 80 08/21/2012 1427   CHOLHDL 7 02/26/2009 0944   VLDL 20.6  02/26/2009 0944   LDLCALC 231 (H) 08/15/2013 1138   LDLCALC 199 (H) 08/21/2012 1427   LDLDIRECT 238.0 02/26/2009 0944      Wt Readings from Last 3 Encounters:  03/29/18 131 lb (59.4 kg)  03/22/17 117 lb 9.6 oz (53.3 kg)  11/29/16 115 lb (52.2 kg)      Other studies Reviewed:  Additional studies/ records that were reviewed today include: Office labs . Review of the above records demonstrates:  Please see elsewhere in the note.     ASSESSMENT AND PLAN:  CAD: The patient has had no symptoms.  She has lower risk perfusion study last year.  She will continue with risk reduction.  CAROTID STENOSIS: This was mild.  No change in therapy.  DYSLIPIDEMIA: She does not take a statin.  She does not have follow-up of her lipids.  No change in therapy.   Current medicines are reviewed at length with the patient today.  The patient does not have concerns regarding medicines.  The following changes have been made:  no change  Labs/ tests ordered today include: None  Orders Placed This Encounter  Procedures  . EKG 12-Lead     Disposition:   FU with in one year.     Signed, Minus Breeding, MD  03/29/2018 2:30 PM    [place]

## 2018-03-29 ENCOUNTER — Encounter: Payer: Self-pay | Admitting: Cardiology

## 2018-03-29 ENCOUNTER — Ambulatory Visit (INDEPENDENT_AMBULATORY_CARE_PROVIDER_SITE_OTHER): Payer: Medicare Other | Admitting: Cardiology

## 2018-03-29 VITALS — BP 140/60 | HR 67 | Ht 61.0 in | Wt 131.0 lb

## 2018-03-29 DIAGNOSIS — I2510 Atherosclerotic heart disease of native coronary artery without angina pectoris: Secondary | ICD-10-CM | POA: Diagnosis not present

## 2018-03-29 DIAGNOSIS — I10 Essential (primary) hypertension: Secondary | ICD-10-CM

## 2018-03-29 DIAGNOSIS — E785 Hyperlipidemia, unspecified: Secondary | ICD-10-CM

## 2018-03-29 NOTE — Patient Instructions (Signed)

## 2018-04-03 DIAGNOSIS — Z23 Encounter for immunization: Secondary | ICD-10-CM | POA: Diagnosis not present

## 2018-04-03 DIAGNOSIS — I10 Essential (primary) hypertension: Secondary | ICD-10-CM | POA: Diagnosis not present

## 2018-04-03 DIAGNOSIS — R0989 Other specified symptoms and signs involving the circulatory and respiratory systems: Secondary | ICD-10-CM | POA: Diagnosis not present

## 2018-04-03 DIAGNOSIS — H6122 Impacted cerumen, left ear: Secondary | ICD-10-CM | POA: Diagnosis not present

## 2018-04-03 DIAGNOSIS — K5901 Slow transit constipation: Secondary | ICD-10-CM | POA: Diagnosis not present

## 2018-04-03 DIAGNOSIS — I2510 Atherosclerotic heart disease of native coronary artery without angina pectoris: Secondary | ICD-10-CM | POA: Diagnosis not present

## 2018-04-03 DIAGNOSIS — E871 Hypo-osmolality and hyponatremia: Secondary | ICD-10-CM | POA: Diagnosis not present

## 2018-04-03 DIAGNOSIS — M720 Palmar fascial fibromatosis [Dupuytren]: Secondary | ICD-10-CM | POA: Diagnosis not present

## 2018-04-03 DIAGNOSIS — J84112 Idiopathic pulmonary fibrosis: Secondary | ICD-10-CM | POA: Diagnosis not present

## 2018-04-03 DIAGNOSIS — R413 Other amnesia: Secondary | ICD-10-CM | POA: Diagnosis not present

## 2018-04-03 DIAGNOSIS — R739 Hyperglycemia, unspecified: Secondary | ICD-10-CM | POA: Diagnosis not present

## 2018-04-03 DIAGNOSIS — Z9981 Dependence on supplemental oxygen: Secondary | ICD-10-CM | POA: Diagnosis not present

## 2018-04-09 ENCOUNTER — Ambulatory Visit: Payer: Medicare Other | Admitting: Cardiology

## 2018-05-01 DIAGNOSIS — Z4689 Encounter for fitting and adjustment of other specified devices: Secondary | ICD-10-CM | POA: Diagnosis not present

## 2018-05-01 DIAGNOSIS — R351 Nocturia: Secondary | ICD-10-CM | POA: Diagnosis not present

## 2018-05-01 DIAGNOSIS — R339 Retention of urine, unspecified: Secondary | ICD-10-CM | POA: Diagnosis not present

## 2018-05-01 DIAGNOSIS — N993 Prolapse of vaginal vault after hysterectomy: Secondary | ICD-10-CM | POA: Diagnosis not present

## 2018-05-01 DIAGNOSIS — N398 Other specified disorders of urinary system: Secondary | ICD-10-CM | POA: Diagnosis not present

## 2018-05-01 DIAGNOSIS — R8290 Unspecified abnormal findings in urine: Secondary | ICD-10-CM | POA: Diagnosis not present

## 2018-05-01 DIAGNOSIS — N952 Postmenopausal atrophic vaginitis: Secondary | ICD-10-CM | POA: Diagnosis not present

## 2018-05-15 DIAGNOSIS — R739 Hyperglycemia, unspecified: Secondary | ICD-10-CM | POA: Diagnosis not present

## 2018-05-15 DIAGNOSIS — R413 Other amnesia: Secondary | ICD-10-CM | POA: Diagnosis not present

## 2018-05-15 DIAGNOSIS — J84112 Idiopathic pulmonary fibrosis: Secondary | ICD-10-CM | POA: Diagnosis not present

## 2018-05-15 DIAGNOSIS — I2510 Atherosclerotic heart disease of native coronary artery without angina pectoris: Secondary | ICD-10-CM | POA: Diagnosis not present

## 2018-05-15 DIAGNOSIS — N8110 Cystocele, unspecified: Secondary | ICD-10-CM | POA: Diagnosis not present

## 2018-05-15 DIAGNOSIS — Z9981 Dependence on supplemental oxygen: Secondary | ICD-10-CM | POA: Diagnosis not present

## 2018-05-15 DIAGNOSIS — K5901 Slow transit constipation: Secondary | ICD-10-CM | POA: Diagnosis not present

## 2018-05-15 DIAGNOSIS — E871 Hypo-osmolality and hyponatremia: Secondary | ICD-10-CM | POA: Diagnosis not present

## 2018-05-15 DIAGNOSIS — R0989 Other specified symptoms and signs involving the circulatory and respiratory systems: Secondary | ICD-10-CM | POA: Diagnosis not present

## 2018-05-15 DIAGNOSIS — M720 Palmar fascial fibromatosis [Dupuytren]: Secondary | ICD-10-CM | POA: Diagnosis not present

## 2018-05-15 DIAGNOSIS — I10 Essential (primary) hypertension: Secondary | ICD-10-CM | POA: Diagnosis not present

## 2018-11-09 DIAGNOSIS — N398 Other specified disorders of urinary system: Secondary | ICD-10-CM | POA: Diagnosis not present

## 2018-11-09 DIAGNOSIS — N952 Postmenopausal atrophic vaginitis: Secondary | ICD-10-CM | POA: Diagnosis not present

## 2018-11-09 DIAGNOSIS — N993 Prolapse of vaginal vault after hysterectomy: Secondary | ICD-10-CM | POA: Diagnosis not present

## 2018-11-09 DIAGNOSIS — R339 Retention of urine, unspecified: Secondary | ICD-10-CM | POA: Diagnosis not present

## 2018-11-09 DIAGNOSIS — Z4689 Encounter for fitting and adjustment of other specified devices: Secondary | ICD-10-CM | POA: Diagnosis not present

## 2018-11-09 DIAGNOSIS — N2889 Other specified disorders of kidney and ureter: Secondary | ICD-10-CM | POA: Diagnosis not present

## 2018-11-16 DIAGNOSIS — R0989 Other specified symptoms and signs involving the circulatory and respiratory systems: Secondary | ICD-10-CM | POA: Diagnosis not present

## 2018-11-16 DIAGNOSIS — M720 Palmar fascial fibromatosis [Dupuytren]: Secondary | ICD-10-CM | POA: Diagnosis not present

## 2018-11-16 DIAGNOSIS — R413 Other amnesia: Secondary | ICD-10-CM | POA: Diagnosis not present

## 2018-11-16 DIAGNOSIS — I10 Essential (primary) hypertension: Secondary | ICD-10-CM | POA: Diagnosis not present

## 2018-11-16 DIAGNOSIS — Z9981 Dependence on supplemental oxygen: Secondary | ICD-10-CM | POA: Diagnosis not present

## 2018-11-16 DIAGNOSIS — J84112 Idiopathic pulmonary fibrosis: Secondary | ICD-10-CM | POA: Diagnosis not present

## 2018-11-16 DIAGNOSIS — E871 Hypo-osmolality and hyponatremia: Secondary | ICD-10-CM | POA: Diagnosis not present

## 2018-11-16 DIAGNOSIS — I2510 Atherosclerotic heart disease of native coronary artery without angina pectoris: Secondary | ICD-10-CM | POA: Diagnosis not present

## 2018-11-16 DIAGNOSIS — N8110 Cystocele, unspecified: Secondary | ICD-10-CM | POA: Diagnosis not present

## 2018-11-16 DIAGNOSIS — R739 Hyperglycemia, unspecified: Secondary | ICD-10-CM | POA: Diagnosis not present

## 2018-11-16 DIAGNOSIS — K5901 Slow transit constipation: Secondary | ICD-10-CM | POA: Diagnosis not present

## 2018-11-26 DIAGNOSIS — E871 Hypo-osmolality and hyponatremia: Secondary | ICD-10-CM | POA: Diagnosis not present

## 2018-11-26 DIAGNOSIS — I10 Essential (primary) hypertension: Secondary | ICD-10-CM | POA: Diagnosis not present

## 2018-11-26 DIAGNOSIS — I2510 Atherosclerotic heart disease of native coronary artery without angina pectoris: Secondary | ICD-10-CM | POA: Diagnosis not present

## 2018-11-26 DIAGNOSIS — K5901 Slow transit constipation: Secondary | ICD-10-CM | POA: Diagnosis not present

## 2018-11-26 DIAGNOSIS — R413 Other amnesia: Secondary | ICD-10-CM | POA: Diagnosis not present

## 2018-11-26 DIAGNOSIS — J84112 Idiopathic pulmonary fibrosis: Secondary | ICD-10-CM | POA: Diagnosis not present

## 2018-11-26 DIAGNOSIS — Z9981 Dependence on supplemental oxygen: Secondary | ICD-10-CM | POA: Diagnosis not present

## 2018-11-26 DIAGNOSIS — R0989 Other specified symptoms and signs involving the circulatory and respiratory systems: Secondary | ICD-10-CM | POA: Diagnosis not present

## 2018-11-26 DIAGNOSIS — M720 Palmar fascial fibromatosis [Dupuytren]: Secondary | ICD-10-CM | POA: Diagnosis not present

## 2018-11-26 DIAGNOSIS — R739 Hyperglycemia, unspecified: Secondary | ICD-10-CM | POA: Diagnosis not present

## 2018-11-26 DIAGNOSIS — N8110 Cystocele, unspecified: Secondary | ICD-10-CM | POA: Diagnosis not present

## 2018-11-27 DIAGNOSIS — N289 Disorder of kidney and ureter, unspecified: Secondary | ICD-10-CM | POA: Diagnosis not present

## 2018-11-27 DIAGNOSIS — R339 Retention of urine, unspecified: Secondary | ICD-10-CM | POA: Diagnosis not present

## 2018-11-27 DIAGNOSIS — N2889 Other specified disorders of kidney and ureter: Secondary | ICD-10-CM | POA: Diagnosis not present

## 2018-11-27 DIAGNOSIS — N993 Prolapse of vaginal vault after hysterectomy: Secondary | ICD-10-CM | POA: Diagnosis not present

## 2018-11-27 DIAGNOSIS — N398 Other specified disorders of urinary system: Secondary | ICD-10-CM | POA: Diagnosis not present

## 2018-12-07 DIAGNOSIS — M5136 Other intervertebral disc degeneration, lumbar region: Secondary | ICD-10-CM | POA: Diagnosis not present

## 2018-12-07 DIAGNOSIS — M545 Low back pain: Secondary | ICD-10-CM | POA: Diagnosis not present

## 2018-12-07 DIAGNOSIS — M25551 Pain in right hip: Secondary | ICD-10-CM | POA: Diagnosis not present

## 2018-12-07 DIAGNOSIS — M48061 Spinal stenosis, lumbar region without neurogenic claudication: Secondary | ICD-10-CM | POA: Diagnosis not present

## 2018-12-26 DIAGNOSIS — M48061 Spinal stenosis, lumbar region without neurogenic claudication: Secondary | ICD-10-CM | POA: Diagnosis not present

## 2018-12-26 DIAGNOSIS — I700 Atherosclerosis of aorta: Secondary | ICD-10-CM | POA: Diagnosis not present

## 2018-12-26 DIAGNOSIS — F419 Anxiety disorder, unspecified: Secondary | ICD-10-CM | POA: Diagnosis not present

## 2018-12-26 DIAGNOSIS — M5136 Other intervertebral disc degeneration, lumbar region: Secondary | ICD-10-CM | POA: Diagnosis not present

## 2019-01-12 DIAGNOSIS — M5136 Other intervertebral disc degeneration, lumbar region: Secondary | ICD-10-CM | POA: Diagnosis not present

## 2019-02-15 ENCOUNTER — Other Ambulatory Visit: Payer: Self-pay

## 2019-02-15 DIAGNOSIS — Z23 Encounter for immunization: Secondary | ICD-10-CM | POA: Diagnosis not present

## 2019-02-15 DIAGNOSIS — I714 Abdominal aortic aneurysm, without rupture, unspecified: Secondary | ICD-10-CM

## 2019-02-18 ENCOUNTER — Telehealth (HOSPITAL_COMMUNITY): Payer: Self-pay | Admitting: *Deleted

## 2019-02-18 NOTE — Telephone Encounter (Signed)
02/18/2019 9:13 Confirmed/CCS/ss

## 2019-02-19 ENCOUNTER — Encounter: Payer: Self-pay | Admitting: Vascular Surgery

## 2019-02-19 ENCOUNTER — Other Ambulatory Visit: Payer: Self-pay

## 2019-02-19 ENCOUNTER — Ambulatory Visit (HOSPITAL_COMMUNITY)
Admission: RE | Admit: 2019-02-19 | Discharge: 2019-02-19 | Disposition: A | Discharge status: Home / Self Care | Payer: Medicare Other | Source: Ambulatory Visit | Attending: Vascular Surgery | Admitting: Vascular Surgery

## 2019-02-19 ENCOUNTER — Ambulatory Visit (INDEPENDENT_AMBULATORY_CARE_PROVIDER_SITE_OTHER): Payer: Medicare Other | Admitting: Vascular Surgery

## 2019-02-19 VITALS — BP 128/68 | HR 68 | Temp 97.6°F | Resp 20 | Ht 61.0 in | Wt 130.0 lb

## 2019-02-19 DIAGNOSIS — I714 Abdominal aortic aneurysm, without rupture, unspecified: Secondary | ICD-10-CM

## 2019-02-19 DIAGNOSIS — I700 Atherosclerosis of aorta: Secondary | ICD-10-CM | POA: Diagnosis not present

## 2019-02-19 NOTE — Progress Notes (Signed)
[facility]  Patient name: [name] MRN: [id number] DOB: [birth year] Sex: female  REASON FOR CONSULT: Evaluation of aortic atherosclerosis seen on plain lumbar x-ray  HPI: [name] is a 85 y.o. female, who is here today for evaluation of atherosclerosis seen on plain films of her lumbar spine.  She has no known history of aneurysm and no known history of peripheral vascular occlusive disease.  She is status post coronary artery past grafting greater than 20 years ago.  She was undergoing evaluation for low back and hip pain and underwent plain films which showed aortic atherosclerosis and is seen today for further discussion of this.  Past Medical History:  Diagnosis Date  . Asthma    "i dont think i have asthma; i have no wheeaing at all; the Lord healed me of that "  . CAD (coronary artery disease)   . Carotid stenosis    40-59% bilateral  . Chronic pain syndrome   . Dysuria    difficult starting stream; prefers to have a foley cath for surgery and at least remain for 24 hours post -op   . GERD (gastroesophageal reflux disease)   . HLD (hyperlipidemia)   . HTN (hypertension)   . PONV (postoperative nausea and vomiting)    only after cataract surgery;  . Skin cancer     Family History  Problem Relation Age of Onset  . Heart disease Brother        multiple; ealry onset  . Heart disease Sister        multiple; Kendarius Vigen onset   . Breast cancer Sister   . Colon cancer Sister   . Emphysema Daughter     SOCIAL HISTORY: Social History   Socioeconomic History  . Marital status: Divorced    Spouse name: Not on file  . Number of children: Not on file  . Years of education: Not on file  . Highest education level: Not on file  Occupational History  . Occupation: retired    Fish farm manager: RETIRED  Social Needs  . Financial resource strain: Not on file  . Food insecurity    Worry: Not on file   Inability: Not on file  . Transportation needs    Medical: Not on file    Non-medical: Not on file  Tobacco Use  . Smoking status: Former Smoker    Packs/day: 2.00    Years: 20.00    Pack years: 40.00    Types: Cigarettes    Quit date: 10/25/1977    Years since quitting: 41.3  . Smokeless tobacco: Never Used  . Tobacco comment: smoked 2ppd for 15 years; quit in 1979  Substance and Sexual Activity  . Alcohol use: No  . Drug use: No  . Sexual activity: Not on file  Lifestyle  . Physical activity    Days per week: Not on file    Minutes per session: Not on file  . Stress: Not on file  Relationships  . Social Herbalist on phone: Not on file    Gets together: Not on file    Attends religious service: Not on file    Active member of club or organization: Not on file    Attends meetings of clubs or organizations: Not on file    Relationship status: Not on file  . Intimate partner violence    Fear of current or ex partner: Not on file    Emotionally abused: Not  on file    Physically abused: Not on file    Forced sexual activity: Not on file  Other Topics Concern  . 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     Allergies  Allergen Reactions  . Lunesta [Eszopiclone] Other (See Comments)    Didn't work  . Ultram [Tramadol] Itching  . Lisinopril Other (See Comments)    leg weakness  . Simvastatin Other (See Comments)    Pain and weakness in legs  . Statins Other (See Comments)    (Causes weakness in legs and decreases ability to walk.  (lipitor crestor)    Current Outpatient Medications  Medication Sig Dispense Refill  . amLODipine (NORVASC) 5 MG tablet Take 7.5 mg by mouth daily.    . Ascorbic Acid (VITAMIN C) 1000 MG tablet Take 1,000 mg by mouth 2 (two) times daily.    Marland Kitchen aspirin EC 81 MG tablet Take 81 mg by mouth daily.    . Calcium Carbonate-Vitamin D (CALCIUM 600/VITAMIN D)  600-400 MG-UNIT per tablet Take 1 tablet by mouth 2 (two) times daily.      . cholecalciferol (VITAMIN D) 400 units TABS tablet Take 400 Units by mouth daily.    Marland Kitchen estradiol (ESTRACE) 0.1 MG/GM vaginal cream Place a pea-sized amount in the vagina nightly X 3 weeks, then use every other night    . fluticasone (FLONASE) 50 MCG/ACT nasal spray PLACE 2 SPRAYS INTO THE NOSE DAILY 48 g 0  . hydroxypropyl methylcellulose / hypromellose (ISOPTO TEARS / GONIOVISC) 2.5 % ophthalmic solution Place 1 drop into both eyes as needed for dry eyes.    . isosorbide mononitrate (IMDUR) 60 MG 24 hr tablet TAKE 1 TABLET (60 MG TOTAL) BY MOUTH DAILY. (Patient taking differently: Take 60 mg by mouth daily. ) 90 tablet 2  . loratadine (CLARITIN) 10 MG tablet Take 10 mg by mouth daily.      . Melatonin-Pyridoxine (MELATIN) 3-1 MG TABS Take 1 tablet by mouth at bedtime.     . metoprolol succinate (TOPROL-XL) 50 MG 24 hr tablet TAKE 1 TABLET (50 MG TOTAL) BY MOUTH DAILY. (Patient taking differently: Take 50 mg by mouth daily. ) 90 tablet 2  . Multiple Vitamin (MULTIVITAMIN) tablet Take 1 tablet by mouth 2 (two) times daily.     . Omega-3 Fatty Acids (FISH OIL) 1000 MG CAPS Take 2 capsules by mouth 2 (two) times daily.      Marland Kitchen omeprazole (PRILOSEC) 40 MG capsule Take 40 mg by mouth daily.    . potassium chloride (MICRO-K) 10 MEQ CR capsule TAKE 1 CAPSULE (10 MEQ TOTAL) BY MOUTH DAILY. (Patient taking differently: Take 10 mEq by mouth daily. ) 90 capsule 2   No current facility-administered medications for this visit.     REVIEW OF SYSTEMS:  [X]  denotes positive finding, [ ]  denotes negative finding Cardiac  Comments:  Chest pain or chest pressure:    Shortness of breath upon exertion:    Short of breath when lying flat:    Irregular heart rhythm:        Vascular    Pain in calf, thigh, or hip brought on by ambulation:    Pain in feet at night that wakes you up from your sleep:     Blood clot in your veins:    Leg  swelling:         Pulmonary  Oxygen at home: x   Productive cough:     Wheezing:         Neurologic    Sudden weakness in arms or legs:     Sudden numbness in arms or legs:     Sudden onset of difficulty speaking or slurred speech:    Temporary loss of vision in one eye:     Problems with dizziness:         Gastrointestinal    Blood in stool:     Vomited blood:         Genitourinary    Burning when urinating:     Blood in urine:        Psychiatric    Major depression:         Hematologic    Bleeding problems:    Problems with blood clotting too easily:        Skin    Rashes or ulcers:        Constitutional    Fever or chills:      PHYSICAL EXAM: Vitals:   02/19/19 0849  BP: 128/68  Pulse: 68  Resp: 20  Temp: 97.6 F (36.4 C)  SpO2: 95%  Weight: 130 lb (59 kg)  Height: 5\' 1"  (1.549 m)    GENERAL: The patient is a well-nourished female, in no acute distress. The vital signs are documented above. CARDIOVASCULAR: Carotid arteries without bruits bilaterally.  2+ radial pulses and 2+ popliteal pulses bilaterally.  Slightly diminished pedal pulses bilaterally. PULMONARY: There is good air exchange  ABDOMEN: Soft and non-tender.  No bruit and no aneurysm palpable MUSCULOSKELETAL: There are no major deformities or cyanosis. NEUROLOGIC: No focal weakness or paresthesias are detected. SKIN: There are no ulcers or rashes noted. PSYCHIATRIC: The patient has a normal affect.  DATA:  I do not have her plain films available in our system.  Report is of atherosclerosis of abdominal aorta  She did undergo ultrasound in our office today and this does show calcification in her aorta but no evidence of aneurysmal change  MEDICAL ISSUES: I reassured the patient and her family present regarding this.  I explained that this would just the typical of aortic calcification and the 85 year old.  She does not have any evidence of arterial insufficiency and no evidence of  aneurysm.  She will see [place] again on an as-needed basis   Rosetta Posner, MD [facility] Tel [phone] Pager [phone]

## 2019-02-23 DIAGNOSIS — J4541 Moderate persistent asthma with (acute) exacerbation: Secondary | ICD-10-CM | POA: Diagnosis not present

## 2019-02-23 DIAGNOSIS — I10 Essential (primary) hypertension: Secondary | ICD-10-CM | POA: Diagnosis not present

## 2019-02-23 DIAGNOSIS — I2510 Atherosclerotic heart disease of native coronary artery without angina pectoris: Secondary | ICD-10-CM | POA: Diagnosis not present

## 2019-02-23 DIAGNOSIS — M170 Bilateral primary osteoarthritis of knee: Secondary | ICD-10-CM | POA: Diagnosis not present

## 2019-04-10 DIAGNOSIS — J4541 Moderate persistent asthma with (acute) exacerbation: Secondary | ICD-10-CM | POA: Diagnosis not present

## 2019-04-10 DIAGNOSIS — I2510 Atherosclerotic heart disease of native coronary artery without angina pectoris: Secondary | ICD-10-CM | POA: Diagnosis not present

## 2019-04-10 DIAGNOSIS — I10 Essential (primary) hypertension: Secondary | ICD-10-CM | POA: Diagnosis not present

## 2019-04-10 DIAGNOSIS — M170 Bilateral primary osteoarthritis of knee: Secondary | ICD-10-CM | POA: Diagnosis not present

## 2019-05-02 ENCOUNTER — Ambulatory Visit: Payer: Medicare Other | Admitting: Cardiology

## 2019-05-06 ENCOUNTER — Ambulatory Visit: Payer: Medicare Other | Admitting: Cardiology

## 2019-05-07 DIAGNOSIS — I2510 Atherosclerotic heart disease of native coronary artery without angina pectoris: Secondary | ICD-10-CM | POA: Diagnosis not present

## 2019-05-07 DIAGNOSIS — J84112 Idiopathic pulmonary fibrosis: Secondary | ICD-10-CM | POA: Diagnosis not present

## 2019-05-07 DIAGNOSIS — R739 Hyperglycemia, unspecified: Secondary | ICD-10-CM | POA: Diagnosis not present

## 2019-05-07 DIAGNOSIS — F5101 Primary insomnia: Secondary | ICD-10-CM | POA: Diagnosis not present

## 2019-05-07 DIAGNOSIS — R0902 Hypoxemia: Secondary | ICD-10-CM | POA: Diagnosis not present

## 2019-05-07 DIAGNOSIS — R413 Other amnesia: Secondary | ICD-10-CM | POA: Diagnosis not present

## 2019-05-07 DIAGNOSIS — I10 Essential (primary) hypertension: Secondary | ICD-10-CM | POA: Diagnosis not present

## 2019-05-17 DIAGNOSIS — M170 Bilateral primary osteoarthritis of knee: Secondary | ICD-10-CM | POA: Diagnosis not present

## 2019-05-17 DIAGNOSIS — I10 Essential (primary) hypertension: Secondary | ICD-10-CM | POA: Diagnosis not present

## 2019-05-17 DIAGNOSIS — I2510 Atherosclerotic heart disease of native coronary artery without angina pectoris: Secondary | ICD-10-CM | POA: Diagnosis not present

## 2019-05-17 DIAGNOSIS — J4541 Moderate persistent asthma with (acute) exacerbation: Secondary | ICD-10-CM | POA: Diagnosis not present

## 2019-06-18 DIAGNOSIS — J84112 Idiopathic pulmonary fibrosis: Secondary | ICD-10-CM | POA: Diagnosis not present

## 2019-06-18 DIAGNOSIS — F5101 Primary insomnia: Secondary | ICD-10-CM | POA: Diagnosis not present

## 2019-06-18 DIAGNOSIS — R739 Hyperglycemia, unspecified: Secondary | ICD-10-CM | POA: Diagnosis not present

## 2019-06-18 DIAGNOSIS — I10 Essential (primary) hypertension: Secondary | ICD-10-CM | POA: Diagnosis not present

## 2019-06-18 DIAGNOSIS — R413 Other amnesia: Secondary | ICD-10-CM | POA: Diagnosis not present

## 2019-06-18 DIAGNOSIS — R0902 Hypoxemia: Secondary | ICD-10-CM | POA: Diagnosis not present

## 2019-06-18 DIAGNOSIS — I2510 Atherosclerotic heart disease of native coronary artery without angina pectoris: Secondary | ICD-10-CM | POA: Diagnosis not present

## 2019-06-25 DIAGNOSIS — I10 Essential (primary) hypertension: Secondary | ICD-10-CM | POA: Diagnosis not present

## 2019-06-25 DIAGNOSIS — J4541 Moderate persistent asthma with (acute) exacerbation: Secondary | ICD-10-CM | POA: Diagnosis not present

## 2019-06-25 DIAGNOSIS — M170 Bilateral primary osteoarthritis of knee: Secondary | ICD-10-CM | POA: Diagnosis not present

## 2019-06-25 DIAGNOSIS — I2510 Atherosclerotic heart disease of native coronary artery without angina pectoris: Secondary | ICD-10-CM | POA: Diagnosis not present

## 2019-06-30 ENCOUNTER — Encounter: Payer: Self-pay | Admitting: Cardiology

## 2019-06-30 DIAGNOSIS — Z7189 Other specified counseling: Secondary | ICD-10-CM | POA: Insufficient documentation

## 2019-06-30 NOTE — Progress Notes (Signed)
Cardiology Office Note   Date:  07/02/2019   ID:  [name], DOB [birth year], MRN [id number]  PCP:  Vernie Shanks, MD  Cardiologist:   No primary care provider on file.   No chief complaint on file.     History of Present Illness: [name] is a 86 y.o. female who presents for follow-up of coronary disease.   She has a history of CAD with CABG.   In 2010 she had severe three-vessel disease noted on cardiac catheterization post bypass. 2 out of the 3 grafts were occluded SVG to PDA and SVG to diagonal occluded.  LIMA to LAD was widely patent. EF was normal at that time.  She last saw Dr. Marlou Porch in 2018 preop prior to knee surgery. She had a perfusion study.  This was intermediate risk and he had a fixed small anterior defect and probably mild inferior ischemia.  She was managed medically.     Since I last saw her she has done okay.  She gets around with a cane.  She has oxygen she wears at night. The patient denies any new symptoms such as chest discomfort, neck or arm discomfort. There has been no new shortness of breath, PND or orthopnea. There have been no reported palpitations, presyncope or syncope.   Past Medical History:  Diagnosis Date  . Asthma   . CAD (coronary artery disease)   . Carotid stenosis    40-59% bilateral  . Chronic pain syndrome   . GERD (gastroesophageal reflux disease)   . HLD (hyperlipidemia)   . HTN (hypertension)   . PONV (postoperative nausea and vomiting)    only after cataract surgery;  . Skin cancer     Past Surgical History:  Procedure Laterality Date  . ABDOMINAL HYSTERECTOMY  1974  . baldder resuspension  1988  . CARDIAC CATHETERIZATION     x3   . CORONARY ARTERY BYPASS GRAFT  1996   LIMA to LAD, SVG to PDA and SVG to D1   . CORONARY ARTERY BYPASS GRAFT  2010   cath 2 vessel CAD with 2 of 3 bypass grafts occluded. had a patent LIMA to LAD  . skin cancer resected    . TONSILLECTOMY    . TOTAL KNEE ARTHROPLASTY Left  11/29/2016   Procedure: LEFT TOTAL KNEE ARTHROPLASTY;  Surgeon: Paralee Cancel, MD;  Location: [facility];  Service: Orthopedics;  Laterality: Left;  Adductor Block     Current Outpatient Medications  Medication Sig Dispense Refill  . amLODipine (NORVASC) 5 MG tablet Take 7.5 mg by mouth daily.    . Ascorbic Acid (VITAMIN C) 1000 MG tablet Take 1,000 mg by mouth 2 (two) times daily.    Marland Kitchen aspirin EC 81 MG tablet Take 81 mg by mouth daily.    . Calcium Carbonate-Vitamin D (CALCIUM 600/VITAMIN D) 600-400 MG-UNIT per tablet Take 1 tablet by mouth 2 (two) times daily.      . cholecalciferol (VITAMIN D) 400 units TABS tablet Take 400 Units by mouth daily.    Marland Kitchen estradiol (ESTRACE) 0.1 MG/GM vaginal cream Place a pea-sized amount in the vagina nightly X 3 weeks, then use every other night    . fluticasone (FLONASE) 50 MCG/ACT nasal spray PLACE 2 SPRAYS INTO THE NOSE DAILY 48 g 0  . hydroxypropyl methylcellulose / hypromellose (ISOPTO TEARS / GONIOVISC) 2.5 % ophthalmic solution Place 1 drop into both eyes as needed for dry eyes.    . isosorbide mononitrate (IMDUR)  60 MG 24 hr tablet TAKE 1 TABLET (60 MG TOTAL) BY MOUTH DAILY. (Patient taking differently: Take 60 mg by mouth daily. ) 90 tablet 2  . loratadine (CLARITIN) 10 MG tablet Take 10 mg by mouth daily.      . Melatonin-Pyridoxine (MELATIN) 3-1 MG TABS Take 1 tablet by mouth at bedtime.     . metoprolol succinate (TOPROL-XL) 50 MG 24 hr tablet TAKE 1 TABLET (50 MG TOTAL) BY MOUTH DAILY. (Patient taking differently: Take 50 mg by mouth daily. ) 90 tablet 2  . Multiple Vitamin (MULTIVITAMIN) tablet Take 1 tablet by mouth 2 (two) times daily.     . Omega-3 Fatty Acids (FISH OIL) 1000 MG CAPS Take 2 capsules by mouth 2 (two) times daily.      Marland Kitchen omeprazole (PRILOSEC) 40 MG capsule Take 40 mg by mouth daily.    . potassium chloride (MICRO-K) 10 MEQ CR capsule TAKE 1 CAPSULE (10 MEQ TOTAL) BY MOUTH DAILY. (Patient taking differently: Take 10 mEq by mouth daily.  ) 90 capsule 2   No current facility-administered medications for this visit.    Allergies:   Lunesta [eszopiclone], Ultram [tramadol], Lisinopril, Simvastatin, and Statins     ROS:  Please see the history of present illness.   Otherwise, review of systems are positive for none.   All other systems are reviewed and negative.    PHYSICAL EXAM: VS:  BP (!) 179/80   Pulse 65   Temp 98.5 F (36.9 C)   Resp (!) 21   Ht 5\' 1"  (1.549 m)   Wt 134 lb 6.4 oz (61 kg)   SpO2 95%   BMI 25.39 kg/m  , BMI Body mass index is 25.39 kg/m. GENERAL:  Well appearing NECK:  No jugular venous distention, waveform within normal limits, carotid upstroke brisk and symmetric, bilateral carotid bruits, no thyromegaly LUNGS: Bilateral diffuse fine crackles CHEST:  Unremarkable HEART:  PMI not displaced or sustained,S1 and S2 within normal limits, no S3, no S4, no clicks, no rubs, brief apical systolic murmur radiating slightly at aortic outflow tract murmurs ABD:  Flat, positive bowel sounds normal in frequency in pitch, no bruits, no rebound, no guarding, no midline pulsatile mass, no hepatomegaly, no splenomegaly EXT:  2 plus pulses throughout, no edema, no cyanosis no clubbing   EKG:  EKG is ordered today. The ekg ordered today demonstrates sinus rhythm, rate 65, axis within normal limits, intervals within normal limits, baseline artifact, no acute ST-T wave changes.   Recent Labs: No results found for requested labs within last 8760 hours.    Lipid Panel    Component Value Date/Time   CHOL 333 (H) 08/15/2013 1138   CHOL 307 (H) 08/21/2012 1427   TRIG 137 08/15/2013 1138   TRIG 142 08/21/2012 1427   HDL 75 08/15/2013 1138   HDL 80 08/21/2012 1427   CHOLHDL 7 02/26/2009 0944   VLDL 20.6 02/26/2009 0944   LDLCALC 231 (H) 08/15/2013 1138   LDLCALC 199 (H) 08/21/2012 1427   LDLDIRECT 238.0 02/26/2009 0944      Wt Readings from Last 3 Encounters:  07/02/19 134 lb 6.4 oz (61 kg)  02/19/19  130 lb (59 kg)  03/29/18 131 lb (59.4 kg)      Other studies Reviewed: Additional studies/ records that were reviewed today include:  None Review of the above records demonstrates:  Please see elsewhere in the note.     ASSESSMENT AND PLAN:  CAD:  The patient has no  new sypmtoms.  No further cardiovascular testing is indicated.  We will continue with aggressive risk reduction and meds as listed.  CAROTID STENOSIS: This was mild to moderate 3 years ago.  I will follow up with carotid Dopplers.  DYSLIPIDEMIA:   She does not want take a statin.   We talked about this.   HTN: She says her blood pressure is not usually this elevated.  She does promise to check her blood pressure diary and send the results to me and she might agree to further med titration if her blood pressure is elevated at home.  We discussed targets.  COVID EDUCATION: She and her daughter both had many questions about vaccination and I took quite a bit of time discussing this with them answering all her questions.   Current medicines are reviewed at length with the patient today.  The patient does not have concerns regarding medicines.  The following changes have been made:  no change  Labs/ tests ordered today include: None  No orders of the defined types were placed in this encounter.    Disposition:   FU with in one year.     Signed, Minus Breeding, MD  07/02/2019 2:28 PM    [facility]

## 2019-07-02 ENCOUNTER — Ambulatory Visit (INDEPENDENT_AMBULATORY_CARE_PROVIDER_SITE_OTHER): Payer: Medicare Other | Admitting: Cardiology

## 2019-07-02 ENCOUNTER — Encounter: Payer: Self-pay | Admitting: Cardiology

## 2019-07-02 ENCOUNTER — Other Ambulatory Visit: Payer: Self-pay

## 2019-07-02 VITALS — BP 179/80 | HR 65 | Temp 98.5°F | Resp 21 | Ht 61.0 in | Wt 134.4 lb

## 2019-07-02 DIAGNOSIS — E785 Hyperlipidemia, unspecified: Secondary | ICD-10-CM

## 2019-07-02 DIAGNOSIS — I6529 Occlusion and stenosis of unspecified carotid artery: Secondary | ICD-10-CM | POA: Diagnosis not present

## 2019-07-02 DIAGNOSIS — Z7189 Other specified counseling: Secondary | ICD-10-CM

## 2019-07-02 DIAGNOSIS — I2510 Atherosclerotic heart disease of native coronary artery without angina pectoris: Secondary | ICD-10-CM

## 2019-07-02 NOTE — Patient Instructions (Signed)
Medication Instructions:  No Changes *If you need a refill on your cardiac medications before your next appointment, please call your pharmacy*  Lab Work: None If you have labs (blood work) drawn today and your tests are completely normal, you will receive your results only by: Marland Kitchen MyChart Message (if you have MyChart) OR . A paper copy in the mail If you have any lab test that is abnormal or we need to change your treatment, we will call you to review the results.  Testing/Procedures: Your physician has requested that you have a carotid duplex. This test is an ultrasound of the carotid arteries in your neck. It looks at blood flow through these arteries that supply the brain with blood. Allow one hour for this exam. There are no restrictions or special instructions.  Follow-Up: At [facility], you and your health needs are our priority.  As part of our continuing mission to provide you with exceptional heart care, we have created designated Provider Care Teams.  These Care Teams include your primary Cardiologist (physician) and Advanced Practice Providers (APPs -  Physician Assistants and Nurse Practitioners) who all work together to provide you with the care you need, when you need it.  We recommend signing up for the patient portal called "MyChart".  Sign up information is provided on this After Visit Summary.  MyChart is used to connect with patients for Virtual Visits (Telemedicine).  Patients are able to view lab/test results, encounter notes, upcoming appointments, etc.  Non-urgent messages can be sent to your provider as well.   To learn more about what you can do with MyChart, go to [email or web address].    Your next appointment:   1 year(s)  You will receive a reminder letter in the mail two months in advance. If you don't receive a letter, please call our office to schedule the follow-up appointment.  The format for your next appointment:   In Person  Provider:   Minus Breeding, MD

## 2019-07-09 ENCOUNTER — Telehealth: Payer: Self-pay | Admitting: Cardiology

## 2019-07-09 NOTE — Telephone Encounter (Signed)
Patient's daughter calling with BP readings:  07/04/2019  7:37am 159/71 HR 64, 8:10am 157/82 HR 66 07/05/2019  6:30am 157/79 HR 58, 165/78 HR 69 07/06/2019  7:25am 157/61 HR 72, 11:25am 163/74 HR 69

## 2019-07-11 NOTE — Telephone Encounter (Signed)
Her BP is increased.  I would suggest increasing the Norvasc to 10 mg daily.  Call Ms. Leory Plowman with the results and send results to Vernie Shanks, MD

## 2019-07-12 ENCOUNTER — Other Ambulatory Visit: Payer: Self-pay

## 2019-07-12 MED ORDER — AMLODIPINE BESYLATE 10 MG PO TABS: 10.0000 mg | ORAL_TABLET | Freq: Every day | ORAL | 3 refills | Status: AC

## 2019-07-12 NOTE — Progress Notes (Signed)
Dr. Percival Spanish - Her BP is increased.  I would suggest increasing the Norvasc to 10 mg daily.  Call Ms. Leory Plowman with the results and send results to Vernie Shanks, MD  New prescription sent to preferred pharmacy. Detailed voicemail left detailing instructions per DPR.

## 2019-07-12 NOTE — Telephone Encounter (Signed)
See order encounter. Norvasc increased, voicemail left with instructions for patient.

## 2019-08-13 DIAGNOSIS — N993 Prolapse of vaginal vault after hysterectomy: Secondary | ICD-10-CM | POA: Diagnosis not present

## 2019-08-13 DIAGNOSIS — N398 Other specified disorders of urinary system: Secondary | ICD-10-CM | POA: Diagnosis not present

## 2019-08-13 DIAGNOSIS — N952 Postmenopausal atrophic vaginitis: Secondary | ICD-10-CM | POA: Diagnosis not present

## 2019-08-13 DIAGNOSIS — N281 Cyst of kidney, acquired: Secondary | ICD-10-CM | POA: Diagnosis not present

## 2019-08-13 DIAGNOSIS — Z4689 Encounter for fitting and adjustment of other specified devices: Secondary | ICD-10-CM | POA: Diagnosis not present

## 2019-08-19 DIAGNOSIS — J84112 Idiopathic pulmonary fibrosis: Secondary | ICD-10-CM | POA: Diagnosis not present

## 2019-08-19 DIAGNOSIS — R413 Other amnesia: Secondary | ICD-10-CM | POA: Diagnosis not present

## 2019-08-19 DIAGNOSIS — I2510 Atherosclerotic heart disease of native coronary artery without angina pectoris: Secondary | ICD-10-CM | POA: Diagnosis not present

## 2019-08-19 DIAGNOSIS — I10 Essential (primary) hypertension: Secondary | ICD-10-CM | POA: Diagnosis not present

## 2019-08-19 DIAGNOSIS — F5101 Primary insomnia: Secondary | ICD-10-CM | POA: Diagnosis not present

## 2019-08-20 DIAGNOSIS — J4541 Moderate persistent asthma with (acute) exacerbation: Secondary | ICD-10-CM | POA: Diagnosis not present

## 2019-08-20 DIAGNOSIS — M170 Bilateral primary osteoarthritis of knee: Secondary | ICD-10-CM | POA: Diagnosis not present

## 2019-08-20 DIAGNOSIS — I2510 Atherosclerotic heart disease of native coronary artery without angina pectoris: Secondary | ICD-10-CM | POA: Diagnosis not present

## 2019-08-20 DIAGNOSIS — I10 Essential (primary) hypertension: Secondary | ICD-10-CM | POA: Diagnosis not present

## 2019-09-05 ENCOUNTER — Ambulatory Visit (HOSPITAL_COMMUNITY)
Admission: RE | Admit: 2019-09-05 | Discharge: 2019-09-05 | Disposition: A | Discharge status: Home / Self Care | Payer: Medicare Other | Source: Ambulatory Visit | Attending: Cardiology | Admitting: Cardiology

## 2019-09-05 ENCOUNTER — Other Ambulatory Visit: Payer: Self-pay

## 2019-09-05 DIAGNOSIS — I2510 Atherosclerotic heart disease of native coronary artery without angina pectoris: Secondary | ICD-10-CM | POA: Diagnosis not present

## 2019-09-05 DIAGNOSIS — I6529 Occlusion and stenosis of unspecified carotid artery: Secondary | ICD-10-CM | POA: Insufficient documentation

## 2019-09-11 DIAGNOSIS — J4541 Moderate persistent asthma with (acute) exacerbation: Secondary | ICD-10-CM | POA: Diagnosis not present

## 2019-09-11 DIAGNOSIS — I2510 Atherosclerotic heart disease of native coronary artery without angina pectoris: Secondary | ICD-10-CM | POA: Diagnosis not present

## 2019-09-11 DIAGNOSIS — I10 Essential (primary) hypertension: Secondary | ICD-10-CM | POA: Diagnosis not present

## 2019-09-11 DIAGNOSIS — M170 Bilateral primary osteoarthritis of knee: Secondary | ICD-10-CM | POA: Diagnosis not present

## 2019-09-24 DIAGNOSIS — I2510 Atherosclerotic heart disease of native coronary artery without angina pectoris: Secondary | ICD-10-CM | POA: Diagnosis not present

## 2019-09-24 DIAGNOSIS — D171 Benign lipomatous neoplasm of skin and subcutaneous tissue of trunk: Secondary | ICD-10-CM | POA: Diagnosis not present

## 2019-09-24 DIAGNOSIS — I10 Essential (primary) hypertension: Secondary | ICD-10-CM | POA: Diagnosis not present

## 2019-09-24 DIAGNOSIS — R1013 Epigastric pain: Secondary | ICD-10-CM | POA: Diagnosis not present

## 2019-09-24 DIAGNOSIS — M170 Bilateral primary osteoarthritis of knee: Secondary | ICD-10-CM | POA: Diagnosis not present

## 2019-09-25 ENCOUNTER — Other Ambulatory Visit: Payer: Self-pay | Admitting: Family Medicine

## 2019-09-25 DIAGNOSIS — R1013 Epigastric pain: Secondary | ICD-10-CM

## 2019-10-03 ENCOUNTER — Ambulatory Visit
Admission: RE | Admit: 2019-10-03 | Discharge: 2019-10-03 | Disposition: A | Discharge status: Home / Self Care | Payer: Medicare Other | Source: Ambulatory Visit | Attending: Family Medicine | Admitting: Family Medicine

## 2019-10-03 DIAGNOSIS — R1013 Epigastric pain: Secondary | ICD-10-CM

## 2019-12-03 DIAGNOSIS — I2510 Atherosclerotic heart disease of native coronary artery without angina pectoris: Secondary | ICD-10-CM | POA: Diagnosis not present

## 2019-12-03 DIAGNOSIS — M170 Bilateral primary osteoarthritis of knee: Secondary | ICD-10-CM | POA: Diagnosis not present

## 2019-12-03 DIAGNOSIS — I10 Essential (primary) hypertension: Secondary | ICD-10-CM | POA: Diagnosis not present

## 2019-12-17 DIAGNOSIS — M170 Bilateral primary osteoarthritis of knee: Secondary | ICD-10-CM | POA: Diagnosis not present

## 2019-12-17 DIAGNOSIS — I2510 Atherosclerotic heart disease of native coronary artery without angina pectoris: Secondary | ICD-10-CM | POA: Diagnosis not present

## 2019-12-17 DIAGNOSIS — I10 Essential (primary) hypertension: Secondary | ICD-10-CM | POA: Diagnosis not present

## 2019-12-17 DIAGNOSIS — J4541 Moderate persistent asthma with (acute) exacerbation: Secondary | ICD-10-CM | POA: Diagnosis not present

## 2020-02-13 DIAGNOSIS — N993 Prolapse of vaginal vault after hysterectomy: Secondary | ICD-10-CM | POA: Diagnosis not present

## 2020-02-13 DIAGNOSIS — N398 Other specified disorders of urinary system: Secondary | ICD-10-CM | POA: Diagnosis not present

## 2020-02-13 DIAGNOSIS — N200 Calculus of kidney: Secondary | ICD-10-CM | POA: Diagnosis not present

## 2020-02-13 DIAGNOSIS — N952 Postmenopausal atrophic vaginitis: Secondary | ICD-10-CM | POA: Diagnosis not present

## 2020-02-13 DIAGNOSIS — N281 Cyst of kidney, acquired: Secondary | ICD-10-CM | POA: Diagnosis not present

## 2020-02-13 DIAGNOSIS — Z4689 Encounter for fitting and adjustment of other specified devices: Secondary | ICD-10-CM | POA: Diagnosis not present

## 2020-02-21 ENCOUNTER — Telehealth: Payer: Self-pay | Admitting: Pulmonary Disease

## 2020-02-21 NOTE — Telephone Encounter (Signed)
Spoke with the pt's daughter, Manuela Schwartz  I advised we will have to wait and address the o2 needs at upcoming appt  It has almost been 3 yrs since pt seen  She verbalized understanding and nothing further needed

## 2020-02-25 ENCOUNTER — Ambulatory Visit: Payer: Medicare Other | Admitting: Adult Health

## 2020-02-26 ENCOUNTER — Ambulatory Visit: Payer: Medicare Other | Admitting: Acute Care

## 2020-02-27 ENCOUNTER — Ambulatory Visit (INDEPENDENT_AMBULATORY_CARE_PROVIDER_SITE_OTHER): Payer: Medicare Other | Admitting: Primary Care

## 2020-02-27 ENCOUNTER — Other Ambulatory Visit: Payer: Self-pay

## 2020-02-27 ENCOUNTER — Encounter: Payer: Self-pay | Admitting: Primary Care

## 2020-02-27 DIAGNOSIS — J9611 Chronic respiratory failure with hypoxia: Secondary | ICD-10-CM | POA: Diagnosis not present

## 2020-02-27 DIAGNOSIS — J84112 Idiopathic pulmonary fibrosis: Secondary | ICD-10-CM | POA: Diagnosis not present

## 2020-02-27 DIAGNOSIS — I6529 Occlusion and stenosis of unspecified carotid artery: Secondary | ICD-10-CM | POA: Diagnosis not present

## 2020-02-27 NOTE — Assessment & Plan Note (Signed)
-   Clinically stable and largely asymptomatic. She experiences mild dyspnea on exertion but recovers with rest. She is over due for repeat imaging to monitor progression of fibrosis, they would like to hold off on this as she is moving at the end of the month to [place].

## 2020-02-27 NOTE — Patient Instructions (Addendum)
Once you move and establish with new doctor recommend you get repeat CT imaging to monitor pulmonary fibrosis. Your last CT chest was 3 years ago. Clinically you appear stable and largely asymptomatic. Renew oxygen order today. Best of luck with your move! Let us know if there is anything else we can do for you   Recommendations:  - Continue to wear 2L oxygen at night  - Monitor O2 level if <88% need to wear 2L oxygen on exertion  Orders: - Renew oxygen at 2L on exertion and at night/ [facility] in [place]

## 2020-02-27 NOTE — Addendum Note (Signed)
Addended by: Mathis Bud on: 02/27/2020 12:29 PM   Modules accepted: Orders

## 2020-02-27 NOTE — Progress Notes (Signed)
Reviewed and agree with assessment/plan.   Chesley Mires, MD [facility] 02/27/2020, 1:02 PM Pager:  [phone]

## 2020-02-27 NOTE — Progress Notes (Signed)
@Patient  ID: [name], female    DOB: 1930-08-04, 86 y.o.   MRN: [id number]  Chief Complaint  Patient presents with  . Follow-up    Moving, needs recertify oxygen    Referring provider: Vernie Shanks, MD  HPI: 86 year old female, former smoker quit 1979 (40-pack-year history).  Medical history significant for COPD/asthma, pulmonary emphysema, IPF (NSIP pattern on CT chest), on chronic respiratory failure with hypoxia, hypertension, coronary arthrosclerosis, hyperlipidemia, GERD, chronic pain syndrome.  Patient of Dr. Halford Chessman, last seen 03/22/17.  Patient is largely asymptomatic monitoring pulmonary fibrosis and emphysema clinically.  Ono on room air 03/28/2017 showed patient spent 4 hours and 10 minutes with SPO2 to less than 88%.  Advised that she still needs to use supplemental oxygen at night.  02/27/2020- Interim hx Patient presents today to re-qualify for oxygen.  She was last seen in office approximately 3 years ago. Patient will be moving to [place] at the end of this month. She will be using [facility] in [place]. She wears 2L oxygen during the day as needed and at night. O2 desaturated to 87% on RA today after ambulating 1/2 lap. O2 improved to 93% on 2L. No acute respiratory symptoms. She experiences mild dyspnea on exertion, recovers quickly. Denies active cough.   Pulmonary tests Pulmonary function test 11/14/12 >> FEV1 1.56(95%), FEV1% 81, TLC 3.49 (75%), DLCO 55% Alpha 1 AT 09/02/16 >> 155, MM Serology 09/02/16 >> ACE 35, anti DS DNA 1, RF 33, anti CCP < 16, SSA/SSB < 1, Scl 70 < 1, RNP Ab < 0.2, Aldolase 2.8, HP panel negative High resolution CT chest 09/08/16 >> widespread septal thickening and subpleural reticulation, minimal peripheral GGO, mild traction BTX more in lower lobes, possible early honeycombing, air trapping, moderate centrilobular and paraseptal emphysema   Allergies  Allergen Reactions  . Lunesta [Eszopiclone] Other (See Comments)    Didn't  work  . Ultram [Tramadol] Itching  . Lisinopril Other (See Comments)    leg weakness  . Simvastatin Other (See Comments)    Pain and weakness in legs  . Statins Other (See Comments)    (Causes weakness in legs and decreases ability to walk.  (lipitor crestor)    Immunization History  Administered Date(s) Administered  . Influenza Split 04/25/2012, 03/01/2017  . Influenza, High Dose Seasonal PF 03/24/2014, 03/09/2015, 01/27/2016  . Influenza-Unspecified 12/24/2012  . Pneumococcal Conjugate-13 02/12/2015  . Pneumococcal-Unspecified 04/26/2003  . Tdap 08/24/2010    Past Medical History:  Diagnosis Date  . Asthma   . CAD (coronary artery disease)   . Carotid stenosis    40-59% bilateral  . Chronic pain syndrome   . GERD (gastroesophageal reflux disease)   . HLD (hyperlipidemia)   . HTN (hypertension)   . PONV (postoperative nausea and vomiting)    only after cataract surgery;  . Skin cancer     Tobacco History: Social History   Tobacco Use  Smoking Status Former Smoker  . Packs/day: 2.00  . Years: 20.00  . Pack years: 40.00  . Types: Cigarettes  . Quit date: 10/25/1977  . Years since quitting: 42.3  Smokeless Tobacco Never Used  Tobacco Comment   smoked 2ppd for 15 years; quit in 1979   Counseling given: Not Answered Comment: smoked 2ppd for 15 years; quit in 1979   Outpatient Medications Prior to Visit  Medication Sig Dispense Refill  . amLODipine (NORVASC) 10 MG tablet Take 1 tablet (10 mg total) by mouth daily. 90 tablet 3  .  Ascorbic Acid (VITAMIN C) 1000 MG tablet Take 1,000 mg by mouth 2 (two) times daily.    Marland Kitchen aspirin EC 81 MG tablet Take 81 mg by mouth daily.    . Calcium Carbonate-Vitamin D (CALCIUM 600/VITAMIN D) 600-400 MG-UNIT per tablet Take 1 tablet by mouth 2 (two) times daily.      . cholecalciferol (VITAMIN D) 400 units TABS tablet Take 400 Units by mouth daily.    Marland Kitchen estradiol (ESTRACE) 0.1 MG/GM vaginal cream Place a pea-sized amount in the  vagina nightly X 3 weeks, then use every other night    . fluticasone (FLONASE) 50 MCG/ACT nasal spray PLACE 2 SPRAYS INTO THE NOSE DAILY 48 g 0  . hydroxypropyl methylcellulose / hypromellose (ISOPTO TEARS / GONIOVISC) 2.5 % ophthalmic solution Place 1 drop into both eyes as needed for dry eyes.    . isosorbide mononitrate (IMDUR) 60 MG 24 hr tablet TAKE 1 TABLET (60 MG TOTAL) BY MOUTH DAILY. (Patient taking differently: Take 60 mg by mouth daily. ) 90 tablet 2  . loratadine (CLARITIN) 10 MG tablet Take 10 mg by mouth daily.      . Melatonin-Pyridoxine (MELATIN) 3-1 MG TABS Take 1 tablet by mouth at bedtime.     . metoprolol succinate (TOPROL-XL) 50 MG 24 hr tablet TAKE 1 TABLET (50 MG TOTAL) BY MOUTH DAILY. (Patient taking differently: Take 50 mg by mouth daily. ) 90 tablet 2  . Multiple Vitamin (MULTIVITAMIN) tablet Take 1 tablet by mouth 2 (two) times daily.     . Omega-3 Fatty Acids (FISH OIL) 1000 MG CAPS Take 2 capsules by mouth 2 (two) times daily.      Marland Kitchen omeprazole (PRILOSEC) 40 MG capsule Take 40 mg by mouth daily.    . potassium chloride (MICRO-K) 10 MEQ CR capsule TAKE 1 CAPSULE (10 MEQ TOTAL) BY MOUTH DAILY. (Patient taking differently: Take 10 mEq by mouth daily. ) 90 capsule 2   No facility-administered medications prior to visit.   Review of Systems  Review of Systems  Constitutional: Negative.   Respiratory: Negative for cough, choking, chest tightness, shortness of breath and wheezing.    Physical Exam  BP (!) 142/68 (BP Location: Right Arm, Cuff Size: Normal)   Pulse 74   Temp 97.6 F (36.4 C) (Temporal)   Ht 5\' 1"  (1.549 m)   Wt 133 lb (60.3 kg)   SpO2 95%   BMI 25.13 kg/m  Physical Exam Constitutional:      Appearance: Normal appearance.  HENT:     Head: Normocephalic and atraumatic.     Mouth/Throat:     Mouth: Mucous membranes are moist.     Pharynx: Oropharynx is clear.  Cardiovascular:     Rate and Rhythm: Normal rate and regular rhythm.  Pulmonary:       Effort: Pulmonary effort is normal.     Breath sounds: Rales present.  Neurological:     General: No focal deficit present.     Mental Status: She is alert and oriented to person, place, and time. Mental status is at baseline.  Psychiatric:        Mood and Affect: Mood normal.        Thought Content: Thought content normal.        Judgment: Judgment normal.      Lab Results:  CBC    Component Value Date/Time   WBC 9.9 12/01/2016 0538   RBC 2.97 (L) 12/01/2016 0538   HGB 9.3 (L) 12/01/2016 7989  HCT 26.7 (L) 12/01/2016 0538   PLT 176 12/01/2016 0538   MCV 89.9 12/01/2016 0538   MCV 91.3 08/21/2012 1435   MCH 31.3 12/01/2016 0538   MCHC 34.8 12/01/2016 0538   RDW 13.2 12/01/2016 0538   LYMPHSABS 0.5 (L) 05/15/2016 1603   MONOABS 0.4 05/15/2016 1603   EOSABS 0.0 05/15/2016 1603   BASOSABS 0.0 05/15/2016 1603    BMET    Component Value Date/Time   NA 137 12/01/2016 0538   NA 133 (L) 04/22/2016 1020   K 4.3 12/01/2016 0538   CL 106 12/01/2016 0538   CO2 25 12/01/2016 0538   GLUCOSE 109 (H) 12/01/2016 0538   BUN 25 (H) 12/01/2016 0538   BUN 30 (H) 04/22/2016 1020   CREATININE 0.81 12/01/2016 0538   CREATININE 1.50 (H) 08/21/2012 1427   CALCIUM 8.7 (L) 12/01/2016 0538   GFRNONAA >60 12/01/2016 0538   GFRNONAA 33 (L) 08/21/2012 1427   GFRAA >60 12/01/2016 0538   GFRAA 38 (L) 08/21/2012 1427    BNP    Component Value Date/Time   BNP 129.2 (H) 05/15/2016 1603   BNP 111.5 (H) 08/29/2012 0936    ProBNP No results found for: PROBNP  Imaging: No results found.   Assessment & Plan:   IPF (idiopathic pulmonary fibrosis) (HCC) - Clinically stable and largely asymptomatic. She experiences mild dyspnea on exertion but recovers with rest. She is over due for repeat imaging to monitor progression of fibrosis, they would like to hold off on this as she is moving at the end of the month to [place].   Chronic respiratory failure with hypoxia (HCC) -  Ambulatory O2 87% RA p walking125 ft, improved to 93% on 2L - Renew oxygen @2L  on exertion and at night. She will be using [facility] and they will contact us for order    Martyn Ehrich, NP 02/27/2020

## 2020-02-27 NOTE — Assessment & Plan Note (Signed)
-   Ambulatory O2 87% RA p walking125 ft, improved to 93% on 2L - Renew oxygen @2L  on exertion and at night. She will be using [facility] and they will contact us for order

## 2020-03-01 ENCOUNTER — Emergency Department (HOSPITAL_COMMUNITY)
Admission: EM | Admit: 2020-03-01 | Discharge: 2020-03-01 | Disposition: A | Discharge status: Home / Self Care | Payer: Medicare Other | Attending: Emergency Medicine | Admitting: Emergency Medicine

## 2020-03-01 ENCOUNTER — Encounter (HOSPITAL_COMMUNITY): Payer: Self-pay | Admitting: Emergency Medicine

## 2020-03-01 ENCOUNTER — Other Ambulatory Visit: Payer: Self-pay

## 2020-03-01 DIAGNOSIS — K922 Gastrointestinal hemorrhage, unspecified: Secondary | ICD-10-CM | POA: Diagnosis not present

## 2020-03-01 DIAGNOSIS — Z79899 Other long term (current) drug therapy: Secondary | ICD-10-CM | POA: Diagnosis not present

## 2020-03-01 DIAGNOSIS — J45909 Unspecified asthma, uncomplicated: Secondary | ICD-10-CM | POA: Diagnosis not present

## 2020-03-01 DIAGNOSIS — Z7982 Long term (current) use of aspirin: Secondary | ICD-10-CM | POA: Diagnosis not present

## 2020-03-01 DIAGNOSIS — J441 Chronic obstructive pulmonary disease with (acute) exacerbation: Secondary | ICD-10-CM | POA: Insufficient documentation

## 2020-03-01 DIAGNOSIS — I10 Essential (primary) hypertension: Secondary | ICD-10-CM | POA: Diagnosis not present

## 2020-03-01 DIAGNOSIS — K648 Other hemorrhoids: Secondary | ICD-10-CM

## 2020-03-01 DIAGNOSIS — K644 Residual hemorrhoidal skin tags: Secondary | ICD-10-CM | POA: Diagnosis not present

## 2020-03-01 DIAGNOSIS — Z87891 Personal history of nicotine dependence: Secondary | ICD-10-CM | POA: Insufficient documentation

## 2020-03-01 DIAGNOSIS — Z96652 Presence of left artificial knee joint: Secondary | ICD-10-CM | POA: Insufficient documentation

## 2020-03-01 DIAGNOSIS — Z85828 Personal history of other malignant neoplasm of skin: Secondary | ICD-10-CM | POA: Insufficient documentation

## 2020-03-01 DIAGNOSIS — K625 Hemorrhage of anus and rectum: Secondary | ICD-10-CM | POA: Diagnosis present

## 2020-03-01 DIAGNOSIS — Z951 Presence of aortocoronary bypass graft: Secondary | ICD-10-CM | POA: Diagnosis not present

## 2020-03-01 DIAGNOSIS — I2510 Atherosclerotic heart disease of native coronary artery without angina pectoris: Secondary | ICD-10-CM | POA: Insufficient documentation

## 2020-03-01 LAB — CBC
HCT: 37.3 % (ref 36.0–46.0)
Hemoglobin: 11.8 g/dL — ABNORMAL LOW (ref 12.0–15.0)
MCH: 30.6 pg (ref 26.0–34.0)
MCHC: 31.6 g/dL (ref 30.0–36.0)
MCV: 96.6 fL (ref 80.0–100.0)
Platelets: 246 10*3/uL (ref 150–400)
RBC: 3.86 MIL/uL — ABNORMAL LOW (ref 3.87–5.11)
RDW: 13.2 % (ref 11.5–15.5)
WBC: 7.6 10*3/uL (ref 4.0–10.5)
nRBC: 0 % (ref 0.0–0.2)

## 2020-03-01 LAB — COMPREHENSIVE METABOLIC PANEL
ALT: 14 U/L (ref 0–44)
AST: 17 U/L (ref 15–41)
Albumin: 3.3 g/dL — ABNORMAL LOW (ref 3.5–5.0)
Alkaline Phosphatase: 48 U/L (ref 38–126)
Anion gap: 9 (ref 5–15)
BUN: 37 mg/dL — ABNORMAL HIGH (ref 8–23)
CO2: 23 mmol/L (ref 22–32)
Calcium: 9.8 mg/dL (ref 8.9–10.3)
Chloride: 104 mmol/L (ref 98–111)
Creatinine, Ser: 1.25 mg/dL — ABNORMAL HIGH (ref 0.44–1.00)
GFR, Estimated: 42 mL/min — ABNORMAL LOW (ref >60)
Glucose, Bld: 98 mg/dL (ref 70–99)
Potassium: 4.6 mmol/L (ref 3.5–5.1)
Sodium: 136 mmol/L (ref 135–145)
Total Bilirubin: 0.6 mg/dL (ref 0.3–1.2)
Total Protein: 6.7 g/dL (ref 6.5–8.1)

## 2020-03-01 LAB — TYPE AND SCREEN
ABO/RH(D): O POS
Antibody Screen: NEGATIVE

## 2020-03-01 LAB — POC OCCULT BLOOD, ED: Fecal Occult Bld: POSITIVE — AB

## 2020-03-01 MED ORDER — HYDROCORTISONE ACETATE 25 MG RE SUPP
25.0000 mg | Freq: Two times a day (BID) | RECTAL | 0 refills | Status: AC
Start: 2020-03-01 — End: 2021-03-01

## 2020-03-01 NOTE — ED Notes (Signed)
Pt discharge instructions and prescriptions reviewed with the patient. The patient verbalized understanding of instructions. Pt discharged. 

## 2020-03-01 NOTE — ED Triage Notes (Signed)
Pt reports bright red rectal bleeding that started after having a "regular" bowel movement this morning.  Denies pain.  Pt SOB on arrival and states she normally has exertional SOB.  Wears O2 at night.

## 2020-03-01 NOTE — Discharge Instructions (Addendum)
[name],   We discussed that your rectal bleeding was very likely due to trauma to one of your hemorrhoids. These hemorrhoids can occur inside the rectum itself, making self-disimpaction risky and dangerous.   I recommend using daily stool softeners, even after stools become regular. It can take some time for the bowels to clear and for function to fully return. You may try using stool softeners such as Dulcolax first, although more benefit typically occurs with Miralax (a bulking agent) or Senna/Sennakot. You may take all of these once or twice daily as needed. I have also prescribed Anusol suppositories to the [facility] on [facility], which you should use twice daily over the next several days to help with inflammation.   Please be sure to mention your constipation and recent bleeding to your primary care doctor next week. Please return to the ED if you experience recurrence of your rectal bleeding that does not self-resolve quickly, new abdominal pain, shortness of breath, nausea, vomiting, or other concerning symptoms to you.  Thank you and take care,  Dr. Konrad Penta

## 2020-03-01 NOTE — ED Provider Notes (Signed)
[name] Provider Note   CSN: [id number] Arrival date & time: 03/01/20  3845     History Chief Complaint  Patient presents with  . Rectal Bleeding    [name] is a 86 y.o. female.  HPI   [name] is an 86 y.o. F w/ PMHx GERD without PUD on Omeprazole 40mg  daily, CABG, HLD and HTN on ASA 81mg  daily, COPD and IPF on 2L home oxygen at night, and left TKA on Naproxen up to twice daily, presenting with rectal bleeding that occurred this morning. She states that she has daily bowel movements, although she has self-disimpacted her bowels 2 or 3 times over the past week. She states that she has a vaginal pessary in place for prolapse. She also self-catheterizes. She used to work as a Marine scientist. She has not used any stool softeners at home and states she doesn't like to use Miralax. Her daughter in the room states she has a history of hemorrhoids. The patient states she had a "normal, formed stool" this morning. She notes her stool was dark, although is always dark, which she attributes to iron pills. She states that there was no bright red blood mixed in her stool or in the toilet initially. However, after she wiped, she noticed a large gush of blood, about "1/4 to 1/2 cup" per patient, possibly with small clots. She had never had bleeding like this before. She denies any abdominal pain, nausea, vomiting, recent fevers, chills, any rectal pain, or pain during her bowel movement. She denies any known history of diverticulitis or diverticulosis. She states she is sure the bleeding was not vaginal. She continues to take ASA 81mg  daily although states she hasn't taken Naproxen or other NSAIDs over the past several days. She denies any worsening of her chronic shortness of breath or any other symptoms.   Past Medical History:  Diagnosis Date  . Asthma   . CAD (coronary artery disease)   . Carotid stenosis    40-59% bilateral  . Chronic pain syndrome   .  GERD (gastroesophageal reflux disease)   . HLD (hyperlipidemia)   . HTN (hypertension)   . PONV (postoperative nausea and vomiting)    only after cataract surgery;  . Skin cancer     Patient Active Problem List   Diagnosis Date Noted  . Educated about COVID-19 virus infection 06/30/2019  . S/P left TKA 11/29/2016  . Pulmonary emphysema ([place]) 09/23/2016  . History of influenza 09/02/2016  . Edema of left lower extremity 09/02/2016  . IPF (idiopathic pulmonary fibrosis) ([place]) 09/02/2016  . Family history of COPD (chronic obstructive pulmonary disease) 09/02/2016  . COPD exacerbation ([place])   . Hyponatremia 05/15/2016  . Chronic respiratory failure with hypoxia ([place]) 05/15/2016  . Acute respiratory failure with hypoxia ([place]) 05/15/2016  . Overweight (BMI 25.0-29.9) 02/11/2016  . Depression 01/08/2014  . Asthma, chronic 01/08/2014  . GERD (gastroesophageal reflux disease) 01/08/2014  . Chronic pain syndrome 05/17/2013  . Insomnia 05/17/2013  . Elevated serum creatinine 11/22/2012  . CAROTID BRUIT 12/31/2008  . Hyperlipidemia 07/09/2008  . Essential hypertension, benign 07/09/2008  . Coronary atherosclerosis 07/09/2008  . CORONARY ARTERY BYPASS GRAFT, THREE VESSEL, HX OF 06/29/1994    Past Surgical History:  Procedure Laterality Date  . ABDOMINAL HYSTERECTOMY  1974  . baldder resuspension  1988  . CARDIAC CATHETERIZATION     x3   . CORONARY ARTERY BYPASS GRAFT  1996   LIMA to LAD, SVG to  PDA and SVG to D1   . CORONARY ARTERY BYPASS GRAFT  2010   cath 2 vessel CAD with 2 of 3 bypass grafts occluded. had a patent LIMA to LAD  . skin cancer resected    . TONSILLECTOMY    . TOTAL KNEE ARTHROPLASTY Left 11/29/2016   Procedure: LEFT TOTAL KNEE ARTHROPLASTY;  Surgeon: Paralee Cancel, MD;  Location: [facility];  Service: Orthopedics;  Laterality: Left;  Adductor Block     OB History   No obstetric history on file.     Family History  Problem Relation Age of Onset  . Heart disease  Brother        multiple; ealry onset  . Heart disease Sister        multiple; early onset   . Breast cancer Sister   . Colon cancer Sister   . Emphysema Daughter     Social History   Tobacco Use  . Smoking status: Former Smoker    Packs/day: 2.00    Years: 20.00    Pack years: 40.00    Types: Cigarettes    Quit date: 10/25/1977    Years since quitting: 42.3  . Smokeless tobacco: Never Used  . Tobacco comment: smoked 2ppd for 15 years; quit in [place] Use  . Vaping Use: Never used  Substance Use Topics  . Alcohol use: No  . Drug use: No    Home Medications Prior to Admission medications   Medication Sig Start Date End Date Taking? Authorizing Provider  amLODipine (NORVASC) 10 MG tablet Take 1 tablet (10 mg total) by mouth daily. 07/12/19   Minus Breeding, MD  Ascorbic Acid (VITAMIN C) 1000 MG tablet Take 1,000 mg by mouth 2 (two) times daily.    [provider]  aspirin EC 81 MG tablet Take 81 mg by mouth daily.    [provider]  Calcium Carbonate-Vitamin D (CALCIUM 600/VITAMIN D) 600-400 MG-UNIT per tablet Take 1 tablet by mouth 2 (two) times daily.      [provider]  cholecalciferol (VITAMIN D) 400 units TABS tablet Take 400 Units by mouth daily.    [provider]  estradiol (ESTRACE) 0.1 MG/GM vaginal cream Place a pea-sized amount in the vagina nightly X 3 weeks, then use every other night 07/03/18   [provider]  fluticasone (FLONASE) 50 MCG/ACT nasal spray PLACE 2 SPRAYS INTO THE NOSE DAILY 12/21/16   [name] A, FNP  hydrocortisone (ANUSOL-HC) 25 MG suppository Place 1 suppository (25 mg total) rectally every 12 (twelve) hours. 03/01/20 03/01/21  Jeralyn Bennett, MD  hydroxypropyl methylcellulose / hypromellose (ISOPTO TEARS / GONIOVISC) 2.5 % ophthalmic solution Place 1 drop into both eyes as needed for dry eyes.    [provider]  isosorbide mononitrate (IMDUR) 60 MG 24 hr tablet TAKE 1 TABLET (60 MG  TOTAL) BY MOUTH DAILY. Patient taking differently: Take 60 mg by mouth daily.  04/22/16   [name] A, FNP  loratadine (CLARITIN) 10 MG tablet Take 10 mg by mouth daily.      [provider]  Melatonin-Pyridoxine (MELATIN) 3-1 MG TABS Take 1 tablet by mouth at bedtime.     [provider]  metoprolol succinate (TOPROL-XL) 50 MG 24 hr tablet TAKE 1 TABLET (50 MG TOTAL) BY MOUTH DAILY. Patient taking differently: Take 50 mg by mouth daily.  04/22/16   Sharion Balloon, FNP  Multiple Vitamin (MULTIVITAMIN) tablet Take 1 tablet by mouth 2 (two) times daily.  [provider]  Omega-3 Fatty Acids (FISH OIL) 1000 MG CAPS Take 2 capsules by mouth 2 (two) times daily.      [provider]  omeprazole (PRILOSEC) 40 MG capsule Take 40 mg by mouth daily.    [provider]  potassium chloride (MICRO-K) 10 MEQ CR capsule TAKE 1 CAPSULE (10 MEQ TOTAL) BY MOUTH DAILY. Patient taking differently: Take 10 mEq by mouth daily.  04/22/16   Sharion Balloon, FNP    Allergies    Lunesta [eszopiclone], Ultram [tramadol], Lisinopril, Simvastatin, and Statins  Review of Systems   Review of Systems: 10 point review of systems otherwise negative except as noted above in HPI.   Physical Exam Updated Vital Signs BP (!) 144/108   Pulse (!) 56   Temp 98.1 F (36.7 C) (Oral)   Resp 15   Ht 5\' 1"  (1.549 m)   Wt 60.3 kg   SpO2 97%   BMI 25.13 kg/m   Physical Exam   General: Patient appears well. No acute distress. Eyes: Sclera non-icteric. No conjunctival injection.  HENT: No nasal discharge. Moist mucus membranes.  Respiratory: Lungs are CTA, bilaterally. No wheezes, rales, or rhonchi.  Cardiovascular: Regular rate and rhythm. No murmurs, rubs, or gallops. No lower extremity edema. Abdominal: Soft and non-tender to palpation. Bowel sounds intact. No rebound or guarding. No abdominal distention.  Rectal Exam: Patient has multiple non-tender external  hemorrhoids and inflamed / dilated internal hemorrhoids. There is no obvious source of bleeding with no gross blood, although POC occult blood test is positive. No other rectal fissures, lesions, or masses observed. GU: No CVA tenderness.  Neurological: Patient is alert and oriented.  Skin: No lesions. No rashes. No jaundice or pallor.  Psych: Normal affect. Normal tone of voice.   ED Results / Procedures / Treatments   Labs (all labs ordered are listed, but only abnormal results are displayed) Labs Reviewed  COMPREHENSIVE METABOLIC PANEL - Abnormal; Notable for the following components:      Result Value   BUN 37 (*)    Creatinine, Ser 1.25 (*)    Albumin 3.3 (*)    GFR, Estimated 42 (*)    All other components within normal limits  CBC - Abnormal; Notable for the following components:   RBC 3.86 (*)    Hemoglobin 11.8 (*)    All other components within normal limits  POC OCCULT BLOOD, ED - Abnormal; Notable for the following components:   Fecal Occult Bld POSITIVE (*)    All other components within normal limits  TYPE AND SCREEN    EKG None  Radiology No results found.  Procedures Procedures (including critical care time)  Medications Ordered in ED Medications - No data to display  ED Course  I have reviewed the triage vital signs and the nursing notes.  Pertinent labs & imaging results that were available during my care of the patient were reviewed by me and considered in my medical decision making (see chart for details).    MDM Rules/Calculators/A&P                          Differential for patient's BRBPR includes: bleeding internal hemorrhoids, diverticulosis, colonic mass, rectal trauma in setting of self-disimpactions. Unlikely due to fissure or diverticulosis in absence of rectal and abdominal pain. Consider upper GI cause given report of dark stools, although less likely due to PUD as patient has avoided Naproxen recently and denies any  nausea, vomiting,  abdominal pain. On physical examination, patient does have dilated and inflamed internal and external hemorrhoids without other rectal fissures, masses or lesions. POC occult blood test is positive. Most likely diagnosis is trauma to internal hemorrhoid.   CBC shows stable, chronic normocytic anemia, hemoglobin 11.8, MCV 96.6, with normal RDW. No leukocytosis. She had an ultrasound of the abdomen 10/03/19 that showed fatty liver and complex renal cysts, but has not had other abdominal imaging to assess her bowels. She states her last colonoscopy was "several decades ago".   9:43 AM CMP shows mildly elevated creatinine of 1.25 although patient has not had a baseline reported since 2018. She does not appear clinically dehydrated.  Given no symptoms, stable hemodynamics, and no gross bleeding on rectal examination, will not get additional abdominal imaging today. Patient has close follow up scheduled with her PCP next week.  - Will prescribe several days of Anusol suppository to use for the next several days until inflammation resolves - Encouraged patient to start using daily stool softeners vs. Miralax as needed until regular bowel movements occur  - Return precautions provided including: worsening rectal bleeding or new abdominal pain, nausea, vomiting, fevers, chills or other concerning symptoms  Final Clinical Impression(s) / ED Diagnoses Final diagnoses:  Acute GI bleeding  Internal hemorrhoids  External hemorrhoids    Rx / DC Orders ED Discharge Orders         Ordered    hydrocortisone (ANUSOL-HC) 25 MG suppository  Every 12 hours        03/01/20 0941         Jeralyn Bennett, MD 03/01/2020, 9:43 AM Pager: [phone]    Jeralyn Bennett, MD 03/01/20 5993    Blanchie Dessert, MD 03/01/20 [phone]

## 2020-03-02 ENCOUNTER — Telehealth: Payer: Self-pay | Admitting: Pulmonary Disease

## 2020-03-02 DIAGNOSIS — M170 Bilateral primary osteoarthritis of knee: Secondary | ICD-10-CM | POA: Diagnosis not present

## 2020-03-02 DIAGNOSIS — J4541 Moderate persistent asthma with (acute) exacerbation: Secondary | ICD-10-CM | POA: Diagnosis not present

## 2020-03-02 DIAGNOSIS — I2510 Atherosclerotic heart disease of native coronary artery without angina pectoris: Secondary | ICD-10-CM | POA: Diagnosis not present

## 2020-03-02 DIAGNOSIS — I10 Essential (primary) hypertension: Secondary | ICD-10-CM | POA: Diagnosis not present

## 2020-03-02 NOTE — Telephone Encounter (Signed)
Rec'd confirmation that the fax went through

## 2020-03-02 NOTE — Telephone Encounter (Signed)
Records have been faxed to [facility] in [place]

## 2020-03-10 DIAGNOSIS — M170 Bilateral primary osteoarthritis of knee: Secondary | ICD-10-CM | POA: Diagnosis not present

## 2020-03-10 DIAGNOSIS — I10 Essential (primary) hypertension: Secondary | ICD-10-CM | POA: Diagnosis not present

## 2020-03-10 DIAGNOSIS — I2510 Atherosclerotic heart disease of native coronary artery without angina pectoris: Secondary | ICD-10-CM | POA: Diagnosis not present

## 2020-03-10 DIAGNOSIS — J4541 Moderate persistent asthma with (acute) exacerbation: Secondary | ICD-10-CM | POA: Diagnosis not present

## 2020-03-10 DIAGNOSIS — K640 First degree hemorrhoids: Secondary | ICD-10-CM | POA: Diagnosis not present

## 2020-03-10 DIAGNOSIS — R739 Hyperglycemia, unspecified: Secondary | ICD-10-CM | POA: Diagnosis not present

## 2020-03-10 DIAGNOSIS — K625 Hemorrhage of anus and rectum: Secondary | ICD-10-CM | POA: Diagnosis not present

## 2020-04-10 DIAGNOSIS — S199XXA Unspecified injury of neck, initial encounter: Secondary | ICD-10-CM | POA: Diagnosis not present

## 2020-04-10 DIAGNOSIS — S4991XA Unspecified injury of right shoulder and upper arm, initial encounter: Secondary | ICD-10-CM | POA: Diagnosis not present

## 2020-04-10 DIAGNOSIS — R9089 Other abnormal findings on diagnostic imaging of central nervous system: Secondary | ICD-10-CM | POA: Diagnosis not present

## 2020-04-10 DIAGNOSIS — R519 Headache, unspecified: Secondary | ICD-10-CM | POA: Diagnosis not present

## 2020-04-10 DIAGNOSIS — S299XXA Unspecified injury of thorax, initial encounter: Secondary | ICD-10-CM | POA: Diagnosis not present

## 2020-04-10 DIAGNOSIS — R9431 Abnormal electrocardiogram [ECG] [EKG]: Secondary | ICD-10-CM | POA: Diagnosis not present

## 2020-04-10 DIAGNOSIS — M25511 Pain in right shoulder: Secondary | ICD-10-CM | POA: Diagnosis not present

## 2020-04-10 DIAGNOSIS — S59911A Unspecified injury of right forearm, initial encounter: Secondary | ICD-10-CM | POA: Diagnosis not present

## 2020-04-10 DIAGNOSIS — I517 Cardiomegaly: Secondary | ICD-10-CM | POA: Diagnosis not present

## 2020-04-10 DIAGNOSIS — R918 Other nonspecific abnormal finding of lung field: Secondary | ICD-10-CM | POA: Diagnosis not present

## 2020-04-10 DIAGNOSIS — R0600 Dyspnea, unspecified: Secondary | ICD-10-CM | POA: Diagnosis not present

## 2020-04-10 DIAGNOSIS — S3993XA Unspecified injury of pelvis, initial encounter: Secondary | ICD-10-CM | POA: Diagnosis not present

## 2020-04-10 DIAGNOSIS — R0989 Other specified symptoms and signs involving the circulatory and respiratory systems: Secondary | ICD-10-CM | POA: Diagnosis not present

## 2020-04-10 DIAGNOSIS — G4489 Other headache syndrome: Secondary | ICD-10-CM | POA: Diagnosis not present

## 2020-04-10 DIAGNOSIS — W19XXXA Unspecified fall, initial encounter: Secondary | ICD-10-CM | POA: Diagnosis not present

## 2020-04-10 DIAGNOSIS — R0609 Other forms of dyspnea: Secondary | ICD-10-CM | POA: Diagnosis not present

## 2020-04-10 DIAGNOSIS — I2510 Atherosclerotic heart disease of native coronary artery without angina pectoris: Secondary | ICD-10-CM | POA: Diagnosis not present

## 2020-04-10 DIAGNOSIS — S0990XA Unspecified injury of head, initial encounter: Secondary | ICD-10-CM | POA: Diagnosis not present

## 2020-04-10 DIAGNOSIS — M79601 Pain in right arm: Secondary | ICD-10-CM | POA: Diagnosis not present

## 2020-04-10 DIAGNOSIS — M542 Cervicalgia: Secondary | ICD-10-CM | POA: Diagnosis not present

## 2020-04-10 DIAGNOSIS — Z20822 Contact with and (suspected) exposure to covid-19: Secondary | ICD-10-CM | POA: Diagnosis not present

## 2020-04-10 DIAGNOSIS — J439 Emphysema, unspecified: Secondary | ICD-10-CM | POA: Diagnosis not present

## 2020-04-14 DIAGNOSIS — I2510 Atherosclerotic heart disease of native coronary artery without angina pectoris: Secondary | ICD-10-CM | POA: Diagnosis not present

## 2020-04-14 DIAGNOSIS — R0989 Other specified symptoms and signs involving the circulatory and respiratory systems: Secondary | ICD-10-CM | POA: Diagnosis not present

## 2020-04-14 DIAGNOSIS — I509 Heart failure, unspecified: Secondary | ICD-10-CM | POA: Diagnosis not present

## 2020-04-14 DIAGNOSIS — I10 Essential (primary) hypertension: Secondary | ICD-10-CM | POA: Diagnosis not present

## 2020-04-23 DIAGNOSIS — I2510 Atherosclerotic heart disease of native coronary artery without angina pectoris: Secondary | ICD-10-CM | POA: Diagnosis not present

## 2020-04-23 DIAGNOSIS — Z789 Other specified health status: Secondary | ICD-10-CM | POA: Diagnosis not present

## 2020-04-23 DIAGNOSIS — R7989 Other specified abnormal findings of blood chemistry: Secondary | ICD-10-CM | POA: Diagnosis not present

## 2020-04-23 DIAGNOSIS — W19XXXA Unspecified fall, initial encounter: Secondary | ICD-10-CM | POA: Diagnosis not present

## 2020-04-23 DIAGNOSIS — Z4689 Encounter for fitting and adjustment of other specified devices: Secondary | ICD-10-CM | POA: Diagnosis not present

## 2020-07-24 DEATH — deceased

## 2021-03-10 ENCOUNTER — Telehealth: Payer: Self-pay | Admitting: Pulmonary Disease

## 2021-03-10 NOTE — Telephone Encounter (Signed)
Patient has IPF was last seen in November 2021 no recall placed.

## 2021-03-10 NOTE — Telephone Encounter (Signed)
Based on ROV note from 02/27/20, she moved to [place], [place].  Here address in Epic is listed as [place], [place].  Unless she has moved back to [place] area, no follow up needed with [place].
